# Patient Record
Sex: Male | Born: 1935 | Race: White | Hispanic: No | Smoking: Current every day smoker
Health system: Southern US, Community
[De-identification: ages and names within clinical notes are randomized; demographics above are authoritative.]

## PROBLEM LIST (undated history)

## (undated) DIAGNOSIS — N184 Chronic kidney disease, stage 4 (severe): Secondary | ICD-10-CM

## (undated) DIAGNOSIS — I10 Essential (primary) hypertension: Secondary | ICD-10-CM

## (undated) DIAGNOSIS — N289 Disorder of kidney and ureter, unspecified: Secondary | ICD-10-CM

## (undated) DIAGNOSIS — J449 Chronic obstructive pulmonary disease, unspecified: Secondary | ICD-10-CM

## (undated) DIAGNOSIS — I739 Peripheral vascular disease, unspecified: Secondary | ICD-10-CM

## (undated) HISTORY — DX: Peripheral vascular disease, unspecified: I73.9

## (undated) HISTORY — DX: Chronic obstructive pulmonary disease, unspecified: J44.9

## (undated) HISTORY — DX: Essential (primary) hypertension: I10

---

## 2008-10-10 ENCOUNTER — Ambulatory Visit: Payer: Self-pay | Admitting: Surgery

## 2009-02-19 ENCOUNTER — Emergency Department: Payer: Self-pay | Admitting: Emergency Medicine

## 2009-02-26 ENCOUNTER — Ambulatory Visit: Payer: Self-pay | Admitting: Surgery

## 2009-03-05 ENCOUNTER — Ambulatory Visit: Payer: Self-pay | Admitting: Surgery

## 2009-12-08 ENCOUNTER — Ambulatory Visit: Payer: Self-pay | Admitting: Unknown Physician Specialty

## 2010-11-27 ENCOUNTER — Ambulatory Visit: Payer: Self-pay | Admitting: Ophthalmology

## 2010-12-10 ENCOUNTER — Ambulatory Visit: Payer: Self-pay | Admitting: Ophthalmology

## 2012-11-15 ENCOUNTER — Ambulatory Visit: Payer: Self-pay | Admitting: Family Medicine

## 2013-01-19 ENCOUNTER — Observation Stay: Payer: Self-pay | Admitting: Family Medicine

## 2013-01-19 LAB — CBC
HCT: 31.6 % — ABNORMAL LOW (ref 40.0–52.0)
HGB: 11 g/dL — ABNORMAL LOW (ref 13.0–18.0)
MCHC: 34.7 g/dL (ref 32.0–36.0)
MCV: 91 fL (ref 80–100)
RDW: 12.6 % (ref 11.5–14.5)

## 2013-01-19 LAB — TROPONIN I: Troponin-I: <0.02 ng/mL

## 2013-01-19 LAB — BASIC METABOLIC PANEL
Anion Gap: 8 (ref 7–16)
Calcium, Total: 8.9 mg/dL (ref 8.5–10.1)
Co2: 25 mmol/L (ref 21–32)
Creatinine: 2.32 mg/dL — ABNORMAL HIGH (ref 0.60–1.30)
EGFR (African American): 30 — ABNORMAL LOW
Osmolality: 267 (ref 275–301)
Potassium: 4.6 mmol/L (ref 3.5–5.1)
Sodium: 130 mmol/L — ABNORMAL LOW (ref 136–145)

## 2013-01-19 LAB — PRO B NATRIURETIC PEPTIDE: B-Type Natriuretic Peptide: 299 pg/mL (ref 0–450)

## 2013-01-20 LAB — BASIC METABOLIC PANEL
BUN: 35 mg/dL — ABNORMAL HIGH (ref 7–18)
Calcium, Total: 8.8 mg/dL (ref 8.5–10.1)
Chloride: 99 mmol/L (ref 98–107)
Creatinine: 2.38 mg/dL — ABNORMAL HIGH (ref 0.60–1.30)
EGFR (African American): 29 — ABNORMAL LOW
EGFR (Non-African Amer.): 25 — ABNORMAL LOW
Potassium: 4.8 mmol/L (ref 3.5–5.1)
Sodium: 131 mmol/L — ABNORMAL LOW (ref 136–145)

## 2013-01-20 LAB — CBC WITH DIFFERENTIAL/PLATELET
Basophil %: 0.6 %
HGB: 10.8 g/dL — ABNORMAL LOW (ref 13.0–18.0)
MCHC: 33.8 g/dL (ref 32.0–36.0)
Monocyte %: 0.8 %
Neutrophil #: 6.6 10*3/uL — ABNORMAL HIGH (ref 1.4–6.5)
Platelet: 180 10*3/uL (ref 150–440)
RBC: 3.51 10*6/uL — ABNORMAL LOW (ref 4.40–5.90)
WBC: 7.2 10*3/uL (ref 3.8–10.6)

## 2013-01-20 LAB — TROPONIN I
Troponin-I: <0.02 ng/mL
Troponin-I: <0.02 ng/mL

## 2013-01-20 LAB — LIPID PANEL
HDL Cholesterol: 46 mg/dL (ref 40–60)
Ldl Cholesterol, Calc: 75 mg/dL (ref 0–100)
Triglycerides: 62 mg/dL (ref 0–200)
VLDL Cholesterol, Calc: 12 mg/dL (ref 5–40)

## 2013-01-20 LAB — SODIUM, URINE, RANDOM: Sodium, Urine Random: 99 mmol/L (ref 20–110)

## 2014-11-23 NOTE — H&P (Signed)
PATIENT NAME:  Max Ellis, Max Ellis MR#:  [id number] DATE OF BIRTH:  January 30, 1934  DATE OF ADMISSION:  01/19/2013  PRIMARY CARE PHYSICIAN:  Irven Easterly. Kary Kos, MD  CHIEF COMPLAINT:  Chest pain.   HISTORY OF PRESENT ILLNESS:  This is a 81 year old man who presents to the ER with chest pain going on every night around 4:00 p.m. as a trembling-type feeling in his chest. Last night, it was very severe in nature, 10 out of 10 in intensity. He has been trying to take some Maalox that did not help. He has not been eating much. His legs are more swollen. He has been sweating. In the ER, he had a troponin that was negative. Hospitalist services were contacted for further evaluation.   PAST MEDICAL HISTORY:  Chronic kidney disease, stage III, edema, hypertension, glaucoma, chronic back pain.   PAST SURGICAL HISTORY:  Two hernia repairs.   ALLERGIES:  No known drug allergies.   MEDICATIONS:  Include Xalatan 1 drop each eye at bedtime, Artificial Tears p.r.n., atenolol 25 mg at bedtime, Lasix 20 mg daily, Flomax 0.4 mg daily, Tylenol extra strength p.r.n., vitamin D2 50,000 units 1 capsule every month.   SOCIAL HISTORY:  Smokes 1 pack per day since age 42. No alcohol. No drug use. Retired.   FAMILY HISTORY:  Father died of a CVA and COPD. Mother died of congestive heart failure.   REVIEW OF SYSTEMS:  CONSTITUTIONAL: Positive for sweating. Positive for a few pound weight gain. No fever or chills. Positive for fatigue.  EYES: He does wear glasses and has glaucoma.  EARS, NOSE, MOUTH AND THROAT: Positive for runny nose. No sore throat. No difficulty swallowing.  CARDIOVASCULAR: Positive for chest pain.  RESPIRATORY: Positive for shortness of breath, coughing up yellowish phlegm.  GASTROINTESTINAL: Positive for constipation. No nausea. No vomiting. No abdominal pain.  GENITOURINARY: Positive for a urine infection a few weeks ago.  MUSCULOSKELETAL: No joint pain or muscle pain.  INTEGUMENT: No rashes or eruptions.   NEUROLOGIC: No fainting or blackouts.  PSYCHIATRIC: No anxiety or depression.  ENDOCRINE: No thyroid problems.  HEMATOLOGIC/LYMPHATIC: No anemia.   PHYSICAL EXAMINATION: VITAL SIGNS: Temperature 98.3, pulse 56, respirations 18, blood pressure 155/60, pulse oximetry 98% on room air.  GENERAL: No respiratory distress.  EYES: Conjunctivae and lids normal. Pupils equal, round, and reactive to light. Extraocular muscles intact. No nystagmus.  EARS, NOSE, MOUTH AND THROAT: Tympanic membranes: No erythema. Nasal mucosa: No erythema. Throat: No erythema. No exudate seen. Lips and gums: No lesions.  NECK: No JVD. No bruits. No lymphadenopathy. No thyromegaly. No thyroid nodules palpated.  RESPIRATORY: Decreased breath sounds bilaterally. Positive wheeze throughout entire lung field. No use of accessory muscles to breathe.  CARDIOVASCULAR: S1, S2 soft. No gallops, rubs or murmurs heard. Carotid upstroke 2+ bilaterally. No bruits.  EXTREMITIES: Dorsalis pedis pulses 1+ bilaterally, 3+ edema bilateral lower extremities.  ABDOMEN: Soft, nontender. No organosplenomegaly. Normoactive bowel sounds. No masses felt.  CHEST WALL: Positive pain to palpation over the xiphoid process.  LYMPHATIC: No lymph nodes in the neck.  MUSCULOSKELETAL: With 3+ edema. No clubbing. No cyanosis.  SKIN: Chronic lower extremity discoloration of bilateral lower extremities.  NEUROLOGICAL: Cranial nerves II through XII grossly intact. Deep tendon reflexes 2+ bilateral lower extremities.  PSYCHIATRIC: The patient is oriented to person, place and time.   LABORATORY AND RADIOLOGICAL DATA:  White blood cell count 9.7, H and H 11.0 and 31.6, platelet count 188. Glucose is 86, BUN 31, creatinine 2.32, sodium  130, potassium 4.6, chloride 97, CO2 is 25, calcium 8.9. Liver function tests are not done. GFR is 26. Troponin is negative. BNP is 299. EKG: Normal sinus rhythm, 78 beats per minute.   ASSESSMENT AND PLAN:   1.  Chest pain. We  will admit as an observation, get serial cardiac enzymes to rule out myocardial infarction and will get an echocardiogram. I will hold off on a stress test at this point. I think the patient's chest pain is secondary to his xiphoid process. We will give a trial of Solu-Medrol and reevaluate tomorrow.  2.  Chronic obstructive pulmonary disease exacerbation and tobacco abuse. Smoking cessation counseling done 3 minutes by me. Nicotine patch applied. We will give a dose of Solu-Medrol 125 mg intravenously STAT and 40 mg intravenously q. 8 hours, start DuoNeb nebulizer solution and Advair and reevaluate things tomorrow.  3.  Edema of the lower extremity. I do not believe that this is heart failure, but I will obtain an echocardiogram. His Lasix dose is too small for his creatinine. We will give a dose of intravenous Lasix now and increase his dose to 40 mg daily.  4.  Insomnia. We will give a trial of Remeron.  5.  Chronic kidney disease, stage IV. Continue to monitor as outpatient.  6.  Hyponatremia. We will recheck again tomorrow a.m. and send off a urine random sodium.   TIME SPENT ON ADMISSION:  55 minutes.    ____________________________ Tana Conch. Leslye Peer, MD rjw:si D: 01/19/2013 21:13:00 ET T: 01/19/2013 21:34:00 ET JOB#: [id number]  cc: Tana Conch. Leslye Peer, MD, <Dictator> Marisue Brooklyn MD ELECTRONICALLY SIGNED 01/30/2013 15:31

## 2014-11-23 NOTE — Discharge Summary (Signed)
PATIENT NAME:  Max Ellis, Max Ellis MR#:  [id number] DATE OF BIRTH:  12/27/1933  DATE OF ADMISSION:  01/19/2013 DATE OF DISCHARGE:  01/20/2013  ADMISSION DIAGNOSIS: Chest pain, mostly related to xiphoid process tenderness.   DISCHARGE  DIAGNOSES.  1.  Atypical chest pain.  2.  History of chronic obstructive pulmonary disease.  3.  Current smoker.  4.  Chronic kidney disease.  5.  Glaucoma.  6.  Blind in the left eye.  7.  Hypertension.  8.  History of headaches.  9.  Benign prostatic hypertrophy.  10.  Edema.   HOSPITAL COURSE: The patient is a very nice 81 year old gentleman who has a history of chronic kidney disease, stage III, chronic edema due to chronic kidney disease, hypertension, glaucoma, chronic back pain. He came on 01/19/2013 with a chief complaint of going every night around 4 p.m. with significant chest pain with a trembling feeling over chest, and his pain has been okay, tolerable up until the day of admission. The patient had severe 10 out of 10 pain. He was trying to take some Maalox and it did not help. He was not eating much. His legs were having significant edema because he was not taking his medications right. He had an evaluation showing negative troponin.   His pain was completely reproducible with mobilization of the xiphoid process. The patient was admitted for observation. Cardiac enzymes were done to rule out a myocardial infarction. They were negative.   Overall, results showed a glucose of 140, a creatinine of 2.38, which is baseline for him. Sodium of 131, which is due to his chronic kidney disease. Troponins were negative x 3. Hemoglobin was 11. White count was 9.7.   EKG: Normal sinus rhythm with no significant ST elevation or depression other than mostly depolarization changes in comparison with previous EKGs.   The patient was given steroids and his pain relieved completely. Apparently he has not been sleeping very well due to cough and several other issues, for  which the patient was given Remeron the day of admission. Remeron did not help very much, for which I recommended starting with Benadryl at home.   He has significant cramps of his lower extremities. His potassium has been on the side of 4.5, 4.8. He has been told by Dr. Juleen China to have a low-potassium diet. I told him that if he is going to be taking Lasix, and with increase of the dose he might have mustard or a banana occasionally if he is having severe cramps.   I asked him also to follow-up with Dr. Juleen China for that. His Lasix was increased due to his severe edema to 40 mg once a day instead of 20.  He has also been given a prescription for Advair as he does not take any inhalers for COPD. He is a smoker, and smoking cessation counseling was given to him for over 5 minutes.   The patient was discharged in good condition. Steroids did help, for which we have him a prescription for 3 days of prednisone. His chest pain was overall musculoskeletal.   The patient is to follow up with Dr. Kary Kos and Dr. Juleen China.   I spent about 45 minutes with this patient on discharge   ____________________________ Easton Sink, MD rsg:dm D: 01/22/2013 07:07:16 ET T: 01/22/2013 11:12:42 ET JOB#: [id number]  cc:  Sink, MD, <Dictator> Irven Easterly. Kary Kos, MD Mamie Levers, MD [place] MD ELECTRONICALLY SIGNED 01/30/2013 6:42

## 2016-06-09 ENCOUNTER — Other Ambulatory Visit: Payer: Self-pay | Admitting: Family Medicine

## 2016-06-09 DIAGNOSIS — R1031 Right lower quadrant pain: Secondary | ICD-10-CM

## 2016-06-22 ENCOUNTER — Ambulatory Visit
Admission: RE | Admit: 2016-06-22 | Discharge: 2016-06-22 | Disposition: A | Discharge status: Home / Self Care | Payer: Medicare Other | Source: Ambulatory Visit | Attending: Family Medicine | Admitting: Family Medicine

## 2016-06-22 DIAGNOSIS — N400 Enlarged prostate without lower urinary tract symptoms: Secondary | ICD-10-CM | POA: Diagnosis not present

## 2016-06-22 DIAGNOSIS — R1031 Right lower quadrant pain: Secondary | ICD-10-CM | POA: Insufficient documentation

## 2016-06-22 DIAGNOSIS — N329 Bladder disorder, unspecified: Secondary | ICD-10-CM | POA: Diagnosis not present

## 2016-10-21 ENCOUNTER — Ambulatory Visit: Payer: Self-pay

## 2017-01-26 ENCOUNTER — Ambulatory Visit (INDEPENDENT_AMBULATORY_CARE_PROVIDER_SITE_OTHER): Payer: Medicare Other | Admitting: Vascular Surgery

## 2017-01-26 ENCOUNTER — Encounter (INDEPENDENT_AMBULATORY_CARE_PROVIDER_SITE_OTHER): Payer: Self-pay | Admitting: Vascular Surgery

## 2017-01-26 VITALS — BP 140/72 | HR 63 | Resp 15 | Ht 74.0 in | Wt 159.0 lb

## 2017-01-26 DIAGNOSIS — F17200 Nicotine dependence, unspecified, uncomplicated: Secondary | ICD-10-CM | POA: Insufficient documentation

## 2017-01-26 DIAGNOSIS — F17210 Nicotine dependence, cigarettes, uncomplicated: Secondary | ICD-10-CM | POA: Diagnosis not present

## 2017-01-26 DIAGNOSIS — M79604 Pain in right leg: Secondary | ICD-10-CM | POA: Diagnosis not present

## 2017-01-26 DIAGNOSIS — M79605 Pain in left leg: Secondary | ICD-10-CM

## 2017-01-26 DIAGNOSIS — I10 Essential (primary) hypertension: Secondary | ICD-10-CM | POA: Insufficient documentation

## 2017-01-26 DIAGNOSIS — M79609 Pain in unspecified limb: Secondary | ICD-10-CM | POA: Insufficient documentation

## 2017-01-26 NOTE — Patient Instructions (Signed)
Peripheral Vascular Disease Peripheral vascular disease (PVD) is a disease of the blood vessels that are not part of your heart and brain. A simple term for PVD is poor circulation. In most cases, PVD narrows the blood vessels that carry blood from your heart to the rest of your body. This can result in a decreased supply of blood to your arms, legs, and internal organs, like your stomach or kidneys. However, it most often affects a person's lower legs and feet. There are two types of PVD.  Organic PVD. This is the more common type. It is caused by damage to the structure of blood vessels.  Functional PVD. This is caused by conditions that make blood vessels contract and tighten (spasm).  Without treatment, PVD tends to get worse over time. PVD can also lead to acute ischemic limb. This is when an arm or limb suddenly has trouble getting enough blood. This is a medical emergency. What are the causes? Each type of PVD has many different causes. The most common cause of PVD is buildup of a fatty material (plaque) inside of your arteries (atherosclerosis). Small amounts of plaque can break off from the walls of the blood vessels and become lodged in a smaller artery. This blocks blood flow and can cause acute ischemic limb. Other common causes of PVD include:  Blood clots that form inside of blood vessels.  Injuries to blood vessels.  Diseases that cause inflammation of blood vessels or cause blood vessel spasms.  Health behaviors and health history that increase your risk of developing PVD.  What increases the risk? You may have a greater risk of PVD if you:  Have a family history of PVD.  Have certain medical conditions, including: ? High cholesterol. ? Diabetes. ? High blood pressure (hypertension). ? Coronary heart disease. ? Past problems with blood clots. ? Past injury, such as burns or a broken bone. These may have damaged blood vessels in your limbs. ? Buerger disease. This is  caused by inflamed blood vessels in your hands and feet. ? Some forms of arthritis. ? Rare birth defects that affect the arteries in your legs.  Use tobacco.  Do not get enough exercise.  Are obese.  Are age 50 or older.  What are the signs or symptoms? PVD may cause many different symptoms. Your symptoms depend on what part of your body is not getting enough blood. Some common signs and symptoms include:  Cramps in your lower legs. This may be a symptom of poor leg circulation (claudication).  Pain and weakness in your legs while you are physically active that goes away when you rest (intermittent claudication).  Leg pain when at rest.  Leg numbness, tingling, or weakness.  Coldness in a leg or foot, especially when compared with the other leg.  Skin or hair changes. These can include: ? Hair loss. ? Shiny skin. ? Pale or bluish skin. ? Thick toenails.  Inability to get or maintain an erection (erectile dysfunction).  People with PVD are more prone to developing ulcers and sores on their toes, feet, or legs. These may take longer than normal to heal. How is this diagnosed? Your health care provider may diagnose PVD from your signs and symptoms. The health care provider will also do a physical exam. You may have tests to find out what is causing your PVD and determine its severity. Tests may include:  Blood pressure recordings from your arms and legs and measurements of the strength of your pulses (  pulse volume recordings).  Imaging studies using sound waves to take pictures of the blood flow through your blood vessels (Doppler ultrasound).  Injecting a dye into your blood vessels before having imaging studies using: ? X-rays (angiogram or arteriogram). ? Computer-generated X-rays (CT angiogram). ? A powerful electromagnetic field and a computer (magnetic resonance angiogram or MRA).  How is this treated? Treatment for PVD depends on the cause of your condition and the  severity of your symptoms. It also depends on your age. Underlying causes need to be treated and controlled. These include long-lasting (chronic) conditions, such as diabetes, high cholesterol, and high blood pressure. You may need to first try making lifestyle changes and taking medicines. Surgery may be needed if these do not work. Lifestyle changes may include:  Quitting smoking.  Exercising regularly.  Following a low-fat, low-cholesterol diet.  Medicines may include:  Blood thinners to prevent blood clots.  Medicines to improve blood flow.  Medicines to improve your blood cholesterol levels.  Surgical procedures may include:  A procedure that uses an inflated balloon to open a blocked artery and improve blood flow (angioplasty).  A procedure to put in a tube (stent) to keep a blocked artery open (stent implant).  Surgery to reroute blood flow around a blocked artery (peripheral bypass surgery).  Surgery to remove dead tissue from an infected wound on the affected limb.  Amputation. This is surgical removal of the affected limb. This may be necessary in cases of acute ischemic limb that are not improved through medical or surgical treatments.  Follow these instructions at home:  Take medicines only as directed by your health care provider.  Do not use any tobacco products, including cigarettes, chewing tobacco, or electronic cigarettes. If you need help quitting, ask your health care provider.  Lose weight if you are overweight, and maintain a healthy weight as directed by your health care provider.  Eat a diet that is low in fat and cholesterol. If you need help, ask your health care provider.  Exercise regularly. Ask your health care provider to suggest some good activities for you.  Use compression stockings or other mechanical devices as directed by your health care provider.  Take good care of your feet. ? Wear comfortable shoes that fit well. ? Check your feet  often for any cuts or sores. Contact a health care provider if:  You have cramps in your legs while walking.  You have leg pain when you are at rest.  You have coldness in a leg or foot.  Your skin changes.  You have erectile dysfunction.  You have cuts or sores on your feet that are not healing. Get help right away if:  Your arm or leg turns cold and blue.  Your arms or legs become red, warm, swollen, painful, or numb.  You have chest pain or trouble breathing.  You suddenly have weakness in your face, arm, or leg.  You become very confused or lose the ability to speak.  You suddenly have a very bad headache or lose your vision. This information is not intended to replace advice given to you by your health care provider. Make sure you discuss any questions you have with your health care provider. Document Released: 08/27/2004 Document Revised: 12/26/2015 Document Reviewed: 12/28/2013 Elsevier Interactive Patient Education  2017 [employer]  

## 2017-01-26 NOTE — Progress Notes (Signed)
Patient ID: Max Ellis, male   DOB: 05-06-1934, 83 y.o.   MRN: [id number]  Chief Complaint  Patient presents with  . New Evaluation    Bilateral leg Ellis    HPI Max Ellis is a 83 y.o. male.  I am asked to see the patient by Dr. Kary Kos for evaluation of leg Ellis.  The patient reports Ellis in his legs that makes him unable to walk more than about 50-75 feet. Both lower extremities are affected. He says that at times they ache and burn and only dangling them off of the bed or chair will help. He says he has to sit down and rest after only short distances of walking. This has been gradually worsening over months without a clear inciting event or causative factor. He has previously been checked but it has been about 40 years ago or so as best he can tell. He has multiple atherosclerotic risk factors including ongoing tobacco use. He says the legs are affected about the same in terms of the left and the right. The Ellis is predominantly in the calf and lower leg area.   Past Medical History:  Diagnosis Date  . COPD (chronic obstructive pulmonary disease) ([place])   . Hypertension   . Peripheral vascular disease ([place])     No past surgical history on file.  Family History No bleeding disorders, clotting disorders, autoimmune diseases, or aneurysms  Social History Social History  Substance Use Topics  . Smoking status: Current Every Day Smoker  . Smokeless tobacco: Never Used  . Alcohol use No  No IVDU  No Known Allergies  Current Outpatient Prescriptions  Medication Sig Dispense Refill  . amLODipine (NORVASC) 2.5 MG tablet Take by mouth.    Marland Kitchen aspirin EC 81 MG tablet Take by mouth.    . calcitRIOL (ROCALTROL) 0.25 MCG capsule TAKE 1 CAPSULE BY MOUTH THREE TIMES A WEEK TAKE MONDAY, WEDNESDAY AND FRIDAY  6  . doxycycline (VIBRAMYCIN) 100 MG capsule Take by mouth.    . finasteride (PROSCAR) 5 MG tablet Take 5 mg by mouth daily.  11  . Fluticasone-Salmeterol (ADVAIR DISKUS) 250-50  MCG/DOSE AEPB Inhale into the lungs.    . furosemide (LASIX) 40 MG tablet Take 40 mg by mouth 2 (two) times daily.  6  . tamsulosin (FLOMAX) 0.4 MG CAPS capsule TAKE 1 CAPSULE BY MOUTH ONCE DAILY. TAKE 30 MINUTES AFTER SAME MEAL EACH DAY.    . traZODone (DESYREL) 50 MG tablet Take by mouth.    . Vitamin D, Ergocalciferol, (DRISDOL) 50000 units CAPS capsule Take by mouth.     No current facility-administered medications for this visit.       REVIEW OF SYSTEMS (Negative unless checked)  Constitutional: [] Weight loss  [] Fever  [] Chills Cardiac: [] Chest Ellis   [] Chest pressure   [] Palpitations   [] Shortness of breath when laying flat   [] Shortness of breath at rest   [] Shortness of breath with exertion. Vascular:  [x] Ellis in legs with walking   [] Ellis in legs at rest   [] Ellis in legs when laying flat   [x] Claudication   [] Ellis in feet when walking  [] Ellis in feet at rest  [] Ellis in feet when laying flat   [] History of DVT   [] Phlebitis   [] Swelling in legs   [] Varicose veins   [] Non-healing ulcers Pulmonary:   [] Uses home oxygen   [] Productive cough   [] Hemoptysis   [] Wheeze  [x] COPD   [] Asthma Neurologic:  [] Dizziness  []   Blackouts   [] Seizures   [] History of stroke   [] History of TIA  [] Aphasia   [] Temporary blindness   [] Dysphagia   [] Weakness or numbness in arms   [] Weakness or numbness in legs Musculoskeletal:  [x] Arthritis   [] Joint swelling   [] Joint Ellis   [x] Low back Ellis Hematologic:  [] Easy bruising  [] Easy bleeding   [] Hypercoagulable state   [] Anemic  [] Hepatitis Gastrointestinal:  [] Blood in stool   [] Vomiting blood  [] Gastroesophageal reflux/heartburn   [] Abdominal Ellis Genitourinary:  [x] Chronic kidney disease   [] Difficult urination  [] Frequent urination  [] Burning with urination   [] Hematuria Skin:  [] Rashes   [] Ulcers   [] Wounds Psychological:  [] History of anxiety   []  History of major depression.    Physical Exam BP 140/72 (BP Location: Right Arm)   Pulse 63   Resp 15    Ht 6\' 2"  (1.88 m)   Wt 159 lb (72.1 kg)   BMI 20.41 kg/m  Gen:  WD/WN, NAD Head: [place]/AT, No temporalis wasting. Prominent temp pulse not noted. Ear/Nose/Throat: Hearing grossly intact, nares w/o erythema or drainage, oropharynx w/o Erythema/Exudate Eyes: Conjunctiva clear, sclera non-icteric  Neck: trachea midline.  No JVD.  Pulmonary:  Good air movement, clear to auscultation bilaterally.  Cardiac: RRR, normal S1, S2, no Murmurs, rubs or gallops. Vascular:  Vessel Right Left  Radial Palpable Palpable                      Popliteal Not Palpable Not Palpable  PT Trace Palpable 1+ Palpable  DP Trace Palpable Not Palpable   Gastrointestinal: soft, non-tender/non-distended.  Musculoskeletal: M/S 5/5 throughout.   No deformity or atrophy. Feet are warm. Trace lower extremity edema. Neurologic: Sensation grossly intact in extremities.  Symmetrical.  Speech is fluent. Motor exam as listed above. Psychiatric: Judgment intact, Mood & affect appropriate for pt's clinical situation. Dermatologic: No rashes or ulcers noted.  No cellulitis or open wounds.    Radiology No results found.  Labs No results found for this or any previous visit (from the past 2160 hour(s)).  Assessment/Plan:  Essential hypertension blood pressure control important in reducing the progression of atherosclerotic disease. On appropriate oral medications.   Tobacco use disorder We had a discussion for approximately 3-4 minutes regarding the absolute need for smoking cessation due to the deleterious nature of tobacco on the vascular system. We discussed the tobacco use would diminish patency of any intervention, and likely significantly worsen progressio of disease. We discussed multiple agents for quitting including replacement therapy or medications to reduce cravings such as Chantix. The patient voices their understanding of the importance of smoking cessation.   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  Recommend:  Patient should undergo arterial studies of the lower extremity ASAP because there has been a significant deterioration in the patient's lower extremity symptoms.  The patient states they are having increased Ellis and a marked decrease in the distance that they can walk.  The risks and benefits as well as the alternatives were discussed in detail with the patient.  All questions were answered.  Patient agrees to proceed and understands this could be a prelude to angiography and intervention.  The patient will follow up with me in the office to review the studies.       Max Ellis 01/26/2017, 4:31  PM   This note was created with Dragon medical transcription system.  Any errors from dictation are unintentional.

## 2017-01-26 NOTE — Assessment & Plan Note (Signed)
The patient has lower extremity symptoms consistent with short distance claudication although he does have a history of back disease and neurogenic claudication certainly has to be a possibility as well.  Recommend:  Patient should undergo arterial studies of the lower extremity ASAP because there has been a significant deterioration in the patient's lower extremity symptoms.  The patient states they are having increased pain and a marked decrease in the distance that they can walk.  The risks and benefits as well as the alternatives were discussed in detail with the patient.  All questions were answered.  Patient agrees to proceed and understands this could be a prelude to angiography and intervention.  The patient will follow up with me in the office to review the studies.

## 2017-01-26 NOTE — Assessment & Plan Note (Signed)
blood pressure control important in reducing the progression of atherosclerotic disease. On appropriate oral medications.  

## 2017-01-26 NOTE — Assessment & Plan Note (Signed)
We had a discussion for approximately 3-4 minutes regarding the absolute need for smoking cessation due to the deleterious nature of tobacco on the vascular system. We discussed the tobacco use would diminish patency of any intervention, and likely significantly worsen progressio of disease. We discussed multiple agents for quitting including replacement therapy or medications to reduce cravings such as Chantix. The patient voices their understanding of the importance of smoking cessation.  

## 2017-02-09 ENCOUNTER — Ambulatory Visit (INDEPENDENT_AMBULATORY_CARE_PROVIDER_SITE_OTHER): Payer: Medicare Other | Admitting: Vascular Surgery

## 2017-02-09 ENCOUNTER — Encounter (INDEPENDENT_AMBULATORY_CARE_PROVIDER_SITE_OTHER): Payer: Medicare Other

## 2017-03-03 ENCOUNTER — Ambulatory Visit (INDEPENDENT_AMBULATORY_CARE_PROVIDER_SITE_OTHER): Payer: Medicare Other

## 2017-03-03 DIAGNOSIS — M79605 Pain in left leg: Secondary | ICD-10-CM

## 2017-03-03 DIAGNOSIS — M79604 Pain in right leg: Secondary | ICD-10-CM | POA: Diagnosis not present

## 2017-03-04 ENCOUNTER — Encounter (INDEPENDENT_AMBULATORY_CARE_PROVIDER_SITE_OTHER): Payer: Self-pay | Admitting: Vascular Surgery

## 2017-06-09 ENCOUNTER — Encounter: Payer: Self-pay | Admitting: *Deleted

## 2017-06-09 ENCOUNTER — Emergency Department: Payer: Medicare Other

## 2017-06-09 ENCOUNTER — Emergency Department
Admission: EM | Admit: 2017-06-09 | Discharge: 2017-06-09 | Disposition: A | Discharge status: Home / Self Care | Payer: Medicare Other | Attending: Emergency Medicine | Admitting: Emergency Medicine

## 2017-06-09 DIAGNOSIS — K5900 Constipation, unspecified: Secondary | ICD-10-CM | POA: Diagnosis not present

## 2017-06-09 DIAGNOSIS — J449 Chronic obstructive pulmonary disease, unspecified: Secondary | ICD-10-CM | POA: Diagnosis not present

## 2017-06-09 DIAGNOSIS — I10 Essential (primary) hypertension: Secondary | ICD-10-CM | POA: Insufficient documentation

## 2017-06-09 DIAGNOSIS — R059 Cough, unspecified: Secondary | ICD-10-CM

## 2017-06-09 DIAGNOSIS — F17200 Nicotine dependence, unspecified, uncomplicated: Secondary | ICD-10-CM | POA: Insufficient documentation

## 2017-06-09 DIAGNOSIS — J189 Pneumonia, unspecified organism: Secondary | ICD-10-CM | POA: Insufficient documentation

## 2017-06-09 DIAGNOSIS — J181 Lobar pneumonia, unspecified organism: Secondary | ICD-10-CM

## 2017-06-09 DIAGNOSIS — R339 Retention of urine, unspecified: Secondary | ICD-10-CM | POA: Diagnosis not present

## 2017-06-09 DIAGNOSIS — R05 Cough: Secondary | ICD-10-CM

## 2017-06-09 LAB — URINALYSIS, COMPLETE (UACMP) WITH MICROSCOPIC
BACTERIA UA: NONE SEEN
Bilirubin Urine: NEGATIVE
GLUCOSE, UA: NEGATIVE mg/dL
HGB URINE DIPSTICK: NEGATIVE
Ketones, ur: NEGATIVE mg/dL
Leukocytes, UA: NEGATIVE
NITRITE: NEGATIVE
Protein, ur: NEGATIVE mg/dL
SPECIFIC GRAVITY, URINE: 1.006 (ref 1.005–1.030)
Squamous Epithelial / LPF: NONE SEEN
WBC, UA: NONE SEEN WBC/hpf (ref 0–5)
pH: 5 (ref 5.0–8.0)

## 2017-06-09 LAB — CBC
HCT: 34.2 % — ABNORMAL LOW (ref 40.0–52.0)
Hemoglobin: 11.6 g/dL — ABNORMAL LOW (ref 13.0–18.0)
MCH: 30.8 pg (ref 26.0–34.0)
MCHC: 33.8 g/dL (ref 32.0–36.0)
MCV: 91.2 fL (ref 80.0–100.0)
Platelets: 150 10*3/uL (ref 150–440)
RBC: 3.75 MIL/uL — ABNORMAL LOW (ref 4.40–5.90)
RDW: 13.3 % (ref 11.5–14.5)
WBC: 8.1 10*3/uL (ref 3.8–10.6)

## 2017-06-09 LAB — BASIC METABOLIC PANEL
Anion gap: 10 (ref 5–15)
BUN: 35 mg/dL — AB (ref 6–20)
CALCIUM: 8.6 mg/dL — AB (ref 8.9–10.3)
CO2: 21 mmol/L — ABNORMAL LOW (ref 22–32)
Chloride: 99 mmol/L — ABNORMAL LOW (ref 101–111)
Creatinine, Ser: 2.48 mg/dL — ABNORMAL HIGH (ref 0.61–1.24)
GFR calc Af Amer: 26 mL/min — ABNORMAL LOW (ref >60)
GFR, EST NON AFRICAN AMERICAN: 23 mL/min — AB (ref >60)
GLUCOSE: 99 mg/dL (ref 65–99)
Potassium: 4.2 mmol/L (ref 3.5–5.1)
Sodium: 130 mmol/L — ABNORMAL LOW (ref 135–145)

## 2017-06-09 MED ORDER — AZITHROMYCIN 250 MG PO TABS: ORAL_TABLET | ORAL | 0 refills | Status: AC

## 2017-06-09 NOTE — ED Triage Notes (Addendum)
States feeling constipated, last BM Friday, took laxative last night with no relief, family also states they feel he may be dehydrated, pt has productive cough with clear sputum, saw PCP on Monday and was given mucinex, pt awake and alert in no acute distress

## 2017-06-09 NOTE — ED Notes (Signed)
NAD noted at time of D/C. Pt denies questions or concerns. Pt ambulatory to the lobby at this time.  

## 2017-06-09 NOTE — ED Notes (Signed)
[facility], EDT performed bladder scan showing 848ml of fluid in bladder

## 2017-06-09 NOTE — ED Provider Notes (Signed)
[facility] Emergency Department Provider Note  Time seen: 2:10 PM  I have reviewed the triage vital signs and the nursing notes.   HISTORY  Chief Complaint Constipation    HPI Max Ellis is a 83 y.o. male with a past medical history of COPD, hypertension presents to the emergency department with the inability to urinate.  According to the patient for the past 1 week or so he has been experiencing a cough, saw his primary care doctor several days ago and was prescribed Mucinex as well as Therapist, sports.  He states since yesterday he has not been able to urinate.  He also states for the past 5 days or so he has been constipated.  Patient's main complaint and coming to the emergency department however is the inability to urinate with moderate to significant lower abdominal pain.  Patient denies any dysuria but states it hurts in his lower abdomen when he attempts to urinate.  Denies hematuria.  Denies fever.  Past Medical History:  Diagnosis Date  . COPD (chronic obstructive pulmonary disease) ([place])   . Hypertension   . Peripheral vascular disease [facility])     Patient Active Problem List   Diagnosis Date Noted  . Essential hypertension 01/26/2017  . Tobacco use disorder 01/26/2017  . Pain in limb 01/26/2017    History reviewed. No pertinent surgical history.  Prior to Admission medications   Medication Sig Start Date End Date Taking? Authorizing Provider  amLODipine (NORVASC) 2.5 MG tablet Take by mouth.    [provider]  aspirin EC 81 MG tablet Take by mouth.    [provider]  calcitRIOL (ROCALTROL) 0.25 MCG capsule TAKE 1 CAPSULE BY MOUTH THREE TIMES A WEEK TAKE MONDAY, [facility] AND FRIDAY 12/30/16   [provider]  doxycycline (VIBRAMYCIN) 100 MG capsule Take by mouth. 11/18/16   [provider]  finasteride (PROSCAR) 5 MG tablet Take 5 mg by mouth daily. 12/30/16   [provider]  Fluticasone-Salmeterol (ADVAIR  DISKUS) 250-50 MCG/DOSE AEPB Inhale into the lungs.    [provider]  furosemide (LASIX) 40 MG tablet Take 40 mg by mouth 2 (two) times daily. 12/30/16   [provider]  tamsulosin (FLOMAX) 0.4 MG CAPS capsule TAKE 1 CAPSULE BY MOUTH ONCE DAILY. TAKE 30 MINUTES AFTER SAME MEAL EACH DAY. 05/25/16   [provider]  traZODone (DESYREL) 50 MG tablet Take by mouth. 01/06/17 01/06/18  [provider]  Vitamin D, Ergocalciferol, (DRISDOL) 50000 units CAPS capsule Take by mouth.    [provider]    No Known Allergies  History reviewed. No pertinent family history.  Social History Social History   Tobacco Use  . Smoking status: Current Every Day Smoker  . Smokeless tobacco: Never Used  Substance Use Topics  . Alcohol use: No  . Drug use: No    Review of Systems Constitutional: Negative for fever Cardiovascular: Negative for chest pain. Respiratory: Negative for shortness of breath. Gastrointestinal: Positive for lower abdominal pain.  Negative for nausea vomiting or diarrhea.  Positive for 5 days of constipation. Genitourinary: Positive for urinary retention/inability to urinate All other ROS negative  ____________________________________________   PHYSICAL EXAM:  VITAL SIGNS: ED Triage Vitals  Enc Vitals Group     BP 06/09/17 1315 (!) 151/71     Pulse Rate 06/09/17 1315 80     Resp 06/09/17 1315 18     Temp 06/09/17 1315 97.9 F (36.6 C)  Temp Source 06/09/17 1315 Oral     SpO2 06/09/17 1315 99 %     Weight 06/09/17 1315 163 lb (73.9 kg)     Height 06/09/17 1315 6\' 2"  (1.88 m)     Head Circumference --      Peak Flow --      Pain Score 06/09/17 1314 5     Pain Loc --      Pain Edu? --      Excl. in [place]? --    Constitutional: Alert and oriented. Well appearing and in no distress. Eyes: Normal exam ENT   Head: Normocephalic and atraumatic.   Mouth/Throat: Mucous membranes are moist. Cardiovascular: Normal rate,  regular rhythm. Respiratory: Normal respiratory effort without tachypnea nor retractions. Breath sounds are clear  Gastrointestinal: Soft, nontender, no distention.  Exam is status post Foley catheter insertion. Musculoskeletal: Nontender with normal range of motion in all extremities. Neurologic:  Normal speech and language. No gross focal neurologic deficits  Skin:  Skin is warm, dry and intact.  Psychiatric: Mood and affect are normal.   ____________________________________________    RADIOLOGY  X-ray shows right upper lobe pneumonia versus mass.  ____________________________________________   INITIAL IMPRESSION / ASSESSMENT AND PLAN / ED COURSE  Pertinent labs & imaging results that were available during my care of the patient were reviewed by me and considered in my medical decision making (see chart for details).  The patient presents to the emergency department for lower abdominal discomfort and inability to urinate since yesterday.  Differential includes acute urinary retention, BPH, urinary tract infection, renal failure/insufficiency.  We will obtain an x-ray of the abdomen, place a Foley catheter and check labs.  Foley catheter had greater than 600 cc output.  Patient's labs show renal insufficiency however when compared to the patient's old records this is largely unchanged.  Urinalysis appears normal.  Patient states complete resolution of discomfort after Foley catheter insertion.  Patient's x-ray shows possible right upper lobe pneumonia versus mass.  We will treat with antibiotics and have the patient follow-up with his doctor in 3 weeks for repeat x-ray imaging.  Patient is agreeable to this plan.  I discussed the plan of care with family and patient they are agreeable.  We will place the patient on a Foley catheter leg bag have him follow-up with urology in 7-10 days for recheck and possible removal.  Patient agreeable to this plan of  care.   ____________________________________________   FINAL CLINICAL IMPRESSION(S) / ED DIAGNOSES  Acute urinary retention Pneumonia   Harvest Dark, MD 06/09/17 1519

## 2017-06-09 NOTE — Discharge Instructions (Signed)
Please take your antibiotics as prescribed.  Please follow-up with your doctor for a repeat chest x-ray in 3 weeks to ensure that the pneumonia has resolved and there is no underlying mass/cancer.  Please follow-up with urology in the next 7-10 days for recheck/reevaluation and Foley catheter removal.

## 2017-06-14 ENCOUNTER — Emergency Department
Admission: EM | Admit: 2017-06-14 | Discharge: 2017-06-14 | Disposition: A | Discharge status: Home / Self Care | Payer: Medicare Other | Attending: Emergency Medicine | Admitting: Emergency Medicine

## 2017-06-14 ENCOUNTER — Encounter: Payer: Self-pay | Admitting: Intensive Care

## 2017-06-14 DIAGNOSIS — J449 Chronic obstructive pulmonary disease, unspecified: Secondary | ICD-10-CM | POA: Diagnosis not present

## 2017-06-14 DIAGNOSIS — F17200 Nicotine dependence, unspecified, uncomplicated: Secondary | ICD-10-CM | POA: Diagnosis not present

## 2017-06-14 DIAGNOSIS — I10 Essential (primary) hypertension: Secondary | ICD-10-CM | POA: Diagnosis not present

## 2017-06-14 DIAGNOSIS — Z7982 Long term (current) use of aspirin: Secondary | ICD-10-CM | POA: Insufficient documentation

## 2017-06-14 DIAGNOSIS — Z79899 Other long term (current) drug therapy: Secondary | ICD-10-CM | POA: Insufficient documentation

## 2017-06-14 DIAGNOSIS — Z466 Encounter for fitting and adjustment of urinary device: Secondary | ICD-10-CM | POA: Diagnosis present

## 2017-06-14 NOTE — ED Notes (Signed)
FIRST NURSE NOTE: catheter leaking. Pt arrives via POV, ambulates with ease. Offered wheelchair, denies.

## 2017-06-14 NOTE — ED Notes (Signed)
Daughter signed for d/c per pt request.

## 2017-06-14 NOTE — ED Notes (Signed)
Pt presenting with dry pants and undergarments, states last changed underwear Friday, denies changing or drying today when asked. Catheter appears intact without signs of trauma or damage. Minimal moisture present to foreskin. Urine clear and yellow. Pt c/o no pain.

## 2017-06-14 NOTE — ED Notes (Signed)
Pt. Verbalizes understanding of d/c instructions and follow-up. Pt. In NAD at time of d/c and denies further concerns regarding this visit. Pt. Stable at the time of departure from the unit, departing unit by the safest and most appropriate manner per that pt condition and limitations with all belongings accounted for. Pt advised to return to the ED at any time for emergent concerns, or for new/worsening symptoms.

## 2017-06-14 NOTE — ED Provider Notes (Signed)
[facility] Emergency Department Provider Note   ____________________________________________   I have reviewed the triage vital signs and the nursing notes.   HISTORY  Chief Complaint No chief complaint on file.    HPI Max Ellis is a 83 y.o. male presents emergency department with urinary catheter.  Patient had a catheter placed on 06/09/2017.  Patient was informed to have the catheter removed in 7 days however, the only appointment available was on 11/19.  Patient's daughters elected to bring him to the emergency department today to have the catheter removed.  Patient and family denied any complications with catheter, denied any leakage and did not noted any blood significant pain or discomfort during the duration of urinary catheter placement. Patient denies fever, chills, headache, vision changes, chest pain, chest tightness, shortness of breath, abdominal pain, nausea and vomiting.  Past Medical History:  Diagnosis Date  . COPD (chronic obstructive pulmonary disease) ([place])   . Hypertension   . Peripheral vascular disease [facility])     Patient Active Problem List   Diagnosis Date Noted  . Essential hypertension 01/26/2017  . Tobacco use disorder 01/26/2017  . Pain in limb 01/26/2017    History reviewed. No pertinent surgical history.  Prior to Admission medications   Medication Sig Start Date End Date Taking? Authorizing Provider  amLODipine (NORVASC) 2.5 MG tablet Take by mouth.    [provider]  aspirin EC 81 MG tablet Take by mouth.    [provider]  azithromycin (ZITHROMAX Z-PAK) 250 MG tablet Take 2 tablets (500 mg) on  Day 1,  followed by 1 tablet (250 mg) once daily on Days 2 through 5. 06/09/17 06/14/17  Harvest Dark, MD  calcitRIOL (ROCALTROL) 0.25 MCG capsule TAKE 1 CAPSULE BY MOUTH THREE TIMES A WEEK TAKE MONDAY, [facility] AND FRIDAY 12/30/16   [provider]  doxycycline (VIBRAMYCIN) 100 MG capsule  Take by mouth. 11/18/16   [provider]  finasteride (PROSCAR) 5 MG tablet Take 5 mg by mouth daily. 12/30/16   [provider]  Fluticasone-Salmeterol (ADVAIR DISKUS) 250-50 MCG/DOSE AEPB Inhale into the lungs.    [provider]  furosemide (LASIX) 40 MG tablet Take 40 mg by mouth 2 (two) times daily. 12/30/16   [provider]  tamsulosin (FLOMAX) 0.4 MG CAPS capsule TAKE 1 CAPSULE BY MOUTH ONCE DAILY. TAKE 30 MINUTES AFTER SAME MEAL EACH DAY. 05/25/16   [provider]  traZODone (DESYREL) 50 MG tablet Take by mouth. 01/06/17 01/06/18  [provider]  Vitamin D, Ergocalciferol, (DRISDOL) 50000 units CAPS capsule Take by mouth.    [provider]    Allergies Patient has no known allergies.  History reviewed. No pertinent family history.  Social History Social History   Tobacco Use  . Smoking status: Current Every Day Smoker  . Smokeless tobacco: Never Used  Substance Use Topics  . Alcohol use: No  . Drug use: No    Review of Systems Constitutional: Negative for fever/chills Cardiovascular: Denies chest pain. Respiratory: Denies shortness of breath. Gastrointestinal: No abdominal pain.  No nausea, vomiting, diarrhea. Genitourinary: Negative for blood, drainage or pain.  Musculoskeletal: Negative for back pain. Skin: Negative for rash. Neurological: Negative for headaches.   ____________________________________________   PHYSICAL EXAM:  VITAL SIGNS: ED Triage Vitals [06/14/17 1241]  Enc Vitals Group     BP 114/62     Pulse Rate 83     Resp 16     Temp 98.1 F (  36.7 C)     Temp Source Oral     SpO2 99 %     Weight 163 lb (73.9 kg)     Height 6\' 2"  (1.88 m)     Head Circumference      Peak Flow      Pain Score      Pain Loc      Pain Edu?      Excl. in [place]?     Constitutional: Alert and oriented. Well appearing and in no acute distress.  Eyes: Conjunctivae are normal. PERRL. Head: Normocephalic and  atraumatic. Cardiovascular: Normal rate, regular rhythm.  Good peripheral circulation. Respiratory: Normal respiratory effort without tachypnea or retractions. Gastrointestinal: Bowel sounds 4 quadrants. Soft and nontender to palpation. No guarding or rigidity. No palpable masses. No distention. Genitourinary: Negative for blood, drainage or pain. No irritation around the urethra opening noten Neurologic: Normal speech and language. Skin:  Skin is warm, dry and intact. No rash noted. Psychiatric: Mood and affect are normal. Speech and behavior are normal. Patient exhibits appropriate insight and judgement.  ____________________________________________   LABS (all labs ordered are listed, but only abnormal results are displayed)  Labs Reviewed - No data to display ____________________________________________  EKG none ____________________________________________  RADIOLOGY none ____________________________________________   PROCEDURES  Procedure(s) performed:  Urinary catheter removed. No complication noted.    Critical Care performed: no ____________________________________________   INITIAL IMPRESSION / ASSESSMENT AND PLAN / ED COURSE  Pertinent labs & imaging results that were available during my care of the patient were reviewed by me and considered in my medical decision making (see chart for details).   Patient presents to emergency department for urinary catheter removal. History, physical exam findings noted no new findings and catheter removed without complication. Patient and family advised to return if patient fails to urinate in 4-6 hours, begins to experience pain or notes drainage or blood from the penis. Patient advised to follow up with PCP as needed or return to the emergency department if symptoms return or worsen. Patient informed of clinical course, understand medical decision-making process, and agree with  plan.  ____________________________________________   FINAL CLINICAL IMPRESSION(S) / ED DIAGNOSES  Final diagnoses:  Urinary catheter insertion/adjustment/removal       NEW MEDICATIONS STARTED DURING THIS VISIT:  This SmartLink is deprecated. Use AVSMEDLIST instead to display the medication list for a patient.   Note:  This document was prepared using Dragon voice recognition software and may include unintentional dictation errors.    Jerolyn Shin, PA-C 06/14/17 1823    Nance Pear, MD 06/15/17 [phone]

## 2017-06-14 NOTE — ED Triage Notes (Addendum)
Patient had catheter put in last Wednesday. Patients grand-daughter reports he was suppose to get catheter taken out in 7 days but was unable to get appointment until 19th and was told to bring pt back to ER to have catheter removed. Denies pain at this time.

## 2017-06-14 NOTE — Discharge Instructions (Signed)
If you do not begin to pass urine over the next 4-6 hours or you notice passing of significant blood do not hesitate to return to the emergency department.

## 2017-06-25 ENCOUNTER — Emergency Department
Admission: EM | Admit: 2017-06-25 | Discharge: 2017-06-25 | Disposition: A | Discharge status: Home / Self Care | Payer: Medicare Other | Attending: Emergency Medicine | Admitting: Emergency Medicine

## 2017-06-25 ENCOUNTER — Other Ambulatory Visit: Payer: Self-pay

## 2017-06-25 ENCOUNTER — Encounter: Payer: Self-pay | Admitting: Emergency Medicine

## 2017-06-25 DIAGNOSIS — I10 Essential (primary) hypertension: Secondary | ICD-10-CM | POA: Diagnosis not present

## 2017-06-25 DIAGNOSIS — R339 Retention of urine, unspecified: Secondary | ICD-10-CM | POA: Diagnosis present

## 2017-06-25 DIAGNOSIS — F17200 Nicotine dependence, unspecified, uncomplicated: Secondary | ICD-10-CM | POA: Diagnosis not present

## 2017-06-25 DIAGNOSIS — R338 Other retention of urine: Secondary | ICD-10-CM | POA: Diagnosis not present

## 2017-06-25 DIAGNOSIS — J449 Chronic obstructive pulmonary disease, unspecified: Secondary | ICD-10-CM | POA: Diagnosis not present

## 2017-06-25 NOTE — ED Provider Notes (Signed)
[facility] Emergency Department Provider Note       Time seen: ----------------------------------------- 11:54 AM on 06/25/2017 -----------------------------------------   I have reviewed the triage vital signs and the nursing notes.  HISTORY   Chief Complaint Urinary Retention    HPI Max Ellis is a 83 y.o. male with a history of COPD and hypertension who presents to the ED for urinary retention.  Patient has not been able to pass urine since 1 AM this morning.  He reports a feeling of fullness in his bladder.  Patient reports a history of same several weeks ago that did require Foley catheter.  He does take Flomax for BPH.  He recently has been treated with antibiotics for pneumonia as well as steroids.  Past Medical History:  Diagnosis Date  . COPD (chronic obstructive pulmonary disease) ([place])   . Hypertension   . Peripheral vascular disease [facility])     Patient Active Problem List   Diagnosis Date Noted  . Essential hypertension 01/26/2017  . Tobacco use disorder 01/26/2017  . Pain in limb 01/26/2017    No past surgical history on file.  Allergies Patient has no known allergies.  Social History Social History   Tobacco Use  . Smoking status: Current Every Day Smoker  . Smokeless tobacco: Never Used  Substance Use Topics  . Alcohol use: No  . Drug use: No    Review of Systems Constitutional: Negative for fever. Cardiovascular: Negative for chest pain. Respiratory: Negative for shortness of breath.  Positive for cough Gastrointestinal: Negative for abdominal pain, vomiting and diarrhea. Genitourinary: N positive for urinary retention Musculoskeletal: Negative for back pain. Skin: Negative for rash. Neurological: Negative for headaches, focal weakness or numbness.  All systems negative/normal/unremarkable except as stated in the HPI  ____________________________________________   PHYSICAL EXAM:  VITAL SIGNS: ED Triage  Vitals  Enc Vitals Group     BP 06/25/17 1045 134/62     Pulse Rate 06/25/17 1045 84     Resp 06/25/17 1045 18     Temp 06/25/17 1045 (!) 97.5 F (36.4 C)     Temp Source 06/25/17 1045 Oral     SpO2 06/25/17 1045 99 %     Weight 06/25/17 1046 163 lb (73.9 kg)     Height 06/25/17 1046 6\' 2"  (1.88 m)     Head Circumference --      Peak Flow --      Pain Score 06/25/17 1045 5     Pain Loc --      Pain Edu? --      Excl. in [place]? --     Constitutional: Alert and oriented. Well appearing and in no distress. Eyes: Conjunctivae are normal. Normal extraocular movements. Cardiovascular: Normal rate, regular rhythm. No murmurs, rubs, or gallops. Respiratory: Normal respiratory effort without tachypnea nor retractions. Breath sounds are clear and equal bilaterally. No wheezes/rales/rhonchi. Gastrointestinal: Soft and nontender. Normal bowel sounds Musculoskeletal: Nontender with normal range of motion in extremities. No lower extremity tenderness nor edema. Neurologic:  Normal speech and language. No gross focal neurologic deficits are appreciated.  Skin:  Skin is warm, dry and intact. No rash noted. Psychiatric: Mood and affect are normal. Speech and behavior are normal.  ____________________________________________  ED COURSE:  Pertinent labs & imaging results that were available during my care of the patient were reviewed by me and considered in my medical decision making (see chart for details). Patient presents for urinary retention and had a Foley catheter placed  with around a liter of urine output prior to my seeing him.   Procedures ____________________________________________  DIFFERENTIAL DIAGNOSIS   Urinary retention, medication side effect, BPH, renal failure  FINAL ASSESSMENT AND PLAN  Acute urinary retention   Plan: Patient had presented for acute urinary retention.  Foley catheter was placed without any complication.  He will be referred for urology outpatient  follow-up.  Earleen Newport, MD   Note: This note was generated in part or whole with voice recognition software. Voice recognition is usually quite accurate but there are transcription errors that can and very often do occur. I apologize for any typographical errors that were not detected and corrected.     Earleen Newport, MD 06/25/17 [phone]

## 2017-06-25 NOTE — ED Triage Notes (Signed)
Pt reports has not been able to pass urine since 0100 today. Pt reports a feeling of fullness in his bladder. Pt reports history of the same a few weeks ago and required a catheter. Pt reports has recently been treated for pneumonia.

## 2017-06-25 NOTE — ED Notes (Signed)
Patient sent home with leg bag in place.

## 2017-07-08 ENCOUNTER — Ambulatory Visit (INDEPENDENT_AMBULATORY_CARE_PROVIDER_SITE_OTHER): Payer: Medicare Other | Admitting: Urology

## 2017-07-08 VITALS — BP 93/54 | HR 82 | Ht 74.0 in | Wt 151.7 lb

## 2017-07-08 DIAGNOSIS — N401 Enlarged prostate with lower urinary tract symptoms: Secondary | ICD-10-CM | POA: Diagnosis not present

## 2017-07-08 LAB — BLADDER SCAN AMB NON-IMAGING: Scan Result: 300

## 2017-07-08 MED ORDER — TAMSULOSIN HCL 0.4 MG PO CAPS: 0.8000 mg | ORAL_CAPSULE | Freq: Every day | ORAL | 11 refills | Status: DC

## 2017-07-08 NOTE — Progress Notes (Signed)
07/08/2017 8:30 AM   Max Ellis September 03, 1933 [id number]  Referring provider: Maryland Pink, MD [address] [facility] [place], [name] [address]  Chief Complaint  Patient presents with  . Urinary Retention    HPI: The patient is an 83 year old gentleman with a past medical history of BPH on Flomax and finasteride who presents the office today after being diagnosed with a urinary retention in the emergency department.  A Foley catheter was placed.  This is the second time that this has occurred recently.  Both occurrences came while he is being treated for pneumonia.  He is unclear if the medications he taken just prior to this, his symptoms were well on Flomax and finasteride.  He had a good stream.  He felt he emptied his bladder.  He denied hesitancy.  He denied any urinary complaints prior to this recent episode.  When he was diagnosed with attention, he was strong urge to urinate but was unable to.   PMH: Past Medical History:  Diagnosis Date  . COPD (chronic obstructive pulmonary disease) ([place])   . Hypertension   . Peripheral vascular disease [name])     Surgical History: History reviewed. No pertinent surgical history.  Home Medications:  Allergies as of 07/08/2017   No Known Allergies     Medication List        Accurate as of 07/08/17  8:30 AM. Always use your most recent med list.          ADVAIR DISKUS 250-50 MCG/DOSE Aepb Generic drug:  Fluticasone-Salmeterol Inhale into the lungs.   amLODipine 2.5 MG tablet Commonly known as:  NORVASC Take by mouth.   aspirin EC 81 MG tablet Take by mouth.   calcitRIOL 0.25 MCG capsule Commonly known as:  ROCALTROL TAKE 1 CAPSULE BY MOUTH THREE TIMES A WEEK TAKE MONDAY, WEDNESDAY AND FRIDAY   doxycycline 100 MG capsule Commonly known as:  VIBRAMYCIN Take by mouth.   finasteride 5 MG tablet Commonly known as:  PROSCAR Take 5 mg by mouth daily.   furosemide 40 MG tablet Commonly known as:   LASIX Take 40 mg by mouth 2 (two) times daily.   tamsulosin 0.4 MG Caps capsule Commonly known as:  FLOMAX TAKE 1 CAPSULE BY MOUTH ONCE DAILY. TAKE 30 MINUTES AFTER SAME MEAL EACH DAY.   traZODone 50 MG tablet Commonly known as:  DESYREL Take by mouth.   Vitamin D (Ergocalciferol) 50000 units Caps capsule Commonly known as:  DRISDOL Take by mouth.       Allergies: No Known Allergies  Family History: History reviewed. No pertinent family history.  Social History:  reports that he has been smoking.  he has never used smokeless tobacco. He reports that he does not drink alcohol or use drugs.  ROS: UROLOGY Frequent Urination?: No Hard to postpone urination?: No Burning/pain with urination?: No Get up at night to urinate?: No Leakage of urine?: No Urine stream starts and stops?: No Trouble starting stream?: No Do you have to strain to urinate?: Yes Blood in urine?: No Urinary tract infection?: No Sexually transmitted disease?: No Injury to kidneys or bladder?: No Painful intercourse?: No Weak stream?: Yes Erection problems?: No Penile pain?: No  Gastrointestinal Nausea?: No Vomiting?: No Indigestion/heartburn?: No Diarrhea?: No Constipation?: No  Constitutional Fever: No Night sweats?: No Weight loss?: No Fatigue?: No  Skin Skin rash/lesions?: No Itching?: No  Eyes Blurred vision?: No Double vision?: No  Ears/Nose/Throat Sore throat?: No Sinus problems?: No  Hematologic/Lymphatic  Swollen glands?: No Easy bruising?: No  Cardiovascular Leg swelling?: Yes Chest pain?: No  Respiratory Cough?: No Shortness of breath?: No  Endocrine Excessive thirst?: No  Musculoskeletal Back pain?: No Joint pain?: No  Neurological Headaches?: No Dizziness?: No  Psychologic Depression?: No Anxiety?: No  Physical Exam: BP (!) 93/54 (BP Location: Right Arm, Patient Position: Sitting, Cuff Size: Normal)   Pulse 82   Ht 6\' 2"  (1.88 m)   Wt 151 lb  11.2 oz (68.8 kg)   BMI 19.48 kg/m   Constitutional:  Alert and oriented, No acute distress. HEENT: [place] AT, moist mucus membranes.  Trachea midline, no masses. Cardiovascular: No clubbing, cyanosis, or edema. Respiratory: Normal respiratory effort, no increased work of breathing. GI: Abdomen is soft, nontender, nondistended, no abdominal masses GU: No CVA tenderness.  Skin: No rashes, bruises or suspicious lesions. Lymph: No cervical or inguinal adenopathy. Neurologic: Grossly intact, no focal deficits, moving all 4 extremities. Psychiatric: Normal mood and affect.  Laboratory Data: Lab Results  Component Value Date   WBC 8.1 06/09/2017   HGB 11.6 (L) 06/09/2017   HCT 34.2 (L) 06/09/2017   MCV 91.2 06/09/2017   PLT 150 06/09/2017    Lab Results  Component Value Date   CREATININE 2.48 (H) 06/09/2017    No results found for: PSA  No results found for: TESTOSTERONE  No results found for: HGBA1C  Urinalysis    Component Value Date/Time   COLORURINE YELLOW (A) 06/09/2017 1351   APPEARANCEUR CLEAR (A) 06/09/2017 1351   LABSPEC 1.006 06/09/2017 1351   PHURINE 5.0 06/09/2017 1351   GLUCOSEU NEGATIVE 06/09/2017 1351   HGBUR NEGATIVE 06/09/2017 1351   BILIRUBINUR NEGATIVE 06/09/2017 1351   KETONESUR NEGATIVE 06/09/2017 1351   PROTEINUR NEGATIVE 06/09/2017 1351   NITRITE NEGATIVE 06/09/2017 1351   LEUKOCYTESUR NEGATIVE 06/09/2017 1351    Assessment & Plan:    1. BPH with urinary retention The patient will continue his Flomax and finasteride for now.  He will undergo a fill and pull in her office today.  If he passes this, he will follow-up in 1 month for a PVR.  If he fails we will bring him back this afternoon for PVR at that time.  If he still requires a catheter, we will increase his Flomax to twice a day as well.  Return in about 4 weeks (around 08/05/2017) for PVR.  [name], MD  [facility] [address] [place], [place] [address] [phone]

## 2017-07-08 NOTE — Progress Notes (Signed)
Fill and Pull Catheter Removal  Patient is present today for a catheter removal.  Patient was cleaned and prepped in a sterile fashion 256ml of sterile water/ saline was instilled into the bladder when the patient felt the urge to urinate. 27ml of water was then drained from the balloon.  A 16FR foley cath was removed from the bladder no complications were noted .  Patient as then given some time to void on their own.  Patient can void  183ml on their own after some time.  Patient tolerated well.  Preformed by: Elberta Leatherwood, CMA  Follow up/ Additional notes: Return by 3pm for PVR

## 2017-07-08 NOTE — Progress Notes (Signed)
Simple Catheter Placement  Due to urinary retention patient is present today for a foley cath placement.  Patient was cleaned and prepped in a sterile fashion with betadine and lidocaine jelly 2% was instilled into the urethra.  A 16 FR foley catheter was inserted, urine return was noted  352ml, urine was yellow in color.  The balloon was filled with 10cc of sterile water.  A leg bag was attached for drainage. Patient was also given a night bag to take home and was given instruction on how to change from one bag to another.  Patient was given instruction on proper catheter care.  Patient tolerated well, no complications were noted   Preformed by: Toniann Fail, LPN   Additional notes/ Follow up: 1 week for nurse visit V&T also double up on flomax per Dr. Pilar Jarvis.

## 2017-07-15 ENCOUNTER — Ambulatory Visit (INDEPENDENT_AMBULATORY_CARE_PROVIDER_SITE_OTHER): Payer: Medicare Other

## 2017-07-15 VITALS — BP 114/56 | HR 78 | Ht 74.0 in | Wt 155.0 lb

## 2017-07-15 DIAGNOSIS — N401 Enlarged prostate with lower urinary tract symptoms: Secondary | ICD-10-CM | POA: Diagnosis not present

## 2017-07-15 LAB — BLADDER SCAN AMB NON-IMAGING: SCAN RESULT: 270

## 2017-07-15 NOTE — Addendum Note (Signed)
Addended by: Toniann Fail C on: 07/15/2017 04:40 PM   Modules accepted: Orders

## 2017-07-15 NOTE — Progress Notes (Addendum)
Fill and Pull Catheter Removal  Patient is present today for a catheter removal.  Patient was cleaned and prepped in a sterile fashion 150ml of sterile water/ saline was instilled into the bladder when the patient felt the urge to urinate. 22ml of water was then drained from the balloon.  A 16FR foley cath was removed from the bladder no complications were noted .  Patient as then given some time to void on their own.  Patient can void  168ml on their own after some time.  Patient tolerated well.  Preformed by: Toniann Fail, LPN   Follow up/ Additional notes: Reinforced with pt if not able to urinate once he gets home to RTC. Pt and grandson voiced understanding. Pt made a 75mo f/u with Dr. Pilar Jarvis at check out.   Blood pressure (!) 114/56, pulse 78, height 6\' 2"  (1.88 m), weight 155 lb (70.3 kg).  PVR: 270 Per Dr. Pilar Jarvis pt can either have catheter replaced or RTC in the morning for a bladder scan.

## 2017-07-16 ENCOUNTER — Ambulatory Visit: Payer: Self-pay

## 2017-07-16 ENCOUNTER — Emergency Department
Admission: EM | Admit: 2017-07-16 | Discharge: 2017-07-16 | Disposition: A | Discharge status: Home / Self Care | Payer: Medicare Other | Attending: Emergency Medicine | Admitting: Emergency Medicine

## 2017-07-16 DIAGNOSIS — Z79899 Other long term (current) drug therapy: Secondary | ICD-10-CM | POA: Diagnosis not present

## 2017-07-16 DIAGNOSIS — J449 Chronic obstructive pulmonary disease, unspecified: Secondary | ICD-10-CM | POA: Insufficient documentation

## 2017-07-16 DIAGNOSIS — I10 Essential (primary) hypertension: Secondary | ICD-10-CM | POA: Insufficient documentation

## 2017-07-16 DIAGNOSIS — R339 Retention of urine, unspecified: Secondary | ICD-10-CM | POA: Diagnosis present

## 2017-07-16 DIAGNOSIS — F17200 Nicotine dependence, unspecified, uncomplicated: Secondary | ICD-10-CM | POA: Insufficient documentation

## 2017-07-16 LAB — URINALYSIS, COMPLETE (UACMP) WITH MICROSCOPIC
Bacteria, UA: NONE SEEN
Bilirubin Urine: NEGATIVE
Glucose, UA: NEGATIVE mg/dL
KETONES UR: NEGATIVE mg/dL
Nitrite: NEGATIVE
PROTEIN: NEGATIVE mg/dL
SQUAMOUS EPITHELIAL / LPF: NONE SEEN
Specific Gravity, Urine: 1.003 — ABNORMAL LOW (ref 1.005–1.030)
pH: 6 (ref 5.0–8.0)

## 2017-07-16 NOTE — ED Provider Notes (Signed)
[facility] Emergency Department Provider Note  ____________________________________________   First MD Initiated Contact with Patient 07/16/17 [phone]     (approximate)  I have reviewed the triage vital signs and the nursing notes.   HISTORY  Chief Complaint Urinary Retention   HPI Max Ellis is a 83 y.o. male himself presents the emergency department with several days of acute urinary retention.  He has had multiple episodes recently and is currently taking finasteride as well as tamsulosin.  His most recently had a Foley catheter removed by Dr. Pilar Jarvis yesterday.  His symptoms began suddenly and have been slowly progressive.  He has currently moderate to severe throbbing aching discomfort in his lower abdomen.  Nothing seems to make it better or worse.  He denies back pain.  He denies fevers or chills.  Past Medical History:  Diagnosis Date  . COPD (chronic obstructive pulmonary disease) ([place])   . Hypertension   . Peripheral vascular disease [facility])     Patient Active Problem List   Diagnosis Date Noted  . Essential hypertension 01/26/2017  . Tobacco use disorder 01/26/2017  . Pain in limb 01/26/2017    History reviewed. No pertinent surgical history.  Prior to Admission medications   Medication Sig Start Date End Date Taking? Authorizing Provider  amLODipine (NORVASC) 2.5 MG tablet Take by mouth.    [provider]  aspirin EC 81 MG tablet Take by mouth.    [provider]  calcitRIOL (ROCALTROL) 0.25 MCG capsule TAKE 1 CAPSULE BY MOUTH THREE TIMES A WEEK TAKE MONDAY, [facility] AND FRIDAY 12/30/16   [provider]  doxycycline (VIBRAMYCIN) 100 MG capsule Take by mouth. 11/18/16   [provider]  finasteride (PROSCAR) 5 MG tablet Take 5 mg by mouth daily. 12/30/16   [provider]  Fluticasone-Salmeterol (ADVAIR DISKUS) 250-50 MCG/DOSE AEPB Inhale into the lungs.    [provider]  furosemide  (LASIX) 40 MG tablet Take 40 mg by mouth 2 (two) times daily. 12/30/16   [provider]  tamsulosin (FLOMAX) 0.4 MG CAPS capsule TAKE 1 CAPSULE BY MOUTH ONCE DAILY. TAKE 30 MINUTES AFTER SAME MEAL EACH DAY. 05/25/16   [provider]  tamsulosin (FLOMAX) 0.4 MG CAPS capsule Take 2 capsules (0.8 mg total) by mouth daily. 07/08/17   [name], MD  traZODone (DESYREL) 50 MG tablet Take by mouth. 01/06/17 01/06/18  [provider]  Vitamin D, Ergocalciferol, (DRISDOL) 50000 units CAPS capsule Take by mouth.    [provider]    Allergies Patient has no known allergies.  No family history on file.  Social History Social History   Tobacco Use  . Smoking status: Current Every Day Smoker  . Smokeless tobacco: Never Used  Substance Use Topics  . Alcohol use: No  . Drug use: No    Review of Systems Constitutional: No fever/chills ENT: No sore throat. Cardiovascular: Denies chest pain. Respiratory: Denies shortness of breath. Gastrointestinal: Positive for abdominal pain.  No nausea, no vomiting.  No diarrhea.  No constipation. Musculoskeletal: Negative for back pain. Neurological: Negative for headaches   ____________________________________________   PHYSICAL EXAM:  VITAL SIGNS: ED Triage Vitals  Enc Vitals Group     BP 07/16/17 0254 124/73     Pulse Rate 07/16/17 0254 76     Resp 07/16/17 0254 17     Temp 07/16/17 0254 97.8 F (36.6 C)     Temp Source 07/16/17 0254 Oral  SpO2 07/16/17 0254 98 %     Weight 07/16/17 0250 155 lb (70.3 kg)     Height --      Head Circumference --      Peak Flow --      Pain Score 07/16/17 0250 8     Pain Loc --      Pain Edu? --      Excl. in [place]? --     Constitutional: Alert and oriented x4 appears somewhat uncomfortable nontoxic no diaphoresis speaks full clear sentences Head: Atraumatic. Nose: No congestion/rhinnorhea. Mouth/Throat: No trismus Neck: No stridor.   Cardiovascular: Regular  rate and rhythm Respiratory: Normal respiratory effort.  No retractions. Gastrointestinal: Soft mild suprapubic tenderness with no rebound or guarding no peritonitis no costovertebral tenderness Neurologic:  Normal speech and language. No gross focal neurologic deficits are appreciated.  Skin:  Skin is warm, dry and intact. No rash noted.    ____________________________________________  LABS (all labs ordered are listed, but only abnormal results are displayed)  Labs Reviewed  URINALYSIS, COMPLETE (UACMP) WITH MICROSCOPIC - Abnormal; Notable for the following components:      Result Value   Color, Urine STRAW (*)    APPearance CLEAR (*)    Specific Gravity, Urine 1.003 (*)    Hgb urine dipstick SMALL (*)    Leukocytes, UA TRACE (*)    All other components within normal limits    Urinalysis reviewed by me with no acute disease __________________________________________  EKG   ____________________________________________  RADIOLOGY   ____________________________________________   DIFFERENTIAL includes but not limited to  Acute urinary retention, prostatitis, urinary tract infection, pyelonephritis   PROCEDURES  Procedure(s) performed: no  Procedures  Critical Care performed: no  Observation: no ____________________________________________   INITIAL IMPRESSION / ASSESSMENT AND PLAN / ED COURSE  Pertinent labs & imaging results that were available during my care of the patient were reviewed by me and considered in my medical decision making (see chart for details).  On arrival the patient has acute urinary retention is uncomfortable appearing.  Bedside bladder scan shows nearly 1 L of fluid.  Foley catheter placed easily with near immediate improvement in his symptoms.  Urinalysis with no evidence of infection.  I have advised the patient to continue taking his medications but to keep the Foley catheter in place until he is able to follow-up with his urologist  this coming week.  He verbalized understanding agreement the plan.  He is discharged home in improved condition.      ____________________________________________   FINAL CLINICAL IMPRESSION(S) / ED DIAGNOSES  Final diagnoses:  Urinary retention      NEW MEDICATIONS STARTED DURING THIS VISIT:  This SmartLink is deprecated. Use AVSMEDLIST instead to display the medication list for a patient.   Note:  This document was prepared using Dragon voice recognition software and may include unintentional dictation errors.      Darel Hong, MD 07/17/17 [phone]

## 2017-07-16 NOTE — ED Triage Notes (Signed)
Patient had catheter removed Thursday morning. Patient has only urinated approx 300 mL since. Patient c/o pelvic pain/pressure.

## 2017-07-16 NOTE — Discharge Instructions (Signed)
Please keep your Foley catheter in place until he can follow-up with your urologist this coming week for a recheck.  Return to the emergency department sooner for any concerns.  It was a pleasure to take care of you today, and thank you for coming to our emergency department.  If you have any questions or concerns before leaving please ask the nurse to grab me and I'm more than happy to go through your aftercare instructions again.  If you were prescribed any opioid pain medication today such as Norco, Vicodin, Percocet, morphine, hydrocodone, or oxycodone please make sure you do not drive when you are taking this medication as it can alter your ability to drive safely.  If you have any concerns once you are home that you are not improving or are in fact getting worse before you can make it to your follow-up appointment, please do not hesitate to call 911 and come back for further evaluation.  Darel Hong, MD  Results for orders placed or performed during the hospital encounter of 07/16/17  Urinalysis, Complete w Microscopic  Result Value Ref Range   Color, Urine STRAW (A) YELLOW   APPearance CLEAR (A) CLEAR   Specific Gravity, Urine 1.003 (L) 1.005 - 1.030   pH 6.0 5.0 - 8.0   Glucose, UA NEGATIVE NEGATIVE mg/dL   Hgb urine dipstick SMALL (A) NEGATIVE   Bilirubin Urine NEGATIVE NEGATIVE   Ketones, ur NEGATIVE NEGATIVE mg/dL   Protein, ur NEGATIVE NEGATIVE mg/dL   Nitrite NEGATIVE NEGATIVE   Leukocytes, UA TRACE (A) NEGATIVE   RBC / HPF 0-5 0 - 5 RBC/hpf   WBC, UA 0-5 0 - 5 WBC/hpf   Bacteria, UA NONE SEEN NONE SEEN   Squamous Epithelial / LPF NONE SEEN NONE SEEN

## 2017-08-13 ENCOUNTER — Ambulatory Visit: Payer: Medicare Other | Admitting: Urology

## 2017-08-13 ENCOUNTER — Encounter: Payer: Self-pay | Admitting: Urology

## 2017-08-13 VITALS — BP 148/62 | HR 87 | Ht 74.0 in | Wt 154.1 lb

## 2017-08-13 DIAGNOSIS — N401 Enlarged prostate with lower urinary tract symptoms: Secondary | ICD-10-CM

## 2017-08-13 DIAGNOSIS — R338 Other retention of urine: Secondary | ICD-10-CM

## 2017-08-13 NOTE — Progress Notes (Signed)
08/13/2017 4:04 PM   Max Ellis 03/31/1934 [id number]  Referring provider: Maryland Pink, MD [address] [facility] [place], [place] [address]  Chief Complaint  Patient presents with  . Follow-up    HPI: The patient is an 84 year old gentleman with a past medical history of BPH on Flomax and finasteride who presents the office today after being diagnosed with a urinary retention in the emergency department.  A Foley catheter was placed.  This is the second time that this has occurred recently.  Both occurrences came while he is being treated for pneumonia.  He is unclear if the medications he taken just prior to this, his symptoms were well on Flomax and finasteride.  He had a good stream.  He felt he emptied his bladder.  He denied hesitancy.  He denied any urinary complaints prior to this recent episode.  When he was diagnosed with attention, he was strong urge to urinate but was unable to.  He then passed a trial of void however and up in the ER the next day where the catheter was replaced.  This approximately 1 month ago.  The Foley remains in place at this time.         PMH: Past Medical History:  Diagnosis Date  . COPD (chronic obstructive pulmonary disease) ([place])   . Hypertension   . Peripheral vascular disease [facility])     Surgical History: History reviewed. No pertinent surgical history.  Home Medications:  Allergies as of 08/13/2017   No Known Allergies     Medication List        Accurate as of 08/13/17  4:04 PM. Always use your most recent med list.          ADVAIR DISKUS 250-50 MCG/DOSE Aepb Generic drug:  Fluticasone-Salmeterol Inhale into the lungs.   amLODipine 2.5 MG tablet Commonly known as:  NORVASC Take by mouth.   aspirin EC 81 MG tablet Take by mouth.   calcitRIOL 0.25 MCG capsule Commonly known as:  ROCALTROL TAKE 1 CAPSULE BY MOUTH THREE TIMES A WEEK TAKE MONDAY, WEDNESDAY AND FRIDAY   doxycycline 100 MG  capsule Commonly known as:  VIBRAMYCIN Take by mouth.   finasteride 5 MG tablet Commonly known as:  PROSCAR Take 5 mg by mouth daily.   furosemide 40 MG tablet Commonly known as:  LASIX Take 40 mg by mouth 2 (two) times daily.   tamsulosin 0.4 MG Caps capsule Commonly known as:  FLOMAX TAKE 1 CAPSULE BY MOUTH ONCE DAILY. TAKE 30 MINUTES AFTER SAME MEAL EACH DAY.   traZODone 50 MG tablet Commonly known as:  DESYREL Take by mouth.   Vitamin D (Ergocalciferol) 50000 units Caps capsule Commonly known as:  DRISDOL Take by mouth.       Allergies: No Known Allergies  Family History: History reviewed. No pertinent family history.  Social History:  reports that he has been smoking.  he has never used smokeless tobacco. He reports that he does not drink alcohol or use drugs.  ROS: UROLOGY Frequent Urination?: No Hard to postpone urination?: No Burning/pain with urination?: No Get up at night to urinate?: No Leakage of urine?: No Urine stream starts and stops?: No Trouble starting stream?: No Do you have to strain to urinate?: No Blood in urine?: No Urinary tract infection?: No Sexually transmitted disease?: No Injury to kidneys or bladder?: No Painful intercourse?: No Weak stream?: No Erection problems?: No Penile pain?: No  Gastrointestinal Nausea?: No Vomiting?: No Indigestion/heartburn?:  No Diarrhea?: Yes Constipation?: No  Constitutional Fever: No Night sweats?: No Weight loss?: Yes Fatigue?: No  Skin Skin rash/lesions?: No Itching?: No  Eyes Blurred vision?: No Double vision?: No  Ears/Nose/Throat Sore throat?: No Sinus problems?: No  Hematologic/Lymphatic Swollen glands?: No Easy bruising?: No  Cardiovascular Leg swelling?: Yes Chest pain?: No  Respiratory Cough?: No Shortness of breath?: No  Endocrine Excessive thirst?: No  Musculoskeletal Back pain?: No Joint pain?: No  Neurological Headaches?: No Dizziness?:  No  Psychologic Depression?: No Anxiety?: No  Physical Exam: BP (!) 148/62 (BP Location: Right Arm, Patient Position: Sitting, Cuff Size: Normal)   Pulse 87   Ht 6\' 2"  (1.88 m)   Wt 154 lb 1.6 oz (69.9 kg)   BMI 19.79 kg/m   Constitutional:  Alert and oriented, No acute distress. HEENT: [place] AT, moist mucus membranes.  Trachea midline, no masses. Cardiovascular: No clubbing, cyanosis, or edema. Respiratory: Normal respiratory effort, no increased work of breathing. GI: Abdomen is soft, nontender, nondistended, no abdominal masses GU: No CVA tenderness.  Skin: No rashes, bruises or suspicious lesions. Lymph: No cervical or inguinal adenopathy. Neurologic: Grossly intact, no focal deficits, moving all 4 extremities. Psychiatric: Normal mood and affect.  Laboratory Data: Lab Results  Component Value Date   WBC 8.1 06/09/2017   HGB 11.6 (L) 06/09/2017   HCT 34.2 (L) 06/09/2017   MCV 91.2 06/09/2017   PLT 150 06/09/2017    Lab Results  Component Value Date   CREATININE 2.48 (H) 06/09/2017    No results found for: PSA  No results found for: TESTOSTERONE  No results found for: HGBA1C  Urinalysis    Component Value Date/Time   COLORURINE STRAW (A) 07/16/2017 0307   APPEARANCEUR CLEAR (A) 07/16/2017 0307   LABSPEC 1.003 (L) 07/16/2017 0307   PHURINE 6.0 07/16/2017 0307   GLUCOSEU NEGATIVE 07/16/2017 0307   HGBUR SMALL (A) 07/16/2017 0307   BILIRUBINUR NEGATIVE 07/16/2017 0307   KETONESUR NEGATIVE 07/16/2017 0307   PROTEINUR NEGATIVE 07/16/2017 0307   NITRITE NEGATIVE 07/16/2017 0307   LEUKOCYTESUR TRACE (A) 07/16/2017 0307    Assessment & Plan:    1.  BPH with retention We will increase the patient's Flomax to 2 pills daily.  He will continue his finasteride.  We will arrange for him to undergo a trial of void next week in the morning with the nurses.  If he fails this, he will need cystoscopy and possibly urodynamic studies to prepare for potential surgical  management.  Return for AM nurse visti for TOV early next week.  [name], MD  [facility] [address], [place], [place] [address] [phone]

## 2017-08-14 ENCOUNTER — Encounter: Payer: Self-pay | Admitting: Emergency Medicine

## 2017-08-14 ENCOUNTER — Emergency Department
Admission: EM | Admit: 2017-08-14 | Discharge: 2017-08-14 | Disposition: A | Discharge status: Home / Self Care | Payer: Medicare Other | Attending: Emergency Medicine | Admitting: Emergency Medicine

## 2017-08-14 ENCOUNTER — Other Ambulatory Visit: Payer: Self-pay

## 2017-08-14 DIAGNOSIS — J449 Chronic obstructive pulmonary disease, unspecified: Secondary | ICD-10-CM | POA: Diagnosis not present

## 2017-08-14 DIAGNOSIS — I10 Essential (primary) hypertension: Secondary | ICD-10-CM | POA: Diagnosis not present

## 2017-08-14 DIAGNOSIS — F17200 Nicotine dependence, unspecified, uncomplicated: Secondary | ICD-10-CM | POA: Insufficient documentation

## 2017-08-14 DIAGNOSIS — T83091A Other mechanical complication of indwelling urethral catheter, initial encounter: Secondary | ICD-10-CM

## 2017-08-14 DIAGNOSIS — Y829 Unspecified medical devices associated with adverse incidents: Secondary | ICD-10-CM | POA: Insufficient documentation

## 2017-08-14 DIAGNOSIS — Z79899 Other long term (current) drug therapy: Secondary | ICD-10-CM | POA: Diagnosis not present

## 2017-08-14 NOTE — ED Notes (Signed)
When RN assess leg bag bottom and top strap almost touching so close and bag appears kinked. Pulled bottom strap down and started draining immediately.  By time RN returned with bladder scan machine bag almost full of urine. Explained to patient need to have bottom strap lower on leg so that bag does not kink and will drain. Verbalized understanding.

## 2017-08-14 NOTE — ED Notes (Signed)
Foley switched back to drainage bag from leg bag per request from family. Ok per dr Reita Cliche. Instructed patient on how to keep bag at home so that it remains patent

## 2017-08-14 NOTE — ED Triage Notes (Signed)
Patient to ER for no urine draining in foley catheter bag. Patient c/o pain to pelvic area around bladder. Patient states he has not had any urine since yesterday. Denies any known clots prior to retention, but has had to have several foley's placed in past.

## 2017-08-14 NOTE — ED Provider Notes (Signed)
[facility] Emergency Department Provider Note ____________________________________________   I have reviewed the triage vital signs and the triage nursing note.  HISTORY  Chief Complaint Urinary Retention   Historian Patient, and grandson  HPI Max Ellis is a 84 y.o. male with an indwelling Foley catheter for about a month now, followed by urology, presenting due to feeling of fullness like he cannot urinate in the bag does not have much urine in it this morning.  Symptoms developed overnight and worse this morning.  Patient had a leg bag on that the elastic was kinking the leg bag and nurse was able to release the elastic obstruction and immediately over 400 cc drained into the bag.  Patient had relief of symptoms.  Discomfort was moderate, down to none after nursing intervention.   Past Medical History:  Diagnosis Date  . COPD (chronic obstructive pulmonary disease) ([place])   . Hypertension   . Peripheral vascular disease [facility])     Patient Active Problem List   Diagnosis Date Noted  . Essential hypertension 01/26/2017  . Tobacco use disorder 01/26/2017  . Pain in limb 01/26/2017    History reviewed. No pertinent surgical history.  Prior to Admission medications   Medication Sig Start Date End Date Taking? Authorizing Provider  amLODipine (NORVASC) 2.5 MG tablet Take by mouth.    [provider]  aspirin EC 81 MG tablet Take by mouth.    [provider]  calcitRIOL (ROCALTROL) 0.25 MCG capsule TAKE 1 CAPSULE BY MOUTH THREE TIMES A WEEK TAKE MONDAY, [facility] AND FRIDAY 12/30/16   [provider]  doxycycline (VIBRAMYCIN) 100 MG capsule Take by mouth. 11/18/16   [provider]  finasteride (PROSCAR) 5 MG tablet Take 5 mg by mouth daily. 12/30/16   [provider]  Fluticasone-Salmeterol (ADVAIR DISKUS) 250-50 MCG/DOSE AEPB Inhale into the lungs.    [provider]  furosemide (LASIX) 40 MG  tablet Take 40 mg by mouth 2 (two) times daily. 12/30/16   [provider]  tamsulosin (FLOMAX) 0.4 MG CAPS capsule TAKE 1 CAPSULE BY MOUTH ONCE DAILY. TAKE 30 MINUTES AFTER SAME MEAL EACH DAY. 05/25/16   [provider]  traZODone (DESYREL) 50 MG tablet Take by mouth. 01/06/17 01/06/18  [provider]  Vitamin D, Ergocalciferol, (DRISDOL) 50000 units CAPS capsule Take by mouth.    [provider]    No Known Allergies  No family history on file.  Social History Social History   Tobacco Use  . Smoking status: Current Every Day Smoker  . Smokeless tobacco: Never Used  Substance Use Topics  . Alcohol use: No  . Drug use: No    Review of Systems  Constitutional: Negative for fever. Eyes: Negative for visual changes. ENT: Negative for sore throat. Cardiovascular: Negative for chest pain. Respiratory: Negative for shortness of breath. Gastrointestinal: Negative for vomiting and diarrhea. Genitourinary: Negative for hematuria. Musculoskeletal: Negative for back pain. Skin: Negative for rash. Neurological: Negative for headache.  ____________________________________________   PHYSICAL EXAM:  VITAL SIGNS: ED Triage Vitals [08/14/17 0804]  Enc Vitals Group     BP (!) 144/67     Pulse Rate 85     Resp 20     Temp 97.7 F (36.5 C)     Temp Source Oral     SpO2 99 %     Weight 154 lb (69.9 kg)     Height 6\' 2"  (1.88 m)     Head Circumference  Peak Flow      Pain Score      Pain Loc      Pain Edu?      Excl. in [place]?      Constitutional: Alert and oriented. Well appearing and in no distress. HEENT   Head: Normocephalic and atraumatic.      Eyes: Conjunctivae are normal. Pupils equal and round.       Ears:         Nose: No congestion/rhinnorhea.   Mouth/Throat: Mucous membranes are moist.   Neck: No stridor. Cardiovascular/Chest: Strong peripheral radial pulse, regular. Respiratory: Normal respiratory effort without  tachypnea nor retractions. Gastrointestinal: Soft. No distention, no guarding, no rebound. Nontender.  Genitourinary/rectal:Deferred Musculoskeletal: Nontender with normal range of motion in all extremities. . Neurologic:  Normal speech and language. No gross or focal neurologic deficits are appreciated. Skin:  Skin is warm, dry and intact. No rash noted. Psychiatric: Mood and affect are normal. Speech and behavior are normal. Patient exhibits appropriate insight and judgment.   ____________________________________________  LABS (pertinent positives/negatives) I, Lisa Roca, MD the attending physician have reviewed the labs noted below.  Labs Reviewed - No data to display  ____________________________________________    EKG I, Lisa Roca, MD, the attending physician have personally viewed and interpreted all ECGs.  None ____________________________________________  RADIOLOGY All Xrays were viewed by me.  Imaging interpreted by Radiologist, and I, Lisa Roca, MD the attending physician have reviewed the radiologist interpretation noted below.  None __________________________________________  PROCEDURES  Procedure(s) performed: None  Critical Care performed: None   ____________________________________________  ED COURSE / ASSESSMENT AND PLAN  Pertinent labs & imaging results that were available during my care of the patient were reviewed by me and considered in my medical decision making (see chart for details).    Patient had urinary obstruction due to external bag being clamped by elastic.  Nurse was able to alleviate this patient had drainage of his bladder.  Immediately after 400 cc fluid, 200 cc approximately in the bladder.  When I saw the patient there was approximately 600 cc in the bag and patient was asymptomatic.  We discussed watching for external obstruction with the elastic in the leg bag.  Patient and grandson actually asked to have the other  type of bedside drainage because he does not really need the portable leg bad so much as he needs the to gravity regular Foley catheter and this was changed.  DIFFERENTIAL DIAGNOSIS: Including but not limited to urinary tract infection, blood clot or full obstruction, external Foley obstruction.  CONSULTATIONS:   None   Patient / Family / Caregiver informed of clinical course, medical decision-making process, and agree with plan.   I discussed return precautions, follow-up instructions, and discharge instructions with patient and/or family.    ___________________________________________   FINAL CLINICAL IMPRESSION(S) / ED DIAGNOSES   Final diagnoses:  Obstruction of Foley catheter, initial encounter ([place])      ___________________________________________        Note: This dictation was prepared with Dragon dictation. Any transcriptional errors that result from this process are unintentional    Lisa Roca, MD 08/14/17 [phone]

## 2017-08-17 ENCOUNTER — Ambulatory Visit (INDEPENDENT_AMBULATORY_CARE_PROVIDER_SITE_OTHER): Payer: Medicare Other

## 2017-08-17 VITALS — BP 131/56 | HR 85 | Ht 74.0 in | Wt 157.0 lb

## 2017-08-17 DIAGNOSIS — R338 Other retention of urine: Secondary | ICD-10-CM

## 2017-08-17 DIAGNOSIS — N401 Enlarged prostate with lower urinary tract symptoms: Secondary | ICD-10-CM | POA: Diagnosis not present

## 2017-08-17 NOTE — Progress Notes (Signed)
Fill and Pull Catheter Removal  Patient is present today for a catheter removal.  Patient was cleaned and prepped in a sterile fashion 163ml of sterile water/ saline was instilled into the bladder when the patient felt the urge to urinate. 37ml of water was then drained from the balloon.  A 16FR foley cath was removed from the bladder no complications were noted .  Patient as then given some time to void on their own.  Patient can void  19ml on their own after some time.  Patient tolerated well.  Preformed by: Toniann Fail, LPN   Follow up/ Additional notes: Reinforced with pt if not able to urinate to RTC by 3pm. Pt voiced understanding.  Blood pressure (!) 131/56, pulse 85, height 6\' 2"  (1.88 m), weight 157 lb (71.2 kg).

## 2017-08-30 ENCOUNTER — Encounter: Payer: Self-pay | Admitting: Intensive Care

## 2017-08-30 ENCOUNTER — Emergency Department
Admission: EM | Admit: 2017-08-30 | Discharge: 2017-08-30 | Disposition: A | Discharge status: Home / Self Care | Payer: Medicare Other | Attending: Emergency Medicine | Admitting: Emergency Medicine

## 2017-08-30 ENCOUNTER — Ambulatory Visit (INDEPENDENT_AMBULATORY_CARE_PROVIDER_SITE_OTHER): Payer: Medicare Other | Admitting: Family Medicine

## 2017-08-30 DIAGNOSIS — N390 Urinary tract infection, site not specified: Secondary | ICD-10-CM | POA: Diagnosis not present

## 2017-08-30 DIAGNOSIS — R350 Frequency of micturition: Secondary | ICD-10-CM | POA: Diagnosis present

## 2017-08-30 DIAGNOSIS — J449 Chronic obstructive pulmonary disease, unspecified: Secondary | ICD-10-CM | POA: Diagnosis not present

## 2017-08-30 DIAGNOSIS — I129 Hypertensive chronic kidney disease with stage 1 through stage 4 chronic kidney disease, or unspecified chronic kidney disease: Secondary | ICD-10-CM | POA: Diagnosis not present

## 2017-08-30 DIAGNOSIS — N184 Chronic kidney disease, stage 4 (severe): Secondary | ICD-10-CM | POA: Diagnosis not present

## 2017-08-30 DIAGNOSIS — E860 Dehydration: Secondary | ICD-10-CM | POA: Diagnosis not present

## 2017-08-30 DIAGNOSIS — F17210 Nicotine dependence, cigarettes, uncomplicated: Secondary | ICD-10-CM | POA: Diagnosis not present

## 2017-08-30 DIAGNOSIS — R339 Retention of urine, unspecified: Secondary | ICD-10-CM | POA: Diagnosis not present

## 2017-08-30 HISTORY — DX: Chronic kidney disease, stage 4 (severe): N18.4

## 2017-08-30 HISTORY — DX: Disorder of kidney and ureter, unspecified: N28.9

## 2017-08-30 LAB — URINALYSIS, COMPLETE (UACMP) WITH MICROSCOPIC
BILIRUBIN URINE: NEGATIVE
GLUCOSE, UA: NEGATIVE mg/dL
KETONES UR: NEGATIVE mg/dL
Nitrite: POSITIVE — AB
PROTEIN: NEGATIVE mg/dL
SQUAMOUS EPITHELIAL / LPF: NONE SEEN
Specific Gravity, Urine: 1.005 (ref 1.005–1.030)
pH: 5 (ref 5.0–8.0)

## 2017-08-30 LAB — BASIC METABOLIC PANEL
Anion gap: 8 (ref 5–15)
Anion gap: 9 (ref 5–15)
BUN: 22 mg/dL — ABNORMAL HIGH (ref 6–20)
BUN: 21 mg/dL — AB (ref 6–20)
CALCIUM: 9 mg/dL (ref 8.9–10.3)
CHLORIDE: 101 mmol/L (ref 101–111)
CHLORIDE: 100 mmol/L — AB (ref 101–111)
CO2: 23 mmol/L (ref 22–32)
CO2: 23 mmol/L (ref 22–32)
CREATININE: 2.11 mg/dL — AB (ref 0.61–1.24)
CREATININE: 2.13 mg/dL — AB (ref 0.61–1.24)
Calcium: 9.1 mg/dL (ref 8.9–10.3)
GFR, EST AFRICAN AMERICAN: 32 mL/min — AB (ref >60)
GFR, EST AFRICAN AMERICAN: 32 mL/min — AB (ref >60)
GFR, EST NON AFRICAN AMERICAN: 28 mL/min — AB (ref >60)
GFR, EST NON AFRICAN AMERICAN: 27 mL/min — AB (ref >60)
Glucose, Bld: 106 mg/dL — ABNORMAL HIGH (ref 65–99)
Glucose, Bld: 101 mg/dL — ABNORMAL HIGH (ref 65–99)
Potassium: 5.5 mmol/L — ABNORMAL HIGH (ref 3.5–5.1)
Potassium: 5.2 mmol/L — ABNORMAL HIGH (ref 3.5–5.1)
SODIUM: 132 mmol/L — AB (ref 135–145)
SODIUM: 132 mmol/L — AB (ref 135–145)

## 2017-08-30 LAB — CBC
HCT: 34.3 % — ABNORMAL LOW (ref 40.0–52.0)
HEMOGLOBIN: 11.2 g/dL — AB (ref 13.0–18.0)
MCH: 29.9 pg (ref 26.0–34.0)
MCHC: 32.8 g/dL (ref 32.0–36.0)
MCV: 91.4 fL (ref 80.0–100.0)
PLATELETS: 173 10*3/uL (ref 150–440)
RBC: 3.75 MIL/uL — AB (ref 4.40–5.90)
RDW: 14.9 % — ABNORMAL HIGH (ref 11.5–14.5)
WBC: 6.6 10*3/uL (ref 3.8–10.6)

## 2017-08-30 LAB — BLADDER SCAN AMB NON-IMAGING: Scan Result: 71

## 2017-08-30 MED ORDER — CIPROFLOXACIN HCL 250 MG PO TABS: 250.0000 mg | ORAL_TABLET | Freq: Two times a day (BID) | ORAL | 0 refills | Status: DC

## 2017-08-30 MED ORDER — SODIUM CHLORIDE 0.9 % IV SOLN
Freq: Once | INTRAVENOUS | Status: AC
  Administered 2017-08-30: 14:00:00 via INTRAVENOUS

## 2017-08-30 MED ORDER — CEFTRIAXONE SODIUM IN DEXTROSE 20 MG/ML IV SOLN
1.0000 g | Freq: Once | INTRAVENOUS | Status: AC
  Administered 2017-08-30: 1 g via INTRAVENOUS
  Filled 2017-08-30: qty 50

## 2017-08-30 NOTE — ED Triage Notes (Addendum)
FIRST NURSE NOTE- urinary retention since finished abx one month ago. Has been cathiterized at urology but foley was removed.  Does have CKD. Unlabored. ambulatory

## 2017-08-30 NOTE — Progress Notes (Signed)
Patient presents today with abdominal discomfort. He stated he has been on ABX for Pneumonia and he feels he is not getting enough urine out. He states he dos not have dysuria and has not seen any blood. I performed a PVR, the result was 71Ml. I informed him that he does not have enough in the bladder to urinate. I suggested he drink more fluids today and see if he is able to urinate after. I told him to call us if he needs [place]. His daughter stated she would call his kiney doctor and see if they want to see him. I told her that would be fine. I can only speak for the bladder not the Kidney.   PVR 67ml

## 2017-08-30 NOTE — ED Provider Notes (Signed)
[facility] Emergency Department Provider Note       Time seen: ----------------------------------------- 1:21 PM on 08/30/2017 -----------------------------------------   I have reviewed the triage vital signs and the nursing notes.  HISTORY   Chief Complaint Urinary Retention    HPI Max Ellis is a 84 y.o. male with a history of COPD, hypertension, peripheral vascular disease, stage IV chronic kidney disease who presents to the ED for urinary retention and dehydration.  Patient has saw the urologist this morning encouraged to come to the ER for evaluation.  He reports frequent urination in small amounts.  Family reports he is taking adequate intake in.  He is not on dialysis, there was concerns because he is taking adequate intake but not having good output.  He has had recent catheterizations for same.  Past Medical History:  Diagnosis Date  . COPD (chronic obstructive pulmonary disease) ([place])   . Hypertension   . Peripheral vascular disease ([place])   . Renal disorder   . Stage 4 chronic kidney disease [facility])     Patient Active Problem List   Diagnosis Date Noted  . Essential hypertension 01/26/2017  . Tobacco use disorder 01/26/2017  . Pain in limb 01/26/2017    History reviewed. No pertinent surgical history.  Allergies Patient has no known allergies.  Social History Social History   Tobacco Use  . Smoking status: Current Every Day Smoker    Types: Cigarettes  . Smokeless tobacco: Former Systems developer    Types: Chew  Substance Use Topics  . Alcohol use: No  . Drug use: No    Review of Systems Constitutional: Negative for fever. Cardiovascular: Negative for chest pain. Respiratory: Negative for shortness of breath. Gastrointestinal: Negative for abdominal pain, vomiting and diarrhea. Genitourinary: Positive for polyuria, but oliguria Musculoskeletal: Negative for back pain. Skin: Negative for rash. Neurological: Negative for headaches,  focal weakness or numbness.  All systems negative/normal/unremarkable except as stated in the HPI  ____________________________________________   PHYSICAL EXAM:  VITAL SIGNS: ED Triage Vitals  Enc Vitals Group     BP 08/30/17 1233 137/70     Pulse Rate 08/30/17 1233 73     Resp 08/30/17 1233 18     Temp 08/30/17 1233 (!) 97.5 F (36.4 C)     Temp Source 08/30/17 1233 Oral     SpO2 08/30/17 1233 100 %     Weight 08/30/17 1234 158 lb (71.7 kg)     Height 08/30/17 1234 6\' 2"  (1.88 m)     Head Circumference --      Peak Flow --      Pain Score 08/30/17 1250 2     Pain Loc --      Pain Edu? --      Excl. in [place]? --     Constitutional: Alert and oriented. Well appearing and in no distress. Eyes: Conjunctivae are normal. Normal extraocular movements. Cardiovascular: Normal rate, regular rhythm. No murmurs, rubs, or gallops. Respiratory: Normal respiratory effort without tachypnea nor retractions. Breath sounds are clear and equal bilaterally. No wheezes/rales/rhonchi. Gastrointestinal: Soft and nontender. Normal bowel sounds Musculoskeletal: Nontender with normal range of motion in extremities. No lower extremity tenderness nor edema. Neurologic:  Normal speech and language. No gross focal neurologic deficits are appreciated.  Skin:  Skin is warm, dry and intact. No rash noted. Psychiatric: Mood and affect are normal. Speech and behavior are normal.  ____________________________________________  ED COURSE:  As part of my medical decision making, I reviewed the  following data within the [place] History obtained from family if available, nursing notes, old chart and ekg, as well as notes from prior ED visits. Patient presented for urinary retention and possibly dehydration, we will assess with labs and imaging as indicated at this time.   Procedures ____________________________________________   LABS (pertinent positives/negatives)  Labs Reviewed   URINALYSIS, COMPLETE (UACMP) WITH MICROSCOPIC - Abnormal; Notable for the following components:      Result Value   Color, Urine YELLOW (*)    APPearance HAZY (*)    Hgb urine dipstick SMALL (*)    Nitrite POSITIVE (*)    Leukocytes, UA LARGE (*)    Bacteria, UA FEW (*)    All other components within normal limits  CBC - Abnormal; Notable for the following components:   RBC 3.75 (*)    Hemoglobin 11.2 (*)    HCT 34.3 (*)    RDW 14.9 (*)    All other components within normal limits  BASIC METABOLIC PANEL - Abnormal; Notable for the following components:   Sodium 132 (*)    Potassium 5.5 (*)    Glucose, Bld 106 (*)    BUN 22 (*)    Creatinine, Ser 2.11 (*)    GFR calc non Af Amer 28 (*)    GFR calc Af Amer 32 (*)    All other components within normal limits  BASIC METABOLIC PANEL - Abnormal; Notable for the following components:   Sodium 132 (*)    Potassium 5.2 (*)    Chloride 100 (*)    Glucose, Bld 101 (*)    BUN 21 (*)    Creatinine, Ser 2.13 (*)    GFR calc non Af Amer 27 (*)    GFR calc Af Amer 32 (*)    All other components within normal limits  URINE CULTURE  ____________________________________________  DIFFERENTIAL DIAGNOSIS   Dehydration, BPH, acute urinary retention, UTI, electrolyte abnormality  FINAL ASSESSMENT AND PLAN  Urinary tract infection   Plan: Patient had presented for possible urinary retention. Patient's labs revealed likely dehydration and mild hyperkalemia.  This has improved after saline.  We gave him a dose of IV Rocephin and have sent a urine culture.  I discussed with urology and they agree he does not need urine catheterization.  He will be discharged with antibiotics and encouraged to increase oral intake.  He is stable for outpatient follow-up.  Urology states that they will follow-up with him as an outpatient   Earleen Newport, MD   Note: This note was generated in part or whole with voice recognition software. Voice  recognition is usually quite accurate but there are transcription errors that can and very often do occur. I apologize for any typographical errors that were not detected and corrected.     Earleen Newport, MD 08/30/17 [phone]

## 2017-08-30 NOTE — ED Triage Notes (Signed)
Family reports patient is here for urinary retention and dehydration. Patient saw urologist this morning and they did not catherterize him due to about 67mL in his bladder and told him to come to ER. HX stage 4 kidney disease. Patient reports he last urinated this morning very little. Denies burning during urination this AM. Patient A&O x4 in triage. Family reports patient is not on dialysis. Family is concerned because patient is drinking adequate fluids daily but not urinating

## 2017-09-02 LAB — URINE CULTURE

## 2018-03-04 ENCOUNTER — Encounter (INDEPENDENT_AMBULATORY_CARE_PROVIDER_SITE_OTHER): Payer: Medicare Other

## 2018-03-04 ENCOUNTER — Ambulatory Visit (INDEPENDENT_AMBULATORY_CARE_PROVIDER_SITE_OTHER): Payer: Medicare Other | Admitting: Vascular Surgery

## 2018-04-19 ENCOUNTER — Encounter (INDEPENDENT_AMBULATORY_CARE_PROVIDER_SITE_OTHER): Payer: Medicare Other

## 2018-04-19 ENCOUNTER — Ambulatory Visit (INDEPENDENT_AMBULATORY_CARE_PROVIDER_SITE_OTHER): Payer: Medicare Other | Admitting: Vascular Surgery

## 2018-04-19 ENCOUNTER — Encounter (INDEPENDENT_AMBULATORY_CARE_PROVIDER_SITE_OTHER): Payer: Self-pay

## 2019-01-11 ENCOUNTER — Emergency Department: Payer: Medicare Other

## 2019-01-11 ENCOUNTER — Other Ambulatory Visit: Payer: Self-pay

## 2019-01-11 ENCOUNTER — Emergency Department
Admission: EM | Admit: 2019-01-11 | Discharge: 2019-01-11 | Disposition: A | Discharge status: Home / Self Care | Payer: Medicare Other | Attending: Emergency Medicine | Admitting: Emergency Medicine

## 2019-01-11 ENCOUNTER — Encounter: Payer: Self-pay | Admitting: Emergency Medicine

## 2019-01-11 DIAGNOSIS — F17210 Nicotine dependence, cigarettes, uncomplicated: Secondary | ICD-10-CM | POA: Insufficient documentation

## 2019-01-11 DIAGNOSIS — R079 Chest pain, unspecified: Secondary | ICD-10-CM | POA: Diagnosis present

## 2019-01-11 DIAGNOSIS — I129 Hypertensive chronic kidney disease with stage 1 through stage 4 chronic kidney disease, or unspecified chronic kidney disease: Secondary | ICD-10-CM | POA: Insufficient documentation

## 2019-01-11 DIAGNOSIS — N184 Chronic kidney disease, stage 4 (severe): Secondary | ICD-10-CM | POA: Diagnosis not present

## 2019-01-11 DIAGNOSIS — J441 Chronic obstructive pulmonary disease with (acute) exacerbation: Secondary | ICD-10-CM | POA: Diagnosis not present

## 2019-01-11 LAB — BASIC METABOLIC PANEL
Anion gap: 9 (ref 5–15)
BUN: 26 mg/dL — ABNORMAL HIGH (ref 8–23)
CO2: 25 mmol/L (ref 22–32)
Calcium: 8.7 mg/dL — ABNORMAL LOW (ref 8.9–10.3)
Chloride: 98 mmol/L (ref 98–111)
Creatinine, Ser: 2.78 mg/dL — ABNORMAL HIGH (ref 0.61–1.24)
GFR calc Af Amer: 23 mL/min — ABNORMAL LOW (ref >60)
GFR calc non Af Amer: 20 mL/min — ABNORMAL LOW (ref >60)
Glucose, Bld: 104 mg/dL — ABNORMAL HIGH (ref 70–99)
Potassium: 4.8 mmol/L (ref 3.5–5.1)
Sodium: 132 mmol/L — ABNORMAL LOW (ref 135–145)

## 2019-01-11 LAB — TROPONIN I: Troponin I: <0.03 ng/mL (ref <0.03)

## 2019-01-11 LAB — CBC
HCT: 32.6 % — ABNORMAL LOW (ref 39.0–52.0)
Hemoglobin: 10.9 g/dL — ABNORMAL LOW (ref 13.0–17.0)
MCH: 31.2 pg (ref 26.0–34.0)
MCHC: 33.4 g/dL (ref 30.0–36.0)
MCV: 93.4 fL (ref 80.0–100.0)
Platelets: 167 10*3/uL (ref 150–400)
RBC: 3.49 MIL/uL — ABNORMAL LOW (ref 4.22–5.81)
RDW: 12.9 % (ref 11.5–15.5)
WBC: 6.3 10*3/uL (ref 4.0–10.5)
nRBC: 0 % (ref 0.0–0.2)

## 2019-01-11 MED ORDER — LEVOFLOXACIN 750 MG PO TABS: 750.0000 mg | ORAL_TABLET | Freq: Every day | ORAL | 0 refills | Status: DC

## 2019-01-11 MED ORDER — LEVOFLOXACIN 750 MG PO TABS
750.0000 mg | ORAL_TABLET | Freq: Once | ORAL | Status: AC
  Administered 2019-01-11: 750 mg via ORAL
  Filled 2019-01-11: qty 1

## 2019-01-11 MED ORDER — IPRATROPIUM-ALBUTEROL 0.5-2.5 (3) MG/3ML IN SOLN
3.0000 mL | Freq: Once | RESPIRATORY_TRACT | Status: AC
  Administered 2019-01-11: 3 mL via RESPIRATORY_TRACT
  Filled 2019-01-11: qty 3

## 2019-01-11 MED ORDER — PREDNISONE 10 MG (21) PO TBPK: ORAL_TABLET | ORAL | 0 refills | Status: DC

## 2019-01-11 MED ORDER — SODIUM CHLORIDE 0.9% FLUSH: 3.0000 mL | Freq: Once | INTRAVENOUS | Status: DC

## 2019-01-11 MED ORDER — PREDNISONE 20 MG PO TABS
60.0000 mg | ORAL_TABLET | Freq: Once | ORAL | Status: AC
  Administered 2019-01-11: 60 mg via ORAL
  Filled 2019-01-11: qty 3

## 2019-01-11 NOTE — ED Provider Notes (Signed)
[facility] Emergency Department Provider Note       Time seen: ----------------------------------------- 7:51 PM on 01/11/2019 -----------------------------------------   I have reviewed the triage vital signs and the nursing notes.  HISTORY   Chief Complaint No chief complaint on file.   HPI Max Ellis is a 85 y.o. male with a history of COPD, hypertension, peripheral vascular disease, stage IV chronic kidney disease who presents to the ED for chest pain that is worse with deep breathing and cough.  Pain is 6 out of 10 in the anterior chest and described as soreness.  Patient still smokes.  Past Medical History:  Diagnosis Date  . COPD (chronic obstructive pulmonary disease) ([place])   . Hypertension   . Peripheral vascular disease ([place])   . Renal disorder   . Stage 4 chronic kidney disease [facility])     Patient Active Problem List   Diagnosis Date Noted  . Essential hypertension 01/26/2017  . Tobacco use disorder 01/26/2017  . Pain in limb 01/26/2017    History reviewed. No pertinent surgical history.  Allergies Patient has no known allergies.  Social History Social History   Tobacco Use  . Smoking status: Current Every Day Smoker    Types: Cigarettes  . Smokeless tobacco: Former Systems developer    Types: Chew  Substance Use Topics  . Alcohol use: No  . Drug use: No   Review of Systems Constitutional: Negative for fever. Cardiovascular: Negative for chest pain. Respiratory: Positive for shortness of breath and cough Gastrointestinal: Negative for abdominal pain, vomiting and diarrhea. Genitourinary: Negative for dysuria. Musculoskeletal: Negative for back pain. Skin: Negative for rash. Neurological: Negative for headaches, focal weakness or numbness.  All systems negative/normal/unremarkable except as stated in the HPI  ____________________________________________   PHYSICAL EXAM:  VITAL SIGNS: ED Triage Vitals  Enc Vitals Group   BP 01/11/19 1727 131/71     Pulse Rate 01/11/19 1727 80     Resp 01/11/19 1727 16     Temp 01/11/19 1727 97.7 F (36.5 C)     Temp Source 01/11/19 1727 Oral     SpO2 01/11/19 1727 99 %     Weight 01/11/19 1727 155 lb (70.3 kg)     Height 01/11/19 1727 6\' 2"  (1.88 m)     Head Circumference --      Peak Flow --      Pain Score 01/11/19 1726 6     Pain Loc --      Pain Edu? --      Excl. in [place]? --    Constitutional: Alert and oriented. Well appearing and in no distress. Eyes: Conjunctivae are normal. Normal extraocular movements. Cardiovascular: Normal rate, regular rhythm. No murmurs, rubs, or gallops. Respiratory: Scattered rales and rhonchi Gastrointestinal: Soft and nontender. Normal bowel sounds Musculoskeletal: Nontender with normal range of motion in extremities. No lower extremity tenderness nor edema. Neurologic:  Normal speech and language. No gross focal neurologic deficits are appreciated.  Skin:  Skin is warm, dry and intact. No rash noted. Psychiatric: Mood and affect are normal. Speech and behavior are normal.  ____________________________________________  EKG: Interpreted by me.  Sinus rhythm with rate of 72 bpm, normal PR interval, normal QRS, normal QT  ____________________________________________  ED COURSE:  As part of my medical decision making, I reviewed the following data within the [place] History obtained from family if available, nursing notes, old chart and ekg, as well as notes from prior ED visits. Patient presented  for shortness of breath, we will assess with labs and imaging as indicated at this time.   Procedures  Max Ellis was evaluated in Emergency Department on 01/11/2019 for the symptoms described in the history of present illness. He was evaluated in the context of the global COVID-19 pandemic, which necessitated consideration that the patient might be at risk for infection with the SARS-CoV-2 virus that causes COVID-19.  Institutional protocols and algorithms that pertain to the evaluation of patients at risk for COVID-19 are in a state of rapid change based on information released by regulatory bodies including the CDC and federal and state organizations. These policies and algorithms were followed during the patient's care in the ED.  ____________________________________________   LABS (pertinent positives/negatives)  Labs Reviewed  BASIC METABOLIC PANEL - Abnormal; Notable for the following components:      Result Value   Sodium 132 (*)    Glucose, Bld 104 (*)    BUN 26 (*)    Creatinine, Ser 2.78 (*)    Calcium 8.7 (*)    GFR calc non Af Amer 20 (*)    GFR calc Af Amer 23 (*)    All other components within normal limits  CBC - Abnormal; Notable for the following components:   RBC 3.49 (*)    Hemoglobin 10.9 (*)    HCT 32.6 (*)    All other components within normal limits  TROPONIN I    RADIOLOGY Images were viewed by me  Chest x-ray/CT chest without Reveals right lung apex bronchiectasis with likely scarring and fibrosis, mild emphysema, borderline lymph nodes  ____________________________________________   DIFFERENTIAL DIAGNOSIS   CHF, COPD, pneumonia, coronavirus  FINAL ASSESSMENT AND PLAN  Dyspnea, COPD exacerbation   Plan: The patient had presented for shortness of breath. Patient's labs revealed a mild increase in creatinine compared to prior. Patient's imaging was mostly reassuring although he did have some progression of his right apex scarring.  He will be discharged with antibiotics and steroids and encouraged to have close outpatient follow-up with his doctor.   Laurence Aly, MD    Note: This note was generated in part or whole with voice recognition software. Voice recognition is usually quite accurate but there are transcription errors that can and very often do occur. I apologize for any typographical errors that were not detected and corrected.     Earleen Newport, MD 01/11/19 2226

## 2019-01-11 NOTE — ED Triage Notes (Signed)
C/O chest pain and  Prostate gland trouble x 3-4 days.  Chest pain worse with deep breathing and cough.

## 2019-01-11 NOTE — ED Notes (Signed)
Granddaughter picked up patient and given an update as requested by patient

## 2019-01-11 NOTE — ED Notes (Signed)
Assisted pt to the bathroom, pt voided.

## 2019-01-11 NOTE — ED Notes (Signed)
Max Ellis, Granddaughter.  [phone]

## 2019-01-18 ENCOUNTER — Other Ambulatory Visit: Payer: Self-pay | Admitting: Family Medicine

## 2019-01-18 DIAGNOSIS — R911 Solitary pulmonary nodule: Secondary | ICD-10-CM

## 2019-01-18 DIAGNOSIS — Z09 Encounter for follow-up examination after completed treatment for conditions other than malignant neoplasm: Secondary | ICD-10-CM

## 2019-04-13 ENCOUNTER — Ambulatory Visit
Admission: RE | Admit: 2019-04-13 | Discharge: 2019-04-13 | Disposition: A | Discharge status: Home / Self Care | Payer: Medicare Other | Source: Ambulatory Visit | Attending: Family Medicine | Admitting: Family Medicine

## 2019-04-13 ENCOUNTER — Other Ambulatory Visit: Payer: Self-pay

## 2019-04-13 DIAGNOSIS — R911 Solitary pulmonary nodule: Secondary | ICD-10-CM

## 2019-04-13 DIAGNOSIS — Z09 Encounter for follow-up examination after completed treatment for conditions other than malignant neoplasm: Secondary | ICD-10-CM | POA: Diagnosis present

## 2019-06-09 ENCOUNTER — Other Ambulatory Visit: Payer: Self-pay | Admitting: Oncology

## 2019-06-09 DIAGNOSIS — R911 Solitary pulmonary nodule: Secondary | ICD-10-CM

## 2019-06-09 NOTE — Progress Notes (Signed)
  Pulmonary Nodule Clinic Telephone Note  Received referral from Dr. Chrystine Oiler.   Patient had follow-up CT chest without contrast  for a lung nodule that was identified back in June 2020.  CT scan from 04/13/2019 revealed a stable asymmetric irregular density in right lung apex most consistent with scarring but continued follow-up is recommended to rule out neoplasm.  Stable 6 mm nodule is noted in right upper lobe as well.  Follow-up noncontrasted chest CT in 12 months is recommended to ensure stability.  He was evaluated in the emergency room on 01/11/2019 for chest pain and shortness of breath.  Subsequent imaging revealed mild architectural distortion in the right lung apex with associated mild bronchiectasis with small central cavitation.  Suspect that this may represent pulmonary scarring and fibrosis, however appears progressed.  Scattered pulmonary nodules including a 6 mm slightly spiculated nodule in the right upper lobe.  Attempt was made to retrieve comparison images from 2001 but was unsuccessful.  Short-term follow-up 3 to 6 months with a noncontrasted CT was recommended.  I personally reviewed all patient's previous imaging including most recent from 04/13/2019.  I recommend follow-up with noncontrasted CT scan of the chest in approximately 12 months from previous.  Patient is a current everyday smoker.   High risk factors include: History of heavy smoking, exposure to asbestos, radium or uranium, personal family history of lung cancer, older age, sex (females greater than males), race (black and native [place] greater than weight), marginal speculation, upper lobe location, multiplicity (less than 5 nodules increases risk for malignancy) and emphysema and/or pulmonary fibrosis.   This recommendation follows the consensus statement: Guidelines for Management of Incidental Pulmonary Nodules Detected on CT Images: From the Fleischner Society 2017; Radiology 2017; 284:228-243.    I have  placed order for CT scan without contrast to be completed approximately 12  from previous CT scan.    I will touch base with patient and schedule him/her virtually for results of the CT scan and recommendations per Fleischner's guidelines and our pulmonary nodule clinic.  Faythe Casa, NP 06/09/2019 1:30 PM

## 2019-06-23 ENCOUNTER — Telehealth: Payer: Self-pay | Admitting: *Deleted

## 2019-06-23 NOTE — Telephone Encounter (Signed)
Referral received for pt to be seen in Lung Nodule Clinic for further workup of incidental lung nodule with follow up CT scan. Left message with patient to call back to discuss clinic and review recommendations and upcoming appts including follow up CT scan and visit with Jenny Burns, NP to discuss results. Awaiting call back.  

## 2020-01-10 ENCOUNTER — Telehealth: Payer: Self-pay | Admitting: *Deleted

## 2020-01-10 NOTE — Telephone Encounter (Signed)
Second attempt to contact pt to review referral to the lung nodule clinic as well as upcoming appts. Left message instructing pt to call back to further discuss upcoming appts.

## 2020-02-29 ENCOUNTER — Other Ambulatory Visit: Payer: Self-pay

## 2020-02-29 ENCOUNTER — Ambulatory Visit (INDEPENDENT_AMBULATORY_CARE_PROVIDER_SITE_OTHER): Payer: Medicare Other | Admitting: Urology

## 2020-02-29 ENCOUNTER — Encounter: Payer: Self-pay | Admitting: Urology

## 2020-02-29 VITALS — BP 114/65 | HR 67 | Ht 74.0 in | Wt 144.0 lb

## 2020-02-29 DIAGNOSIS — R300 Dysuria: Secondary | ICD-10-CM

## 2020-02-29 DIAGNOSIS — R102 Pelvic and perineal pain: Secondary | ICD-10-CM | POA: Diagnosis not present

## 2020-02-29 DIAGNOSIS — R3914 Feeling of incomplete bladder emptying: Secondary | ICD-10-CM

## 2020-02-29 DIAGNOSIS — N401 Enlarged prostate with lower urinary tract symptoms: Secondary | ICD-10-CM

## 2020-02-29 LAB — BLADDER SCAN AMB NON-IMAGING: Scan Result: 121

## 2020-03-01 LAB — URINALYSIS, COMPLETE
Bilirubin, UA: NEGATIVE
Glucose, UA: NEGATIVE
Ketones, UA: NEGATIVE
Leukocytes,UA: NEGATIVE
Nitrite, UA: NEGATIVE
Protein,UA: NEGATIVE
RBC, UA: NEGATIVE
Specific Gravity, UA: 1.01 (ref 1.005–1.030)
Urobilinogen, Ur: 0.2 mg/dL (ref 0.2–1.0)
pH, UA: 5.5 (ref 5.0–7.5)

## 2020-03-01 LAB — MICROSCOPIC EXAMINATION
Bacteria, UA: NONE SEEN
RBC: NONE SEEN /hpf (ref 0–2)

## 2020-03-03 ENCOUNTER — Encounter: Payer: Self-pay | Admitting: Urology

## 2020-03-03 DIAGNOSIS — N401 Enlarged prostate with lower urinary tract symptoms: Secondary | ICD-10-CM | POA: Insufficient documentation

## 2020-03-03 NOTE — Progress Notes (Signed)
02/29/2020 12:00 PM   Max Ellis June 27, 1934 [id number]  Referring provider: [place] Pink, MD [address] [facility] [place],  [place] [address]  Chief Complaint  Patient presents with  . Benign Prostatic Hypertrophy    HPI: 86 y.o. male presents for evaluation of pelvic pain and dysuria.   Last visit 08/2017 by Dr. Pilar Jarvis for BPH and episode urinary retention  Was on tamsulosin/finasteride and tamsulosin increased 0.8 mg  Initial voiding trial unsuccessful and catheter replaced, subsequent voiding trial successful with PVR 71 mL   Present today with a 1-2-week history of dysuria and suprapubic pain  No flank or abdominal pain  Denies gross hematuria  Remains on tamsulosin and finasteride   PMH: Past Medical History:  Diagnosis Date  . COPD (chronic obstructive pulmonary disease) ([place])   . Hypertension   . Peripheral vascular disease ([place])   . Renal disorder   . Stage 4 chronic kidney disease [facility])     Surgical History: History reviewed. No pertinent surgical history.  Home Medications:  Allergies as of 02/29/2020   No Known Allergies     Medication List       Accurate as of February 29, 2020 11:59 PM. If you have any questions, ask your nurse or doctor.        aspirin EC 81 MG tablet Take 81 mg by mouth daily.   calcitRIOL 0.25 MCG capsule Commonly known as: ROCALTROL Take by mouth.   cholecalciferol 25 MCG (1000 UNIT) tablet Commonly known as: VITAMIN D3 Take 1,000 Units by mouth daily.   citalopram 10 MG tablet Commonly known as: CELEXA Take 10 mg by mouth daily.   famotidine 20 MG tablet Commonly known as: PEPCID Take by mouth.   finasteride 5 MG tablet Commonly known as: PROSCAR Take 5 mg by mouth daily.   furosemide 20 MG tablet Commonly known as: LASIX Take 20-40 mg by mouth daily.   latanoprost 0.005 % ophthalmic solution Commonly known as: XALATAN 1 drop at bedtime.   levofloxacin 750 MG tablet Commonly  known as: Levaquin Take 1 tablet (750 mg total) by mouth daily.   predniSONE 10 MG (21) Tbpk tablet Commonly known as: STERAPRED UNI-PAK 21 TAB Dispense steroid taper pack as directed   tamsulosin 0.4 MG Caps capsule Commonly known as: FLOMAX Take 0.8 mg by mouth daily.   traZODone 50 MG tablet Commonly known as: DESYREL Take 50 mg by mouth at bedtime.       Allergies: No Known Allergies  Family History: History reviewed. No pertinent family history.  Social History:  reports that he has been smoking cigarettes. He has quit using smokeless tobacco.  His smokeless tobacco use included chew. He reports that he does not drink alcohol and does not use drugs.   Physical Exam: BP 114/65   Pulse 67   Ht 6\' 2"  (1.88 m)   Wt 144 lb (65.3 kg)   BMI 18.49 kg/m   Constitutional:  Alert, No acute distress. HEENT: [place] AT, moist mucus membranes.  Trachea midline, no masses. Cardiovascular: No clubbing, cyanosis, or edema. Respiratory: Normal respiratory effort, no increased work of breathing. GI: Abdomen is soft, nontender, nondistended, no abdominal masses GU: Prostate 40 g, smooth without nodules Skin: No rashes, bruises or suspicious lesions. Neurologic: Grossly intact, no focal deficits, moving all 4 extremities. Psychiatric: Normal mood and affect.  Laboratory Data:  Urinalysis Dipstick/microscopy negative   Assessment & Plan:    1.  BPH with incomplete bladder emptying  PVR by bladder scan 121 mL  Continue tamsulosin/finasteride  2.  Dysuria  Urinalysis today clear  3.  Pelvic pain  Schedule renal ultrasound and cystoscopy   Abbie Sons, MD  [place] [address] [place], [place] [address] [phone]

## 2020-03-04 ENCOUNTER — Telehealth: Payer: Self-pay | Admitting: *Deleted

## 2020-03-04 NOTE — Telephone Encounter (Signed)
Unable to contact pt by phone. Appts mailed for the lung nodule follow up. Contact info included for pt to call back to reschedule if needed.

## 2020-03-22 ENCOUNTER — Ambulatory Visit
Admission: RE | Admit: 2020-03-22 | Discharge: 2020-03-22 | Disposition: A | Discharge status: Home / Self Care | Payer: Medicare Other | Source: Ambulatory Visit | Attending: Urology | Admitting: Urology

## 2020-03-22 ENCOUNTER — Other Ambulatory Visit: Payer: Self-pay

## 2020-03-22 DIAGNOSIS — R102 Pelvic and perineal pain: Secondary | ICD-10-CM | POA: Diagnosis present

## 2020-04-03 ENCOUNTER — Other Ambulatory Visit: Payer: Self-pay

## 2020-04-03 ENCOUNTER — Encounter: Payer: Self-pay | Admitting: Urology

## 2020-04-03 ENCOUNTER — Ambulatory Visit: Payer: Medicare Other | Admitting: Urology

## 2020-04-03 VITALS — BP 142/60 | HR 66 | Ht 74.0 in | Wt 144.0 lb

## 2020-04-03 DIAGNOSIS — R300 Dysuria: Secondary | ICD-10-CM | POA: Diagnosis not present

## 2020-04-03 DIAGNOSIS — R3914 Feeling of incomplete bladder emptying: Secondary | ICD-10-CM

## 2020-04-03 MED ORDER — DOXYCYCLINE HYCLATE 100 MG PO CAPS: 100.0000 mg | ORAL_CAPSULE | Freq: Two times a day (BID) | ORAL | 0 refills | Status: DC

## 2020-04-03 NOTE — Progress Notes (Signed)
   04/03/20  CC:  Chief Complaint  Patient presents with  . Cysto    HPI: Refer to my prior office note 02/29/2020.  No change in symptoms and complains of burning with urination and at the end of urination  Renal ultrasound showed bilateral cortical thinning and diffuse bladder wall thickening  Blood pressure (!) 142/60, pulse 66, height 6\' 2"  (1.88 m), weight 144 lb (65.3 kg). NED. A&Ox3.   No respiratory distress   Abd soft, NT, ND Normal phallus with bilateral descended testicles  Cystoscopy Procedure Note  Patient identification was confirmed, informed consent was obtained, and patient was prepped using Betadine solution.  Lidocaine jelly was administered per urethral meatus.     Pre-Procedure: - Inspection reveals a normal caliber urethral meatus.  Procedure: The flexible cystoscope was introduced without difficulty - No urethral strictures/lesions are present. - Moderate lateral lobe enlargement prostate  - Normal bladder neck - Bilateral ureteral orifices identified - Bladder mucosa  reveals scattered areas cystitis cystica trigone - No bladder stones -Trabeculated bladder with cellule formation  Retroflexion shows mild and vesical median lobe and cystitis cystica as described   Post-Procedure: - Patient tolerated the procedure well  Assessment/ Plan:  Scattered cystitis cystica trigone  UA unremarkable  Trial empiric doxycycline  Call for persistent symptoms after completion of antibiotics   Abbie Sons, MD

## 2020-04-04 LAB — MICROSCOPIC EXAMINATION
Bacteria, UA: NONE SEEN
WBC, UA: NONE SEEN /hpf (ref 0–5)

## 2020-04-04 LAB — URINALYSIS, COMPLETE
Bilirubin, UA: NEGATIVE
Glucose, UA: NEGATIVE
Ketones, UA: NEGATIVE
Leukocytes,UA: NEGATIVE
Nitrite, UA: NEGATIVE
Protein,UA: NEGATIVE
RBC, UA: NEGATIVE
Specific Gravity, UA: 1.02 (ref 1.005–1.030)
Urobilinogen, Ur: 0.2 mg/dL (ref 0.2–1.0)
pH, UA: 5.5 (ref 5.0–7.5)

## 2020-04-15 ENCOUNTER — Other Ambulatory Visit: Payer: Self-pay

## 2020-04-15 ENCOUNTER — Ambulatory Visit
Admission: RE | Admit: 2020-04-15 | Discharge: 2020-04-15 | Disposition: A | Discharge status: Home / Self Care | Payer: Medicare Other | Source: Ambulatory Visit | Attending: Oncology | Admitting: Oncology

## 2020-04-15 DIAGNOSIS — R911 Solitary pulmonary nodule: Secondary | ICD-10-CM | POA: Diagnosis present

## 2020-04-16 ENCOUNTER — Encounter: Payer: Self-pay | Admitting: Physician Assistant

## 2020-04-16 ENCOUNTER — Ambulatory Visit (INDEPENDENT_AMBULATORY_CARE_PROVIDER_SITE_OTHER): Payer: Medicare Other | Admitting: Physician Assistant

## 2020-04-16 ENCOUNTER — Inpatient Hospital Stay: Payer: Medicare Other | Attending: Oncology | Admitting: Oncology

## 2020-04-16 VITALS — BP 110/58 | HR 66 | Ht 74.0 in | Wt 143.0 lb

## 2020-04-16 DIAGNOSIS — R300 Dysuria: Secondary | ICD-10-CM | POA: Diagnosis not present

## 2020-04-16 DIAGNOSIS — J449 Chronic obstructive pulmonary disease, unspecified: Secondary | ICD-10-CM | POA: Diagnosis not present

## 2020-04-16 DIAGNOSIS — R911 Solitary pulmonary nodule: Secondary | ICD-10-CM | POA: Diagnosis present

## 2020-04-16 DIAGNOSIS — Z7982 Long term (current) use of aspirin: Secondary | ICD-10-CM | POA: Diagnosis not present

## 2020-04-16 DIAGNOSIS — F17210 Nicotine dependence, cigarettes, uncomplicated: Secondary | ICD-10-CM | POA: Insufficient documentation

## 2020-04-16 DIAGNOSIS — N184 Chronic kidney disease, stage 4 (severe): Secondary | ICD-10-CM | POA: Insufficient documentation

## 2020-04-16 DIAGNOSIS — Z79899 Other long term (current) drug therapy: Secondary | ICD-10-CM | POA: Insufficient documentation

## 2020-04-16 DIAGNOSIS — I739 Peripheral vascular disease, unspecified: Secondary | ICD-10-CM | POA: Insufficient documentation

## 2020-04-16 DIAGNOSIS — I129 Hypertensive chronic kidney disease with stage 1 through stage 4 chronic kidney disease, or unspecified chronic kidney disease: Secondary | ICD-10-CM | POA: Insufficient documentation

## 2020-04-16 LAB — BLADDER SCAN AMB NON-IMAGING

## 2020-04-16 LAB — URINALYSIS, COMPLETE
Bilirubin, UA: NEGATIVE
Glucose, UA: NEGATIVE
Ketones, UA: NEGATIVE
Leukocytes,UA: NEGATIVE
Nitrite, UA: NEGATIVE
Protein,UA: NEGATIVE
Specific Gravity, UA: 1.02 (ref 1.005–1.030)
Urobilinogen, Ur: 0.2 mg/dL (ref 0.2–1.0)
pH, UA: 5 (ref 5.0–7.5)

## 2020-04-16 LAB — MICROSCOPIC EXAMINATION
Bacteria, UA: NONE SEEN
Epithelial Cells (non renal): NONE SEEN /hpf (ref 0–10)

## 2020-04-16 NOTE — Progress Notes (Signed)
04/16/2020 3:21 PM   Max Ellis 28-Dec-1933 [id number]  CC: Chief Complaint  Patient presents with  . Dysuria    HPI: Max Ellis is a 86 y.o. male with PMH CKD4, BPH with a recent history of urinary retention on Flomax and finasteride, and pelvic pain and dysuria who underwent cystoscopy with Dr. Bernardo Heater 13 days ago with findings of moderate prostatic lateral lobe enlargement and cystitis cystica who presents today for evaluation of possible UTI.  UA at the time was benign and he was prescribed empiric doxycycline 100mg  BID x7 days for symptom management, however today he states doxycycline caused nausea and vomiting and he discontinued it after 5 days. He is accompanied today by his daughter, who contributes to HPI.  Today he reports worsened dysuria, urgency, and difficulty urinating since cystoscopy. He states his bladder burns at baseline, worse with urination.  He has taken Tylenol with some symptomatic improvement.  He is already taking Flomax 0.8 mg daily.  In-office catheterized UA today positive for 2+ blood; urine microscopy with 3-10 RBCs/HPF. PVR 36mL.  PMH: Past Medical History:  Diagnosis Date  . COPD (chronic obstructive pulmonary disease) ([place])   . Hypertension   . Peripheral vascular disease ([place])   . Renal disorder   . Stage 4 chronic kidney disease [facility])     Surgical History: No past surgical history on file.  Home Medications:  Allergies as of 04/16/2020   No Known Allergies     Medication List       Accurate as of April 16, 2020  3:21 PM. If you have any questions, ask your nurse or doctor.        aspirin EC 81 MG tablet Take 81 mg by mouth daily.   ASPIRIN 81 PO Take by mouth.   calcitRIOL 0.25 MCG capsule Commonly known as: ROCALTROL Take by mouth.   cholecalciferol 25 MCG (1000 UNIT) tablet Commonly known as: VITAMIN D3 Take 1,000 Units by mouth daily.   citalopram 10 MG tablet Commonly known as: CELEXA Take 10 mg by mouth  daily.   doxycycline 100 MG capsule Commonly known as: VIBRAMYCIN Take 1 capsule (100 mg total) by mouth every 12 (twelve) hours.   famotidine 20 MG tablet Commonly known as: PEPCID Take by mouth.   finasteride 5 MG tablet Commonly known as: PROSCAR Take 5 mg by mouth daily.   furosemide 20 MG tablet Commonly known as: LASIX Take 20-40 mg by mouth daily.   latanoprost 0.005 % ophthalmic solution Commonly known as: XALATAN 1 drop at bedtime.   predniSONE 10 MG (21) Tbpk tablet Commonly known as: STERAPRED UNI-PAK 21 TAB Dispense steroid taper pack as directed   tamsulosin 0.4 MG Caps capsule Commonly known as: FLOMAX Take 0.8 mg by mouth daily.   traZODone 50 MG tablet Commonly known as: DESYREL Take 50 mg by mouth at bedtime.       Allergies:  No Known Allergies  Family History: No family history on file.  Social History:   reports that he has been smoking cigarettes. He has quit using smokeless tobacco.  His smokeless tobacco use included chew. He reports that he does not drink alcohol and does not use drugs.  Physical Exam: BP (!) 110/58 (BP Location: Left Arm, Patient Position: Sitting, Cuff Size: Normal)   Pulse 66   Ht 6\' 2"  (1.88 m)   Wt 143 lb (64.9 kg)   BMI 18.36 kg/m   Constitutional:  Alert and oriented, no acute distress,  nontoxic appearing HEENT: [place], AT Cardiovascular: No clubbing, cyanosis, or edema Respiratory: Normal respiratory effort, no increased work of breathing Skin: No rashes, bruises or suspicious lesions Neurologic: Grossly intact, no focal deficits, moving all 4 extremities Psychiatric: Normal mood and affect  Laboratory Data: Results for orders placed or performed in visit on 04/16/20  Microscopic Examination   Urine  Result Value Ref Range   WBC, UA 0-5 0 - 5 /hpf   RBC 3-10 (A) 0 - 2 /hpf   Epithelial Cells (non renal) None seen 0 - 10 /hpf   Bacteria, UA None seen None seen/Few  Urinalysis, Complete  Result Value Ref  Range   Specific Gravity, UA 1.020 1.005 - 1.030   pH, UA 5.0 5.0 - 7.5   Color, UA Yellow Yellow   Appearance Ur Hazy (A) Clear   Leukocytes,UA Negative Negative   Protein,UA Negative Negative/Trace   Glucose, UA Negative Negative   Ketones, UA Negative Negative   RBC, UA 2+ (A) Negative   Bilirubin, UA Negative Negative   Urobilinogen, Ur 0.2 0.2 - 1.0 mg/dL   Nitrite, UA Negative Negative   Microscopic Examination See below:   Bladder Scan (Post Void Residual) in office  Result Value Ref Range   Scan Result 27mL    Assessment & Plan:   1. Dysuria Worse since recent cystoscopy, however UA today is benign.  Suspect inflammatory etiology rather than infectious. PVR WNL.  He is already on Flomax 0.8 mg daily and finasteride.  Treatment options are extremely limited given his history of recent urinary retention, CKD stage IV, and citalopram and trazodone use.  Anticholinergics and beta 3 agonists are contraindicated with his recent history of urinary retention, NSAIDs and Pyridium are contraindicated with his renal disease, and Uribel is contraindicated due to his increased risk for serotonin syndrome.  Will send his urine for culture and treat as indicated.  I'm deferring further empiric antibiotics given poor tolerance of doxycycline and concern for his CKD.  At this point, I believe the safest treatment option is bladder rescue solutions.  Patient and his daughter are in agreement with this plan. - Urinalysis, Complete - Bladder Scan (Post Void Residual) in office - CULTURE, URINE COMPREHENSIVE   Return for Bladder rescue installations 2-3x/week x3 weeks.  Debroah Loop, PA-C  [facility] [address] [place], [place] [address] [phone]

## 2020-04-16 NOTE — Progress Notes (Signed)
In and Out Catheterization  Patient is present today for a I & O catheterization due to dysuria. Patient was cleaned and prepped in a sterile fashion with betadine. A 14FR coude cath was inserted no complications were noted, 13ml of urine return was noted, urine was yellow in color. A clean urine sample was collected for UA and culture. Bladder was drained  And catheter was removed without difficulty.    Performed by: Debroah Loop, PA-C

## 2020-04-16 NOTE — Progress Notes (Signed)
Pulmonary Nodule Clinic Consult note [facility]  [phone] [phone]  Patient Care Team: Maryland Pink, MD as PCP - General (Family Medicine)   Name of the patient: Max Ellis  [id number]  [birth year]   Date of visit: 04/16/2020   Diagnosis- Lung Nodule  Chief complaint/ Reason for visit- Pulmonary Nodule Clinic Initial Visit  Past Medical History:  Received referral from Dr. Chrystine Oiler.   Patient had follow-up CT chest without contrast  for a lung nodule that was identified back in June 2020.  CT scan from 04/13/2019 revealed a stable asymmetric irregular density in right lung apex most consistent with scarring but continued follow-up is recommended to rule out neoplasm.  Stable 6 mm nodule is noted in right upper lobe as well.  Follow-up noncontrasted chest CT in 12 months is recommended to ensure stability.  He was evaluated in the emergency room on 01/11/2019 for chest pain and shortness of breath.  Subsequent imaging revealed mild architectural distortion in the right lung apex with associated mild bronchiectasis with small central cavitation.  Suspect that this may represent pulmonary scarring and fibrosis, however appears progressed.  Scattered pulmonary nodules including a 6 mm slightly spiculated nodule in the right upper lobe.  Attempt was made to retrieve comparison images from 2001 but was unsuccessful.  Short-term follow-up 3 to 6 months with a noncontrasted CT was recommended.  I personally reviewed all patient's previous imaging including most recent from 04/13/2019.  I recommend follow-up with noncontrasted CT scan of the chest in approximately 12 months from previous.  Interval history-Max Ellis presents today with his daughter to review most recent CT chest.  Patient is a current smoker.  Placement since the age of 86 years old.  He has smoked 1 to 2 packs of cigarettes daily for 86 years.    Tobacco Use: High Risk  .  Smoking Tobacco Use: Current Every Day Smoker  . Smokeless Tobacco Use: Former Systems developer   Mr. Pankow has been disabled for about 25 years.  He has fallen on several occasions and collects disability.  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  Did not wear protective face guards.   He lives at home with his wife.  His daughter helps out.  He is currently doing well.  He is blind.  Has no personal history of cancer.  Has family history of pancreatic cancer.  Both his mom and daughter has passed away from pancreatic cancer.  He is limited to activities at home due to eyesight.   ECOG FS:2 - Symptomatic, <50% confined to bed  Review of systems- Review of Systems  Constitutional: Positive for malaise/fatigue. Negative for chills, fever and weight loss.  HENT: Negative for congestion, ear pain and tinnitus.   Eyes: Negative.  Negative for blurred vision and double vision.       Blind  Respiratory: Negative.  Negative for cough, sputum production and shortness of breath.   Cardiovascular: Negative.  Negative for chest pain, palpitations and leg swelling.  Gastrointestinal: Negative.  Negative for abdominal pain, constipation, diarrhea, nausea and vomiting.  Genitourinary: Negative for dysuria, frequency and urgency.  Musculoskeletal: Negative for back pain and falls.  Skin: Negative.  Negative for rash.  Neurological: Positive for weakness. Negative for headaches.  Endo/Heme/Allergies: Negative.  Does not bruise/bleed easily.  Psychiatric/Behavioral: Negative.  Negative for depression. The patient is not nervous/anxious and does not have insomnia.      No Known Allergies  Past Medical History:  Diagnosis Date  . COPD (chronic obstructive pulmonary disease) ([place])   . Hypertension   . Peripheral vascular disease ([place])   . Renal disorder   . Stage 4 chronic kidney disease ([facility])      No past surgical history on file.  Social History   Socioeconomic History    . Marital status: Married    Spouse name: Not on file  . Number of children: Not on file  . Years of education: Not on file  . Highest education level: Not on file  Occupational History  . Not on file  Tobacco Use  . Smoking status: Current Every Day Smoker    Types: Cigarettes  . Smokeless tobacco: Former Systems developer    Types: Chew  Substance and Sexual Activity  . Alcohol use: No  . Drug use: No  . Sexual activity: Not on file  Other Topics Concern  . Not on file  Social History Narrative  . Not on file   Social Determinants of Health   Financial Resource Strain:   . Difficulty of Paying Living Expenses: Not on file  Food Insecurity:   . Worried About Charity fundraiser in the Last Year: Not on file  . Ran Out of Food in the Last Year: Not on file  Transportation Needs:   . Lack of Transportation (Medical): Not on file  . Lack of Transportation (Non-Medical): Not on file  Physical Activity:   . Days of Exercise per Week: Not on file  . Minutes of Exercise per Session: Not on file  Stress:   . Feeling of Stress : Not on file  Social Connections:   . Frequency of Communication with Friends and Family: Not on file  . Frequency of Social Gatherings with Friends and Family: Not on file  . Attends Religious Services: Not on file  . Active Member of Clubs or Organizations: Not on file  . Attends Archivist Meetings: Not on file  . Marital Status: Not on file  Intimate Partner Violence:   . Fear of Current or Ex-Partner: Not on file  . Emotionally Abused: Not on file  . Physically Abused: Not on file  . Sexually Abused: Not on file    No family history on file.   Current Outpatient Medications:  .  ASPIRIN 81 PO, Take by mouth., Disp: , Rfl:  .  aspirin EC 81 MG tablet, Take 81 mg by mouth daily. , Disp: , Rfl:  .  calcitRIOL (ROCALTROL) 0.25 MCG capsule, Take by mouth., Disp: , Rfl:  .  cholecalciferol (VITAMIN D3) 25 MCG (1000 UT) tablet, Take 1,000 Units by  mouth daily., Disp: , Rfl:  .  citalopram (CELEXA) 10 MG tablet, Take 10 mg by mouth daily., Disp: , Rfl:  .  famotidine (PEPCID) 20 MG tablet, Take by mouth., Disp: , Rfl:  .  finasteride (PROSCAR) 5 MG tablet, Take 5 mg by mouth daily., Disp: , Rfl: 11 .  furosemide (LASIX) 20 MG tablet, Take 20-40 mg by mouth daily., Disp: , Rfl:  .  latanoprost (XALATAN) 0.005 % ophthalmic solution, 1 drop at bedtime., Disp: , Rfl:  .  predniSONE (STERAPRED UNI-PAK 21 TAB) 10 MG (21) TBPK tablet, Dispense steroid taper pack as directed (Patient not taking: Reported on 04/16/2020), Disp: 21 tablet, Rfl: 0 .  tamsulosin (FLOMAX) 0.4 MG CAPS capsule, Take 0.8 mg by mouth daily., Disp: , Rfl:  .  traZODone (DESYREL) 50 MG tablet, Take 50  mg by mouth at bedtime., Disp: , Rfl:   Physical exam: There were no vitals filed for this visit. Physical Exam Constitutional:      Appearance: Normal appearance.  HENT:     Head: Normocephalic and atraumatic.  Eyes:     Pupils: Pupils are equal, round, and reactive to light.  Cardiovascular:     Rate and Rhythm: Normal rate and regular rhythm.     Heart sounds: Normal heart sounds. No murmur heard.   Pulmonary:     Effort: Pulmonary effort is normal.     Breath sounds: Normal breath sounds. No wheezing.  Abdominal:     General: Bowel sounds are normal. There is no distension.     Palpations: Abdomen is soft.     Tenderness: There is no abdominal tenderness.  Musculoskeletal:        General: Normal range of motion.     Cervical back: Normal range of motion.  Skin:    General: Skin is warm and dry.     Findings: No rash.  Neurological:     Mental Status: He is alert and oriented to person, place, and time.  Psychiatric:        Judgment: Judgment normal.      CMP Latest Ref Rng & Units 01/11/2019  Glucose 70 - 99 mg/dL 104(H)  BUN 8 - 23 mg/dL 26(H)  Creatinine 0.61 - 1.24 mg/dL 2.78(H)  Sodium 135 - 145 mmol/L 132(L)  Potassium 3.5 - 5.1 mmol/L 4.8    Chloride 98 - 111 mmol/L 98  CO2 22 - 32 mmol/L 25  Calcium 8.9 - 10.3 mg/dL 8.7(L)   CBC Latest Ref Rng & Units 01/11/2019  WBC 4.0 - 10.5 K/uL 6.3  Hemoglobin 13.0 - 17.0 g/dL 10.9(L)  Hematocrit 39 - 52 % 32.6(L)  Platelets 150 - 400 K/uL 167    No images are attached to the encounter.  CT Chest Wo Contrast  Result Date: 04/15/2020 CLINICAL DATA:  85 year old male with history of intermittent lower chest pain for the past 2-3 weeks. Current smoker. EXAM: CT CHEST WITHOUT CONTRAST TECHNIQUE: Multidetector CT imaging of the chest was performed following the standard protocol without IV contrast. COMPARISON:  Chest CT 04/13/2019. FINDINGS: Cardiovascular: Heart size is normal. There is no significant pericardial fluid, thickening or pericardial calcification. There is aortic atherosclerosis, as well as atherosclerosis of the great vessels of the mediastinum and the coronary arteries, including calcified atherosclerotic plaque in the left main, left anterior descending, left circumflex and right coronary arteries. Mediastinum/Nodes: No pathologically enlarged mediastinal or hilar lymph nodes. Please note that accurate exclusion of hilar adenopathy is limited on noncontrast CT scans. Esophagus is unremarkable in appearance. No axillary lymphadenopathy. Lungs/Pleura: Multiple tiny pulmonary nodules are again noted throughout the lungs bilaterally, stable in size and number to the prior examination, largest of which is in the inferior aspect of the right upper lobe abutting the minor fissure (axial image 89 of series 3) measuring 5 mm. In addition, there continues to be a nodular area of architectural distortion in the right upper lobe near the apex (axial image 28 of series 3) measuring 3.1 x 3.0 cm on today's examination (2.9 x 2.8 cm on the prior study when measured in a similar fashion). There are associated areas of cylindrical bronchiectasis extending into and around this region. No acute  consolidative airspace disease. No pleural effusions. Upper Abdomen: Aortic atherosclerosis. Subcentimeter low-attenuation lesion in segment 4A of the liver, incompletely characterized on today's non-contrast CT  examination, but similar to prior studies and statistically likely to represent a tiny cyst. Musculoskeletal: There are no aggressive appearing lytic or blastic lesions noted in the visualized portions of the skeleton. IMPRESSION: 1. Large nodular area of architectural distortion in the right upper lobe, similar to the prior examination, with an appearance that is strongly favors an area of chronic post infectious or inflammatory scarring. Repeat noncontrast chest CT is recommended in 12 months to ensure continued stability. 2. Smaller pulmonary nodules scattered throughout the lungs bilaterally measuring 5 mm or less in size are stable compared to the prior study, considered benign. 3. Aortic atherosclerosis, in addition to left main and 3 vessel coronary artery disease. Aortic Atherosclerosis (ICD10-I70.0). Electronically Signed   By: Vinnie Langton M.D.   On: 04/15/2020 10:20   Ultrasound renal complete  Result Date: 03/22/2020 CLINICAL DATA:  Dysuria, suprapubic pain EXAM: RENAL / URINARY TRACT ULTRASOUND COMPLETE COMPARISON:  CT abdomen and pelvis 06/22/2016 FINDINGS: Right Kidney: Renal measurements: 7.7 x 3.5 x 4.1 cm = volume: 57 mL. Marked cortical thinning. Increased cortical echogenicity. No mass, hydronephrosis, or shadowing calcification. Left Kidney: Renal measurements: 8.7 x 4.6 x 4.6 cm = volume: 96 mL. Inadequately visualized due to body habitus and bowel. Cortical thinning. Increased cortical echogenicity. No definite mass or hydronephrosis on limited assessment. Bladder: Diffuse bladder wall thickening.  No focal mass. Other: N/A IMPRESSION: BILATERAL renal cortical atrophy and medical renal disease changes. No evidence of renal mass or hydronephrosis. Diffuse bladder wall  thickening, nonspecific, but can be seen with chronic bladder outlet obstruction or cystitis, tumor considered less likely due to diffuse nature though not entirely excluded; correlation with urinalysis recommended. Electronically Signed   By: Lavonia Dana M.D.   On: 03/22/2020 18:30     Assessment and plan- Patient is a 86 y.o. male who presents to pulmonary nodule clinic for follow-up of incidental lung nodules.    CT chest without contrast   from 04/16/2019 shows large nodular area of architectural distortion in the right upper lobe similar to prior exam with an appearance that strongly favors an area of chronic postinfectious or inflammatory scarring.  Repeat noncontrast chest CT is recommended in 12 months to ensure continued stability.  Smaller pulmonary nodules scattered throughout the lungs bilaterally measuring 5 mm or less in size are stable compared to prior.  Calculating malignancy probability of a pulmonary nodule: Risk factors include: 1.  Age. 2.  Cancer history. 3.  Diameter of pulmonary nodule and mm 4.  Location 5.  Smoking history 6.  Spiculation present   Based on risk factors, this patient is HIGH risk for the development of lung cancer.  I would recommend a 33-month follow-up with imaging to ensure stability given smoking history.     During our visit, we discussed pulmonary nodules are a common incidental finding and are often how lung cancer is discovered.  Lung cancer survival is directly related to the stage at diagnosis.  We discussed that nodules can vary in presentation from solitary pulmonary nodules to masses, 2 groundglass opacities and multiple nodules.  Pulmonary nodules in the majority of cases are benign but the probability of these becoming malignant cannot be undermined.  Early identification of malignant nodules could lead to early diagnosis and increased survival.   We discussed the probability of pulmonary nodules becoming malignant increase with age, pack  years of tobacco use, size/characteristics of the nodule and location; with upper lobe involvement being most worrisome.   We discussed the  goal of our clinic is to thoroughly evaluate each nodule, developed a comprehensive, individualized plan of care utilizing the most advanced technology and significantly reduce the time from detection to treatment.  A dedicated pulmonary nodule clinic has proven to indeed expedite the detection and treatment of lung cancer.   Patient education in fact sheet provided along with most recent CT scans.  Plan- Reviewed recent CT chest. Discussed family history. Discussed occupational history. Repeat CT chest in approximately 1 year.-Orders placed  Disposition- CT chest without contrast in 1 year We will touch base with patient a few days later to discuss results.   Visit Diagnosis 1. Lung nodule     Patient expressed understanding and was in agreement with this plan. He also understands that He can call clinic at any time with any questions, concerns, or complaints.   Greater than 50% was spent in counseling and coordination of care with this patient including but not limited to discussion of the relevant topics above (See A&P) including, but not limited to diagnosis and management of acute and chronic medical conditions.   Thank you for allowing me to participate in the care of this very pleasant patient.    Jacquelin Hawking, NP [place] [facility] Cell - [phone] Pager- [phone] 04/17/2020 4:00 PM

## 2020-04-18 ENCOUNTER — Ambulatory Visit (INDEPENDENT_AMBULATORY_CARE_PROVIDER_SITE_OTHER): Payer: Medicare Other | Admitting: Physician Assistant

## 2020-04-18 ENCOUNTER — Other Ambulatory Visit: Payer: Self-pay

## 2020-04-18 DIAGNOSIS — R300 Dysuria: Secondary | ICD-10-CM | POA: Diagnosis not present

## 2020-04-18 MED ORDER — SODIUM BICARBONATE 8.4 % IV SOLN
11.0000 mL | Freq: Once | INTRAVENOUS | Status: AC
  Administered 2020-04-18: 11 mL

## 2020-04-18 NOTE — Progress Notes (Signed)
Bladder Rescue Solution Instillation  Due to dysuria patient is present today for a Rescue Solution Treatment.  Patient was cleaned and prepped in a sterile fashion with betadine and lidocaine 2% jelly was instilled into the urethra.  A 14 FR coude catheter was inserted, urine return was noted 5ml, urine was yellow in color.  Instilled a solution consisting of 67ml of Sodium Bicarb, 2 ml Lidocaine and 1 ml of Heparin. The catheter was then removed. Patient tolerated well, no complications were noted.   Performed by: Debroah Loop, PA-C   Follow up/ Additional Notes: 5 days for bladder rescue #2

## 2020-04-19 LAB — MICROSCOPIC EXAMINATION: Bacteria, UA: NONE SEEN

## 2020-04-19 LAB — URINALYSIS, COMPLETE
Bilirubin, UA: NEGATIVE
Glucose, UA: NEGATIVE
Ketones, UA: NEGATIVE
Leukocytes,UA: NEGATIVE
Nitrite, UA: NEGATIVE
Protein,UA: NEGATIVE
RBC, UA: NEGATIVE
Specific Gravity, UA: 1.015 (ref 1.005–1.030)
Urobilinogen, Ur: 0.2 mg/dL (ref 0.2–1.0)
pH, UA: 5 (ref 5.0–7.5)

## 2020-04-20 LAB — CULTURE, URINE COMPREHENSIVE

## 2020-04-23 ENCOUNTER — Other Ambulatory Visit: Payer: Self-pay

## 2020-04-23 ENCOUNTER — Ambulatory Visit (INDEPENDENT_AMBULATORY_CARE_PROVIDER_SITE_OTHER): Payer: Medicare Other | Admitting: Physician Assistant

## 2020-04-23 DIAGNOSIS — R300 Dysuria: Secondary | ICD-10-CM

## 2020-04-23 LAB — URINALYSIS, COMPLETE
Bilirubin, UA: NEGATIVE
Glucose, UA: NEGATIVE
Ketones, UA: NEGATIVE
Nitrite, UA: NEGATIVE
Protein,UA: NEGATIVE
Specific Gravity, UA: 1.015 (ref 1.005–1.030)
Urobilinogen, Ur: 0.2 mg/dL (ref 0.2–1.0)
pH, UA: 5 (ref 5.0–7.5)

## 2020-04-23 LAB — MICROSCOPIC EXAMINATION: Bacteria, UA: NONE SEEN

## 2020-04-23 MED ORDER — SODIUM BICARBONATE 8.4 % IV SOLN
11.0000 mL | Freq: Once | INTRAVENOUS | Status: AC
  Administered 2020-04-23: 11 mL

## 2020-04-23 NOTE — Progress Notes (Signed)
Simple Catheter Placement  Due to inability to provide a urine sample patient is present today for a foley cath placement.  Patient was cleaned and prepped in a sterile fashion with betadine and 2% lidocaine jelly was instilled into the urethra. A 16 FR coude foley catheter was inserted, urine return was noted  33ml, urine was yellow in color.  The balloon was filled with 10cc of sterile water.  A urine sample was obtained for UA and microscopy and the catheter was plugged. Patient tolerated well, no complications were noted   Performed by: Debroah Loop, PA-C   Bladder Rescue Solution Instillation  Due to dysuria patient is present today for a Rescue Solution Treatment.  Instilled a solution consisting of 24ml of Sodium Bicarb, 2 ml Lidocaine and 1 ml of Heparin through the 16Fr coude catheter in place. The balloon was drained of 10cc of water and the catheter was then removed. Patient tolerated well, no complications were noted.   Performed by: Debroah Loop, PA-C  Follow up/ Additional Notes: 2 days for bladder rescue #3

## 2020-04-25 ENCOUNTER — Ambulatory Visit (INDEPENDENT_AMBULATORY_CARE_PROVIDER_SITE_OTHER): Payer: Medicare Other | Admitting: Physician Assistant

## 2020-04-25 ENCOUNTER — Other Ambulatory Visit: Payer: Self-pay

## 2020-04-25 DIAGNOSIS — R300 Dysuria: Secondary | ICD-10-CM

## 2020-04-25 MED ORDER — SODIUM BICARBONATE 8.4 % IV SOLN
11.0000 mL | Freq: Once | INTRAVENOUS | Status: AC
  Administered 2020-04-25: 11 mL

## 2020-04-25 MED ORDER — SODIUM BICARBONATE 8.4 % IV SOLN: 11.0000 mL | Freq: Once | INTRAVENOUS | Status: DC

## 2020-04-25 NOTE — Progress Notes (Signed)
Bladder Rescue Solution Instillation  Due to dysuria patient is present today for a Rescue Solution Treatment.  Patient was cleaned and prepped in a sterile fashion with betadine and lidocaine 2% jelly was instilled into the urethra.  A 14 FR coude catheter was inserted, urine return was noted 3ml, urine was yellow in color.  Instilled a solution consisting of 75ml of Sodium Bicarb, 2 ml Lidocaine and 1 ml of Heparin. The catheter was then removed. Patient tolerated well, no complications were noted.   Performed by: Debroah Loop, PA-C   Follow up/ Additional Notes: 5 days for bladder rescue #4

## 2020-04-26 LAB — MICROSCOPIC EXAMINATION

## 2020-04-26 LAB — URINALYSIS, COMPLETE
Bilirubin, UA: NEGATIVE
Glucose, UA: NEGATIVE
Ketones, UA: NEGATIVE
Nitrite, UA: NEGATIVE
Protein,UA: NEGATIVE
RBC, UA: NEGATIVE
Specific Gravity, UA: 1.015 (ref 1.005–1.030)
Urobilinogen, Ur: 0.2 mg/dL (ref 0.2–1.0)
pH, UA: 5 (ref 5.0–7.5)

## 2020-04-30 ENCOUNTER — Ambulatory Visit: Payer: Self-pay | Admitting: Physician Assistant

## 2020-04-30 ENCOUNTER — Other Ambulatory Visit: Payer: Self-pay

## 2020-04-30 ENCOUNTER — Ambulatory Visit (INDEPENDENT_AMBULATORY_CARE_PROVIDER_SITE_OTHER): Payer: Medicare Other | Admitting: Physician Assistant

## 2020-04-30 DIAGNOSIS — R300 Dysuria: Secondary | ICD-10-CM

## 2020-04-30 MED ORDER — SODIUM BICARBONATE 8.4 % IV SOLN
11.0000 mL | Freq: Once | INTRAVENOUS | Status: AC
  Administered 2020-04-30: 11 mL

## 2020-04-30 NOTE — Progress Notes (Signed)
Bladder Rescue Solution Instillation  Due to dysuria patient is present today for a Rescue Solution Treatment.  Patient was cleaned and prepped in a sterile fashion with betadine and lidocaine 2% jelly was instilled into the urethra.  A 14 FR coude catheter was inserted, urine return was noted 79ml, urine was yellow in color.  Instilled a solution consisting of 81ml of Sodium Bicarb, 2 ml Lidocaine and 1 ml of Heparin. The catheter was then removed. Patient tolerated well, no complications were noted.   Performed by: Debroah Loop, PA-C and Bradly Bienenstock, CMA  Follow up/ Additional Notes: Increased pyuria today on UA, patient denies infective symptoms. Will send for culture today for further evaluation. RTC 2 days for bladder rescue #5

## 2020-05-01 LAB — URINALYSIS, COMPLETE
Bilirubin, UA: NEGATIVE
Glucose, UA: NEGATIVE
Ketones, UA: NEGATIVE
Nitrite, UA: NEGATIVE
Specific Gravity, UA: 1.02 (ref 1.005–1.030)
Urobilinogen, Ur: 0.2 mg/dL (ref 0.2–1.0)
pH, UA: 5 (ref 5.0–7.5)

## 2020-05-01 LAB — MICROSCOPIC EXAMINATION
Bacteria, UA: NONE SEEN
WBC, UA: >30 /hpf — AB (ref 0–5)

## 2020-05-02 ENCOUNTER — Other Ambulatory Visit: Payer: Self-pay

## 2020-05-02 ENCOUNTER — Ambulatory Visit (INDEPENDENT_AMBULATORY_CARE_PROVIDER_SITE_OTHER): Payer: Medicare Other | Admitting: Physician Assistant

## 2020-05-02 DIAGNOSIS — R300 Dysuria: Secondary | ICD-10-CM

## 2020-05-02 MED ORDER — SULFAMETHOXAZOLE-TRIMETHOPRIM 400-80 MG PO TABS: ORAL_TABLET | ORAL | 0 refills | Status: DC

## 2020-05-02 MED ORDER — SODIUM BICARBONATE 8.4 % IV SOLN
11.0000 mL | Freq: Once | INTRAVENOUS | Status: AC
  Administered 2020-05-02: 11 mL

## 2020-05-02 NOTE — Patient Instructions (Signed)
I want you to start antibiotics today based on your urine culture from two days ago. You will take the medication twice every day for 7 days. For your first dose, take TWO tablets. For every dose after the first dose, take ONE tablet.

## 2020-05-02 NOTE — Progress Notes (Signed)
Bladder Rescue Solution Instillation  Due to dysuria patient is present today for a Rescue Solution Treatment.  Patient was cleaned and prepped in a sterile fashion with betadine and lidocaine 2% jelly was instilled into the urethra.  A 14 FR coude catheter was inserted, urine return was noted 98ml, urine was yellow in color.  Instilled a solution consisting of 48ml of Sodium Bicarb, 2 ml Lidocaine and 1 ml of Heparin. The catheter was then removed. Patient tolerated well, no complications were noted.   Performed by: Debroah Loop, PA-C   Follow up/ Additional Notes: Urine culture preliminary with 10-25k CFUs/mL unnamed bacteria. Patient reports some increased dysuria. Will start empiric Bactrim, dose adjusted for renal insufficiency.

## 2020-05-03 LAB — MICROSCOPIC EXAMINATION

## 2020-05-03 LAB — URINALYSIS, COMPLETE
Bilirubin, UA: NEGATIVE
Glucose, UA: NEGATIVE
Ketones, UA: NEGATIVE
Nitrite, UA: NEGATIVE
Protein,UA: NEGATIVE
RBC, UA: NEGATIVE
Specific Gravity, UA: 1.015 (ref 1.005–1.030)
Urobilinogen, Ur: 0.2 mg/dL (ref 0.2–1.0)
pH, UA: 5 (ref 5.0–7.5)

## 2020-05-05 LAB — CULTURE, URINE COMPREHENSIVE

## 2020-05-07 ENCOUNTER — Ambulatory Visit (INDEPENDENT_AMBULATORY_CARE_PROVIDER_SITE_OTHER): Payer: Medicare Other | Admitting: Physician Assistant

## 2020-05-07 ENCOUNTER — Other Ambulatory Visit: Payer: Self-pay

## 2020-05-07 ENCOUNTER — Encounter: Payer: Self-pay | Admitting: Physician Assistant

## 2020-05-07 VITALS — Ht 72.0 in | Wt 143.0 lb

## 2020-05-07 DIAGNOSIS — R300 Dysuria: Secondary | ICD-10-CM | POA: Diagnosis not present

## 2020-05-07 LAB — URINALYSIS, COMPLETE
Bilirubin, UA: NEGATIVE
Glucose, UA: NEGATIVE
Ketones, UA: NEGATIVE
Nitrite, UA: NEGATIVE
Protein,UA: NEGATIVE
RBC, UA: NEGATIVE
Specific Gravity, UA: 1.015 (ref 1.005–1.030)
Urobilinogen, Ur: 0.2 mg/dL (ref 0.2–1.0)
pH, UA: 5 (ref 5.0–7.5)

## 2020-05-07 LAB — MICROSCOPIC EXAMINATION: Bacteria, UA: NONE SEEN

## 2020-05-07 MED ORDER — SODIUM BICARBONATE 8.4 % IV SOLN
11.0000 mL | Freq: Once | INTRAVENOUS | Status: AC
  Administered 2020-05-07: 11 mL

## 2020-05-07 MED ORDER — AMOXICILLIN-POT CLAVULANATE 500-125 MG PO TABS
1.0000 | ORAL_TABLET | Freq: Two times a day (BID) | ORAL | 0 refills | Status: AC
Start: 2020-05-07 — End: 2020-05-14

## 2020-05-07 NOTE — Progress Notes (Signed)
Bladder Rescue Solution Instillation  Due to dysuria patient is present today for a Rescue Solution Treatment.  Patient was cleaned and prepped in a sterile fashion with betadine.  A lubricated 14 FR coude catheter was inserted, urine return was noted 69ml, urine was yellow in color.  Instilled a solution consisting of 50ml of Sodium Bicarb, 2 ml Lidocaine and 1 ml of Heparin. The catheter was then removed. Patient tolerated well, no complications were noted.   Performed by: Debroah Loop, PA-C and Gaspar Cola, CMA  Follow up/ Additional Notes: Persistent pyuria on UA today; recent UCx resulted with E faecalis and Bactrim not tested. Will switch to Augmentin, dose adjusted for renal insufficiency.

## 2020-12-30 ENCOUNTER — Other Ambulatory Visit: Payer: Self-pay

## 2020-12-30 ENCOUNTER — Emergency Department
Admission: EM | Admit: 2020-12-30 | Discharge: 2020-12-30 | Disposition: A | Discharge status: Home / Self Care | Payer: Medicare Other | Attending: Emergency Medicine | Admitting: Emergency Medicine

## 2020-12-30 DIAGNOSIS — F17210 Nicotine dependence, cigarettes, uncomplicated: Secondary | ICD-10-CM | POA: Diagnosis not present

## 2020-12-30 DIAGNOSIS — N184 Chronic kidney disease, stage 4 (severe): Secondary | ICD-10-CM | POA: Diagnosis not present

## 2020-12-30 DIAGNOSIS — N390 Urinary tract infection, site not specified: Secondary | ICD-10-CM | POA: Insufficient documentation

## 2020-12-30 DIAGNOSIS — I129 Hypertensive chronic kidney disease with stage 1 through stage 4 chronic kidney disease, or unspecified chronic kidney disease: Secondary | ICD-10-CM | POA: Diagnosis not present

## 2020-12-30 DIAGNOSIS — J449 Chronic obstructive pulmonary disease, unspecified: Secondary | ICD-10-CM | POA: Insufficient documentation

## 2020-12-30 DIAGNOSIS — R300 Dysuria: Secondary | ICD-10-CM | POA: Diagnosis present

## 2020-12-30 DIAGNOSIS — Z7982 Long term (current) use of aspirin: Secondary | ICD-10-CM | POA: Insufficient documentation

## 2020-12-30 LAB — URINALYSIS, COMPLETE (UACMP) WITH MICROSCOPIC
Bacteria, UA: NONE SEEN
Bilirubin Urine: NEGATIVE
Glucose, UA: NEGATIVE mg/dL
Hgb urine dipstick: NEGATIVE
Ketones, ur: NEGATIVE mg/dL
Nitrite: NEGATIVE
Protein, ur: NEGATIVE mg/dL
Specific Gravity, Urine: 1.017 (ref 1.005–1.030)
pH: 5 (ref 5.0–8.0)

## 2020-12-30 NOTE — Discharge Instructions (Addendum)
Your exam and urinalysis is negative at this time for any signs of infection.  Continue to take your home medications as prescribed.  Drink plenty water cranberry juice to help stay hydrated.  Follow-up with urology for ongoing symptoms.  Return to the ED if needed.

## 2020-12-30 NOTE — ED Triage Notes (Signed)
Pt come with c/o possible UTI. Pt states he recently had bladder infection and was prescribed meds. Pt states it cleared it up.   Pt states prostate issues in [past  Pt states pain and burning with urination.

## 2020-12-30 NOTE — ED Provider Notes (Signed)
[facility] Emergency Department Provider Note  ____________________________________________   Event Date/Time   First MD Initiated Contact with Patient 12/30/20 1508     (approximate)  I have reviewed the triage vital signs and the nursing notes.   HISTORY  Chief Complaint UTI  HPI Max Ellis is a 87 y.o. male with the below medical history, presents himself to the ED for evaluation of possible UTI.  Patient recently had a bladder infection, and was prescribed antibiotics which he reportedly completed.  He did report improvement of his symptoms after completing antibiotic course.  He does give a history of BPH as well as being managed by urology in the past.  He complains of pain and burning with urination.  He denies any fever, chills, sweats, chest pain, shortness of breath.   Past Medical History:  Diagnosis Date  . COPD (chronic obstructive pulmonary disease) ([place])   . Hypertension   . Peripheral vascular disease ([place])   . Renal disorder   . Stage 4 chronic kidney disease [facility])     Patient Active Problem List   Diagnosis Date Noted  . Benign prostatic hyperplasia with incomplete bladder emptying 03/03/2020  . Essential hypertension 01/26/2017  . Tobacco use disorder 01/26/2017  . Pain in limb 01/26/2017    History reviewed. No pertinent surgical history.  Prior to Admission medications   Medication Sig Start Date End Date Taking? Authorizing Provider  ASPIRIN 81 PO Take by mouth.    [provider]  aspirin EC 81 MG tablet Take 81 mg by mouth daily.     [provider]  calcitRIOL (ROCALTROL) 0.25 MCG capsule Take by mouth. 09/18/19   [provider]  cholecalciferol (VITAMIN D3) 25 MCG (1000 UT) tablet Take 1,000 Units by mouth daily.    [provider]  citalopram (CELEXA) 10 MG tablet Take 10 mg by mouth daily.    [provider]  famotidine (PEPCID) 20 MG tablet Take by mouth. 02/26/20  02/25/21  [provider]  finasteride (PROSCAR) 5 MG tablet Take 5 mg by mouth daily. 12/30/16   [provider]  furosemide (LASIX) 20 MG tablet Take 20-40 mg by mouth daily.    [provider]  latanoprost (XALATAN) 0.005 % ophthalmic solution 1 drop at bedtime. 02/15/20   [provider]  predniSONE (STERAPRED UNI-PAK 21 TAB) 10 MG (21) TBPK tablet Dispense steroid taper pack as directed 01/11/19   Earleen Newport, MD  tamsulosin (FLOMAX) 0.4 MG CAPS capsule Take 0.8 mg by mouth daily.    [provider]  traZODone (DESYREL) 50 MG tablet Take 50 mg by mouth at bedtime.    [provider]    Allergies Patient has no known allergies.  No family history on file.  Social History Social History   Tobacco Use  . Smoking status: Current Every Day Smoker    Types: Cigarettes  . Smokeless tobacco: Former Systems developer    Types: Chew  Substance Use Topics  . Alcohol use: No  . Drug use: No    Review of Systems  Constitutional: No fever/chills Eyes: No visual changes. ENT: No sore throat. Cardiovascular: Denies chest pain. Respiratory: Denies shortness of breath. Gastrointestinal: No abdominal pain.  No nausea, no vomiting.  No diarrhea.  No constipation. Genitourinary: Positive for dysuria.  Denies urinary retention or hematuria Musculoskeletal: Negative for back pain. Skin: Negative for rash. Neurological: Negative for headaches, focal weakness or numbness. ____________________________________________   PHYSICAL EXAM:  VITAL SIGNS: ED Triage Vitals  Enc Vitals Group     BP 12/30/20 1339 (!) 152/63     Pulse Rate 12/30/20 1339 73     Resp 12/30/20 1339 18     Temp 12/30/20 1339 97.8 F (36.6 C)     Temp Source 12/30/20 1339 Oral     SpO2 12/30/20 1339 100 %     Weight --      Height --      Head Circumference --      Peak Flow --      Pain Score 12/30/20 1339 3     Pain Loc --      Pain Edu? --      Excl. in [place]? --      Constitutional: Alert and oriented. Well appearing and in no acute distress. Eyes: Conjunctivae are normal. PERRL. EOMI. Head: Atraumatic. Nose: No congestion/rhinnorhea. Mouth/Throat: Mucous membranes are moist.  Oropharynx non-erythematous. Neck: No stridor.   Cardiovascular: Normal rate, regular rhythm. Grossly normal heart sounds.  Good peripheral circulation. Respiratory: Normal respiratory effort.  No retractions. Lungs CTAB. Gastrointestinal: Soft and nontender. No distention. No abdominal bruits. No CVA tenderness. Musculoskeletal: No lower extremity tenderness nor edema.  No joint effusions. Neurologic:  Normal speech and language. No gross focal neurologic deficits are appreciated. No gait instability. Skin:  Skin is warm, dry and intact. No rash noted. Psychiatric: Mood and affect are normal. Speech and behavior are normal.  ____________________________________________   LABS (all labs ordered are listed, but only abnormal results are displayed)  Labs Reviewed  URINALYSIS, COMPLETE (UACMP) WITH MICROSCOPIC - Abnormal; Notable for the following components:      Result Value   Color, Urine YELLOW (*)    APPearance CLEAR (*)    Leukocytes,Ua TRACE (*)    All other components within normal limits   ____________________________________________  EKG  ____________________________________________  RADIOLOGY I, Melvenia Needles, personally viewed and evaluated these images (plain radiographs) as part of my medical decision making, as well as reviewing the written report by the radiologist.  ED MD interpretation:    Official radiology report(s): No results found.  ____________________________________________   PROCEDURES  Procedure(s) performed (including Critical Care):  Procedures   ____________________________________________   INITIAL IMPRESSION / ASSESSMENT AND PLAN / ED COURSE  As part of my medical decision making, I reviewed the following  data within the [place] reviewed WNL, Old chart reviewed and Notes from prior ED visits   Geriatric patient with a history of BPH, presents to the ED with concerns for dysuria and possible UTI.  Chart review reveals that the patient has been treated in the past for dysuria with rescue solution treatments at the urologist office.  He was evaluated for his plaints with a UA which did not reveal any acute bacteria.  Patient is overall reassured at this time, and is inclined to follow-up with his primary urologist. ____________________________________________   FINAL CLINICAL IMPRESSION(S) / ED DIAGNOSES  Final diagnoses:  Dysuria     ED Discharge Orders    None       Note:  This document was prepared using Dragon voice recognition software and may include unintentional dictation errors.    Melvenia Needles, PA-C 12/31/20 0016    Lucrezia Starch, MD 12/31/20 [phone]

## 2021-01-02 ENCOUNTER — Other Ambulatory Visit: Payer: Self-pay

## 2021-01-02 ENCOUNTER — Ambulatory Visit (INDEPENDENT_AMBULATORY_CARE_PROVIDER_SITE_OTHER): Payer: Medicare Other | Admitting: Physician Assistant

## 2021-01-02 VITALS — BP 168/67 | HR 65 | Ht 74.0 in | Wt 150.0 lb

## 2021-01-02 DIAGNOSIS — R300 Dysuria: Secondary | ICD-10-CM | POA: Diagnosis not present

## 2021-01-02 LAB — BLADDER SCAN AMB NON-IMAGING: Scan Result: 169

## 2021-01-02 NOTE — Progress Notes (Signed)
01/02/2021 1:56 PM   Max Ellis [birth year] [id number]  CC: Chief Complaint  Patient presents with  . Dysuria    HPI: Max Ellis is a 87 y.o. male with PMH BPH on Flomax 0.8 mg and finasteride, CKD 4, and dysuria with benign cystoscopy in September 2021 who responded well to bladder rescue who presents today for evaluation of dysuria.   Today he reports a 2 to 3-week history of dysuria and low belly pain.  He denies fever, chills, nausea, and vomiting.  He was seen in the emergency department 3 days ago for possible UTI, at which point he reported a recent course of antibiotics prescribed by his PCP, however per chart review he has not seen Dr. Kary Kos since February.  Patient denies recent antibiotic use today.  He continues to take Flomax and finasteride.  In-office UA today positive for trace intact blood, 1+ protein, and 2+ leukocyte esterase; urine microscopy with >30 WBCs/HPF. PVR 197m.  PMH: Past Medical History:  Diagnosis Date  . [place]   . Hypertension   . [place]ipheral vascular disease [place]   . Renal disorder   . Stage 4 chronic kidney disease ([facility]     Surgical History: No past surgical history on file.  Home Medications:  Allergies as of 01/02/2021   No Known Allergies     Medication List       Accurate as of January 02, 2021  1:56 PM. If you have any questions, ask your nurse or doctor.        ASPIRIN 81 PO Take by mouth. What changed: Another medication with the same name was removed. Continue taking this medication, and follow the directions you see here. Changed by: SDebroah Loop PA-C   calcitRIOL 0.25 MCG capsule Commonly known as: ROCALTROL Take by mouth.   cholecalciferol 25 MCG (1000 UNIT) tablet Commonly known as: VITAMIN D3 Take 1,000 Units by mouth daily.   citalopram 10 MG tablet Commonly known as: CELEXA Take 10 mg by mouth daily.   famotidine 20 MG tablet Commonly known as:  PEPCID Take by mouth.   finasteride 5 MG tablet Commonly known as: PROSCAR Take 5 mg by mouth daily.   furosemide 40 MG tablet Commonly known as: LASIX Take 40 mg by mouth daily. What changed: Another medication with the same name was removed. Continue taking this medication, and follow the directions you see here. Changed by: SDebroah Loop PA-C   latanoprost 0.005 % ophthalmic solution Commonly known as: XALATAN 1 drop at bedtime.   predniSONE 10 MG (21) Tbpk tablet Commonly known as: STERAPRED UNI-PAK 21 TAB Dispense steroid taper pack as directed   tamsulosin 0.4 MG Caps capsule Commonly known as: FLOMAX Take 0.8 mg by mouth daily.   traZODone 50 MG tablet Commonly known as: DESYREL Take 50 mg by mouth at bedtime.       Allergies:  No Known Allergies  Family History: No family history on file.  Social History:   reports that he has been smoking cigarettes. He has quit using smokeless tobacco.  His smokeless tobacco use included chew. He reports that he does not drink alcohol and does not use drugs.  Physical Exam: BP (!) 168/67   Pulse 65   Ht '6\' 2"'$  (1.88 m)   Wt 150 lb (68 kg)   BMI 19.26 kg/m   Constitutional:  Alert and oriented, no acute distress, nontoxic appearing HEENT: [place], AT Cardiovascular: No clubbing, cyanosis, or edema  Respiratory: Normal respiratory effort, no increased work of breathing Skin: No rashes, bruises or suspicious lesions Neurologic: Grossly intact, no focal deficits, moving all 4 extremities Psychiatric: Normal mood and affect  Laboratory Data: Results for orders placed or performed in visit on 01/02/21  Mycoplasma / ureaplasma culture   Specimen: Urine; Genital   UR  Result Value Ref Range   Ureaplasma urealyticum Comment Negative   Mycoplasma hominis Culture Comment Negative  Microscopic Examination   Urine  Result Value Ref Range   WBC, UA >30 (A) 0 - 5 /hpf   RBC 0-2 0 - 2 /hpf   Epithelial Cells (non  renal) 0-10 0 - 10 /hpf   Casts Present (A) None seen /lpf   Cast Type Hyaline casts N/A   Bacteria, UA None seen None seen/Few  Urinalysis, Complete  Result Value Ref Range   Specific Gravity, UA 1.020 1.005 - 1.030   pH, UA 5.0 5.0 - 7.5   Color, UA Yellow Yellow   Appearance Ur Clear Clear   Leukocytes,UA 2+ (A) Negative   Protein,UA 1+ (A) Negative/Trace   Glucose, UA Negative Negative   Ketones, UA Negative Negative   RBC, UA Trace (A) Negative   Bilirubin, UA Negative Negative   Urobilinogen, Ur 0.2 0.2 - 1.0 mg/dL   Nitrite, UA Negative Negative   Microscopic Examination See below:   BLADDER SCAN AMB NON-IMAGING  Result Value Ref Range   Scan Result 169 ml    Assessment & Plan:   1. Dysuria UA today notable for pyuria.  With his underlying CKD, I would like to defer empiric antibiotics and send his urine for culture first before treating.  Patient is in agreement with this plan.  May consider a 74-monthcourse of suppressive antibiotics versus repeat bladder rescue pending culture results. - Urinalysis, Complete - BLADDER SCAN AMB NON-IMAGING - Mycoplasma / ureaplasma culture - CULTURE, URINE COMPREHENSIVE   Return for Will call with results.  SDebroah Loop PA-C  [facility] [address] [place] [phone]

## 2021-01-02 NOTE — Patient Instructions (Signed)
I'll send your urine for culture today and call you early next week if we need to start antibiotics for an infection.

## 2021-01-03 LAB — URINALYSIS, COMPLETE
Bilirubin, UA: NEGATIVE
Glucose, UA: NEGATIVE
Ketones, UA: NEGATIVE
Nitrite, UA: NEGATIVE
Specific Gravity, UA: 1.02 (ref 1.005–1.030)
Urobilinogen, Ur: 0.2 mg/dL (ref 0.2–1.0)
pH, UA: 5 (ref 5.0–7.5)

## 2021-01-03 LAB — MICROSCOPIC EXAMINATION
Bacteria, UA: NONE SEEN
WBC, UA: >30 /hpf — AB (ref 0–5)

## 2021-01-06 ENCOUNTER — Telehealth: Payer: Self-pay | Admitting: Physician Assistant

## 2021-01-06 LAB — CULTURE, URINE COMPREHENSIVE

## 2021-01-06 NOTE — Telephone Encounter (Signed)
Patient's daughter came into the office to check the results of patient's urine culture. I advised them that the culture was negative.   Daughter states that patient is still complaining of not being able empty his bladder.   Any advice?  Patient's daughter can be reached at [phone].

## 2021-01-07 NOTE — Telephone Encounter (Signed)
Please contact the daughter and explain that while his classic urine culture has come back positive, I also sent his urine for testing for less common urinary pathogens the do not grow on a standard urine culture.  This culture has not yet resulted.  He had an elevated residual when I saw him last week, however he was not in overt retention.  Is she saying that his emptying has worsened?  Is he having abdominal distention or lower abdominal pain?  If so, need to bring him in today or tomorrow to assess him for possible retention.  If his symptoms are more related to his ongoing complaints of dysuria, I recommend that we start him on a 12-monthcourse of trimethoprim 100 mg daily to down regulate his bladder with findings of chronic cystitis on imaging.  I would like him to return to clinic in 3 months for symptom recheck with Dr. SBernardo Heaterupon completion.

## 2021-01-07 NOTE — Telephone Encounter (Signed)
Spoke with patients daughter and explained the multiple cultures sent for patient. Daughter reports she did not believe that patient was in any kind of pain or discomfort but could not be sure as patient is asleep. She goes on to say she does not believe he has worsened since last week, she thinks he is about the same. She would like to have the opportunity to ask patient and says she will return call tomorrow in regards to an appointment.

## 2021-01-08 LAB — MYCOPLASMA / UREAPLASMA CULTURE
Mycoplasma hominis Culture: NEGATIVE
Ureaplasma urealyticum: NEGATIVE

## 2021-01-09 ENCOUNTER — Telehealth: Payer: Self-pay

## 2021-01-09 DIAGNOSIS — R300 Dysuria: Secondary | ICD-10-CM

## 2021-01-09 MED ORDER — TRIMETHOPRIM 100 MG PO TABS: 50.0000 mg | ORAL_TABLET | Freq: Every day | ORAL | 0 refills | Status: DC

## 2021-01-09 NOTE — Telephone Encounter (Signed)
Notified patients daughter as advised. Sent in ABX Rx, 3 mo F/U scheduled. Daughter verbalized understanding.

## 2021-01-09 NOTE — Telephone Encounter (Signed)
-----   Message from Debroah Loop, [place] sent at 01/09/2021  8:33 AM EDT ----- Final outstanding culture has resulted negative. Assuming no clinical signs of retention, ok to continue with trimethoprim '50mg'$  daily x90 days with symptom recheck appt with Dr. Bernardo Heater upon completion. Please note I previously recommended '100mg'$  daily, however this should be reduced to '50mg'$  daily due to his renal function. ----- Message ----- From: Interface, [facility] Lab Results In Sent: 01/03/2021   7:37 AM EDT To: Debroah Loop, PA-C

## 2021-02-15 ENCOUNTER — Other Ambulatory Visit: Payer: Self-pay

## 2021-02-15 ENCOUNTER — Emergency Department: Payer: Medicare Other

## 2021-02-15 ENCOUNTER — Inpatient Hospital Stay
Admission: EM | Admit: 2021-02-15 | Discharge: 2021-02-19 | DRG: 280 | Disposition: A | Discharge status: Home / Self Care | Payer: Medicare Other | Attending: Family Medicine | Admitting: Family Medicine

## 2021-02-15 DIAGNOSIS — I129 Hypertensive chronic kidney disease with stage 1 through stage 4 chronic kidney disease, or unspecified chronic kidney disease: Secondary | ICD-10-CM | POA: Diagnosis present

## 2021-02-15 DIAGNOSIS — N401 Enlarged prostate with lower urinary tract symptoms: Secondary | ICD-10-CM | POA: Diagnosis present

## 2021-02-15 DIAGNOSIS — N179 Acute kidney failure, unspecified: Secondary | ICD-10-CM | POA: Diagnosis present

## 2021-02-15 DIAGNOSIS — Z515 Encounter for palliative care: Secondary | ICD-10-CM

## 2021-02-15 DIAGNOSIS — K219 Gastro-esophageal reflux disease without esophagitis: Secondary | ICD-10-CM | POA: Diagnosis present

## 2021-02-15 DIAGNOSIS — N390 Urinary tract infection, site not specified: Secondary | ICD-10-CM | POA: Diagnosis present

## 2021-02-15 DIAGNOSIS — B952 Enterococcus as the cause of diseases classified elsewhere: Secondary | ICD-10-CM | POA: Diagnosis present

## 2021-02-15 DIAGNOSIS — I2510 Atherosclerotic heart disease of native coronary artery without angina pectoris: Secondary | ICD-10-CM | POA: Diagnosis present

## 2021-02-15 DIAGNOSIS — M419 Scoliosis, unspecified: Secondary | ICD-10-CM | POA: Diagnosis present

## 2021-02-15 DIAGNOSIS — I161 Hypertensive emergency: Secondary | ICD-10-CM

## 2021-02-15 DIAGNOSIS — E869 Volume depletion, unspecified: Secondary | ICD-10-CM | POA: Diagnosis present

## 2021-02-15 DIAGNOSIS — Z20822 Contact with and (suspected) exposure to covid-19: Secondary | ICD-10-CM | POA: Diagnosis present

## 2021-02-15 DIAGNOSIS — R109 Unspecified abdominal pain: Secondary | ICD-10-CM

## 2021-02-15 DIAGNOSIS — Z7982 Long term (current) use of aspirin: Secondary | ICD-10-CM

## 2021-02-15 DIAGNOSIS — E785 Hyperlipidemia, unspecified: Secondary | ICD-10-CM | POA: Diagnosis present

## 2021-02-15 DIAGNOSIS — J441 Chronic obstructive pulmonary disease with (acute) exacerbation: Secondary | ICD-10-CM | POA: Diagnosis present

## 2021-02-15 DIAGNOSIS — I739 Peripheral vascular disease, unspecified: Secondary | ICD-10-CM | POA: Diagnosis present

## 2021-02-15 DIAGNOSIS — I10 Essential (primary) hypertension: Secondary | ICD-10-CM | POA: Diagnosis present

## 2021-02-15 DIAGNOSIS — R079 Chest pain, unspecified: Secondary | ICD-10-CM

## 2021-02-15 DIAGNOSIS — Z23 Encounter for immunization: Secondary | ICD-10-CM

## 2021-02-15 DIAGNOSIS — Z7902 Long term (current) use of antithrombotics/antiplatelets: Secondary | ICD-10-CM

## 2021-02-15 DIAGNOSIS — I160 Hypertensive urgency: Secondary | ICD-10-CM | POA: Diagnosis not present

## 2021-02-15 DIAGNOSIS — F17210 Nicotine dependence, cigarettes, uncomplicated: Secondary | ICD-10-CM | POA: Diagnosis present

## 2021-02-15 DIAGNOSIS — E875 Hyperkalemia: Secondary | ICD-10-CM | POA: Diagnosis present

## 2021-02-15 DIAGNOSIS — R338 Other retention of urine: Secondary | ICD-10-CM | POA: Diagnosis not present

## 2021-02-15 DIAGNOSIS — U071 COVID-19: Secondary | ICD-10-CM | POA: Diagnosis not present

## 2021-02-15 DIAGNOSIS — W19XXXA Unspecified fall, initial encounter: Secondary | ICD-10-CM | POA: Diagnosis present

## 2021-02-15 DIAGNOSIS — M545 Low back pain, unspecified: Secondary | ICD-10-CM | POA: Diagnosis present

## 2021-02-15 DIAGNOSIS — N184 Chronic kidney disease, stage 4 (severe): Secondary | ICD-10-CM | POA: Diagnosis present

## 2021-02-15 DIAGNOSIS — J449 Chronic obstructive pulmonary disease, unspecified: Secondary | ICD-10-CM | POA: Diagnosis present

## 2021-02-15 DIAGNOSIS — Z79899 Other long term (current) drug therapy: Secondary | ICD-10-CM

## 2021-02-15 DIAGNOSIS — Z66 Do not resuscitate: Secondary | ICD-10-CM | POA: Diagnosis present

## 2021-02-15 DIAGNOSIS — I214 Non-ST elevation (NSTEMI) myocardial infarction: Principal | ICD-10-CM | POA: Diagnosis present

## 2021-02-15 DIAGNOSIS — D631 Anemia in chronic kidney disease: Secondary | ICD-10-CM | POA: Diagnosis present

## 2021-02-15 DIAGNOSIS — G8929 Other chronic pain: Secondary | ICD-10-CM | POA: Diagnosis present

## 2021-02-15 DIAGNOSIS — I700 Atherosclerosis of aorta: Secondary | ICD-10-CM | POA: Diagnosis present

## 2021-02-15 LAB — BASIC METABOLIC PANEL
Anion gap: 5 (ref 5–15)
BUN: 42 mg/dL — ABNORMAL HIGH (ref 8–23)
CO2: 25 mmol/L (ref 22–32)
Calcium: 9 mg/dL (ref 8.9–10.3)
Chloride: 110 mmol/L (ref 98–111)
Creatinine, Ser: 3.7 mg/dL — ABNORMAL HIGH (ref 0.61–1.24)
GFR, Estimated: 15 mL/min — ABNORMAL LOW (ref >60)
Glucose, Bld: 108 mg/dL — ABNORMAL HIGH (ref 70–99)
Potassium: 5.4 mmol/L — ABNORMAL HIGH (ref 3.5–5.1)
Sodium: 140 mmol/L (ref 135–145)

## 2021-02-15 LAB — CBC
HCT: 31.5 % — ABNORMAL LOW (ref 39.0–52.0)
Hemoglobin: 10 g/dL — ABNORMAL LOW (ref 13.0–17.0)
MCH: 32.5 pg (ref 26.0–34.0)
MCHC: 31.7 g/dL (ref 30.0–36.0)
MCV: 102.3 fL — ABNORMAL HIGH (ref 80.0–100.0)
Platelets: 189 10*3/uL (ref 150–400)
RBC: 3.08 MIL/uL — ABNORMAL LOW (ref 4.22–5.81)
RDW: 14.6 % (ref 11.5–15.5)
WBC: 7.1 10*3/uL (ref 4.0–10.5)
nRBC: 0 % (ref 0.0–0.2)

## 2021-02-15 LAB — TROPONIN I (HIGH SENSITIVITY)
Troponin I (High Sensitivity): 12 ng/L (ref <18)
Troponin I (High Sensitivity): 11 ng/L (ref <18)

## 2021-02-15 NOTE — ED Notes (Signed)
Pt waiting outside main entrance of ER, pt smoking a cigarette. RN brought pt back to main wait to get VS an dtook pt back outside with family

## 2021-02-15 NOTE — ED Triage Notes (Signed)
Pt to ED with daughter, pt vision impaired. C/o cp for a past few days and shob.  Denies N/V Started taking prilosec with no relief No cardiac hx

## 2021-02-16 ENCOUNTER — Emergency Department: Payer: Medicare Other

## 2021-02-16 DIAGNOSIS — Z20822 Contact with and (suspected) exposure to covid-19: Secondary | ICD-10-CM | POA: Diagnosis present

## 2021-02-16 DIAGNOSIS — N189 Chronic kidney disease, unspecified: Secondary | ICD-10-CM

## 2021-02-16 DIAGNOSIS — F32A Depression, unspecified: Secondary | ICD-10-CM | POA: Diagnosis not present

## 2021-02-16 DIAGNOSIS — R079 Chest pain, unspecified: Secondary | ICD-10-CM | POA: Diagnosis not present

## 2021-02-16 DIAGNOSIS — K219 Gastro-esophageal reflux disease without esophagitis: Secondary | ICD-10-CM | POA: Diagnosis not present

## 2021-02-16 DIAGNOSIS — I160 Hypertensive urgency: Secondary | ICD-10-CM

## 2021-02-16 DIAGNOSIS — Z9181 History of falling: Secondary | ICD-10-CM | POA: Diagnosis not present

## 2021-02-16 DIAGNOSIS — N401 Enlarged prostate with lower urinary tract symptoms: Secondary | ICD-10-CM | POA: Diagnosis present

## 2021-02-16 DIAGNOSIS — I161 Hypertensive emergency: Secondary | ICD-10-CM | POA: Diagnosis not present

## 2021-02-16 DIAGNOSIS — D631 Anemia in chronic kidney disease: Secondary | ICD-10-CM | POA: Diagnosis present

## 2021-02-16 DIAGNOSIS — N390 Urinary tract infection, site not specified: Secondary | ICD-10-CM | POA: Diagnosis present

## 2021-02-16 DIAGNOSIS — E869 Volume depletion, unspecified: Secondary | ICD-10-CM | POA: Diagnosis present

## 2021-02-16 DIAGNOSIS — R338 Other retention of urine: Secondary | ICD-10-CM | POA: Diagnosis not present

## 2021-02-16 DIAGNOSIS — I700 Atherosclerosis of aorta: Secondary | ICD-10-CM | POA: Diagnosis present

## 2021-02-16 DIAGNOSIS — N184 Chronic kidney disease, stage 4 (severe): Secondary | ICD-10-CM

## 2021-02-16 DIAGNOSIS — W19XXXA Unspecified fall, initial encounter: Secondary | ICD-10-CM | POA: Diagnosis present

## 2021-02-16 DIAGNOSIS — E785 Hyperlipidemia, unspecified: Secondary | ICD-10-CM | POA: Diagnosis present

## 2021-02-16 DIAGNOSIS — F17200 Nicotine dependence, unspecified, uncomplicated: Secondary | ICD-10-CM | POA: Diagnosis not present

## 2021-02-16 DIAGNOSIS — F17210 Nicotine dependence, cigarettes, uncomplicated: Secondary | ICD-10-CM | POA: Diagnosis present

## 2021-02-16 DIAGNOSIS — G8929 Other chronic pain: Secondary | ICD-10-CM | POA: Diagnosis present

## 2021-02-16 DIAGNOSIS — Z66 Do not resuscitate: Secondary | ICD-10-CM | POA: Diagnosis present

## 2021-02-16 DIAGNOSIS — I214 Non-ST elevation (NSTEMI) myocardial infarction: Secondary | ICD-10-CM | POA: Diagnosis not present

## 2021-02-16 DIAGNOSIS — Z515 Encounter for palliative care: Secondary | ICD-10-CM | POA: Diagnosis not present

## 2021-02-16 DIAGNOSIS — J449 Chronic obstructive pulmonary disease, unspecified: Secondary | ICD-10-CM | POA: Diagnosis present

## 2021-02-16 DIAGNOSIS — U071 COVID-19: Secondary | ICD-10-CM | POA: Diagnosis not present

## 2021-02-16 DIAGNOSIS — J441 Chronic obstructive pulmonary disease with (acute) exacerbation: Secondary | ICD-10-CM | POA: Diagnosis present

## 2021-02-16 DIAGNOSIS — I739 Peripheral vascular disease, unspecified: Secondary | ICD-10-CM | POA: Diagnosis present

## 2021-02-16 DIAGNOSIS — I129 Hypertensive chronic kidney disease with stage 1 through stage 4 chronic kidney disease, or unspecified chronic kidney disease: Secondary | ICD-10-CM | POA: Diagnosis present

## 2021-02-16 DIAGNOSIS — I2510 Atherosclerotic heart disease of native coronary artery without angina pectoris: Secondary | ICD-10-CM | POA: Diagnosis present

## 2021-02-16 DIAGNOSIS — M545 Low back pain, unspecified: Secondary | ICD-10-CM | POA: Diagnosis present

## 2021-02-16 DIAGNOSIS — N179 Acute kidney failure, unspecified: Secondary | ICD-10-CM | POA: Diagnosis present

## 2021-02-16 DIAGNOSIS — B952 Enterococcus as the cause of diseases classified elsewhere: Secondary | ICD-10-CM | POA: Diagnosis present

## 2021-02-16 DIAGNOSIS — Z23 Encounter for immunization: Secondary | ICD-10-CM | POA: Diagnosis present

## 2021-02-16 DIAGNOSIS — I10 Essential (primary) hypertension: Secondary | ICD-10-CM | POA: Diagnosis not present

## 2021-02-16 LAB — BASIC METABOLIC PANEL
Anion gap: 6 (ref 5–15)
BUN: 39 mg/dL — ABNORMAL HIGH (ref 8–23)
CO2: 24 mmol/L (ref 22–32)
Calcium: 8.9 mg/dL (ref 8.9–10.3)
Chloride: 109 mmol/L (ref 98–111)
Creatinine, Ser: 3.22 mg/dL — ABNORMAL HIGH (ref 0.61–1.24)
GFR, Estimated: 18 mL/min — ABNORMAL LOW (ref >60)
Glucose, Bld: 89 mg/dL (ref 70–99)
Potassium: 5 mmol/L (ref 3.5–5.1)
Sodium: 139 mmol/L (ref 135–145)

## 2021-02-16 LAB — URINALYSIS, COMPLETE (UACMP) WITH MICROSCOPIC
Bacteria, UA: NONE SEEN
Bilirubin Urine: NEGATIVE
Glucose, UA: NEGATIVE mg/dL
Hgb urine dipstick: NEGATIVE
Ketones, ur: NEGATIVE mg/dL
Nitrite: NEGATIVE
Protein, ur: NEGATIVE mg/dL
Specific Gravity, Urine: 1.013 (ref 1.005–1.030)
Squamous Epithelial / HPF: NONE SEEN (ref 0–5)
pH: 6 (ref 5.0–8.0)

## 2021-02-16 LAB — CBC
HCT: 32.2 % — ABNORMAL LOW (ref 39.0–52.0)
Hemoglobin: 10.4 g/dL — ABNORMAL LOW (ref 13.0–17.0)
MCH: 33 pg (ref 26.0–34.0)
MCHC: 32.3 g/dL (ref 30.0–36.0)
MCV: 102.2 fL — ABNORMAL HIGH (ref 80.0–100.0)
Platelets: 175 10*3/uL (ref 150–400)
RBC: 3.15 MIL/uL — ABNORMAL LOW (ref 4.22–5.81)
RDW: 14.5 % (ref 11.5–15.5)
WBC: 7.8 10*3/uL (ref 4.0–10.5)
nRBC: 0 % (ref 0.0–0.2)

## 2021-02-16 LAB — RESP PANEL BY RT-PCR (FLU A&B, COVID) ARPGX2
Influenza A by PCR: NEGATIVE
Influenza B by PCR: NEGATIVE
SARS Coronavirus 2 by RT PCR: NEGATIVE

## 2021-02-16 LAB — TROPONIN I (HIGH SENSITIVITY): Troponin I (High Sensitivity): 15 ng/L (ref <18)

## 2021-02-16 MED ORDER — TAMSULOSIN HCL 0.4 MG PO CAPS
0.8000 mg | ORAL_CAPSULE | Freq: Every day | ORAL | Status: DC
  Administered 2021-02-16 – 2021-02-19 (×4): 0.8 mg via ORAL
  Filled 2021-02-16 (×4): qty 2

## 2021-02-16 MED ORDER — CALCITRIOL 0.25 MCG PO CAPS
0.2500 ug | ORAL_CAPSULE | Freq: Every day | ORAL | Status: DC
  Administered 2021-02-16 – 2021-02-19 (×4): 0.25 ug via ORAL
  Filled 2021-02-16 (×4): qty 1

## 2021-02-16 MED ORDER — SODIUM CHLORIDE 0.9 % IV SOLN: INTRAVENOUS | Status: DC

## 2021-02-16 MED ORDER — HYDRALAZINE HCL 20 MG/ML IJ SOLN
5.0000 mg | Freq: Once | INTRAMUSCULAR | Status: AC
  Administered 2021-02-16: 5 mg via INTRAVENOUS
  Filled 2021-02-16: qty 1

## 2021-02-16 MED ORDER — CHLORHEXIDINE GLUCONATE CLOTH 2 % EX PADS
6.0000 | MEDICATED_PAD | Freq: Every day | CUTANEOUS | Status: DC
  Administered 2021-02-17 – 2021-02-19 (×3): 6 via TOPICAL

## 2021-02-16 MED ORDER — TRAZODONE HCL 50 MG PO TABS
150.0000 mg | ORAL_TABLET | Freq: Every day | ORAL | Status: DC
  Administered 2021-02-16 – 2021-02-18 (×3): 150 mg via ORAL
  Filled 2021-02-16 (×3): qty 1

## 2021-02-16 MED ORDER — MELATONIN 5 MG PO TABS
5.0000 mg | ORAL_TABLET | Freq: Every day | ORAL | Status: DC
  Administered 2021-02-16 – 2021-02-18 (×3): 5 mg via ORAL
  Filled 2021-02-16 (×3): qty 1

## 2021-02-16 MED ORDER — AMLODIPINE BESYLATE 5 MG PO TABS
5.0000 mg | ORAL_TABLET | Freq: Every day | ORAL | Status: DC
  Administered 2021-02-16 – 2021-02-17 (×2): 5 mg via ORAL
  Filled 2021-02-16 (×2): qty 1

## 2021-02-16 MED ORDER — LATANOPROST 0.005 % OP SOLN
1.0000 [drp] | Freq: Every day | OPHTHALMIC | Status: DC
  Administered 2021-02-16 – 2021-02-18 (×3): 1 [drp] via OPHTHALMIC
  Filled 2021-02-16: qty 2.5

## 2021-02-16 MED ORDER — LACTATED RINGERS IV BOLUS
1000.0000 mL | Freq: Once | INTRAVENOUS | Status: AC
  Administered 2021-02-16: 1000 mL via INTRAVENOUS

## 2021-02-16 MED ORDER — TRAZODONE HCL 50 MG PO TABS: 25.0000 mg | ORAL_TABLET | Freq: Every evening | ORAL | Status: DC | PRN

## 2021-02-16 MED ORDER — ONDANSETRON HCL 4 MG PO TABS: 4.0000 mg | ORAL_TABLET | Freq: Four times a day (QID) | ORAL | Status: DC | PRN

## 2021-02-16 MED ORDER — ACETAMINOPHEN 650 MG RE SUPP: 650.0000 mg | Freq: Four times a day (QID) | RECTAL | Status: DC | PRN

## 2021-02-16 MED ORDER — ENOXAPARIN SODIUM 30 MG/0.3ML IJ SOSY
30.0000 mg | PREFILLED_SYRINGE | INTRAMUSCULAR | Status: DC
  Administered 2021-02-16 – 2021-02-17 (×2): 30 mg via SUBCUTANEOUS
  Filled 2021-02-16 (×2): qty 0.3

## 2021-02-16 MED ORDER — MAGNESIUM HYDROXIDE 400 MG/5ML PO SUSP: 30.0000 mL | Freq: Every day | ORAL | Status: DC | PRN

## 2021-02-16 MED ORDER — ACETAMINOPHEN 325 MG PO TABS: 650.0000 mg | ORAL_TABLET | Freq: Four times a day (QID) | ORAL | Status: DC | PRN

## 2021-02-16 MED ORDER — ASPIRIN 81 MG PO CHEW
81.0000 mg | CHEWABLE_TABLET | Freq: Every day | ORAL | Status: DC
  Administered 2021-02-16 – 2021-02-19 (×4): 81 mg via ORAL
  Filled 2021-02-16 (×4): qty 1

## 2021-02-16 MED ORDER — ONDANSETRON HCL 4 MG/2ML IJ SOLN: 4.0000 mg | Freq: Four times a day (QID) | INTRAMUSCULAR | Status: DC | PRN

## 2021-02-16 MED ORDER — HYDRALAZINE HCL 20 MG/ML IJ SOLN
10.0000 mg | Freq: Four times a day (QID) | INTRAMUSCULAR | Status: DC | PRN
  Administered 2021-02-16 (×2): 10 mg via INTRAVENOUS
  Filled 2021-02-16 (×2): qty 1

## 2021-02-16 MED ORDER — TRIMETHOPRIM 100 MG PO TABS
50.0000 mg | ORAL_TABLET | Freq: Every day | ORAL | Status: DC
  Administered 2021-02-16 – 2021-02-17 (×2): 50 mg via ORAL
  Filled 2021-02-16 (×2): qty 1

## 2021-02-16 MED ORDER — CITALOPRAM HYDROBROMIDE 20 MG PO TABS
10.0000 mg | ORAL_TABLET | Freq: Every day | ORAL | Status: DC
  Administered 2021-02-16 – 2021-02-19 (×4): 10 mg via ORAL
  Filled 2021-02-16 (×4): qty 1

## 2021-02-16 MED ORDER — FINASTERIDE 5 MG PO TABS
5.0000 mg | ORAL_TABLET | Freq: Every day | ORAL | Status: DC
  Administered 2021-02-16 – 2021-02-19 (×4): 5 mg via ORAL
  Filled 2021-02-16 (×4): qty 1

## 2021-02-16 MED ORDER — NITROGLYCERIN 2 % TD OINT
1.0000 [in_us] | TOPICAL_OINTMENT | Freq: Once | TRANSDERMAL | Status: AC
  Administered 2021-02-16: 1 [in_us] via TOPICAL
  Filled 2021-02-16: qty 1

## 2021-02-16 MED ORDER — VITAMIN D 25 MCG (1000 UNIT) PO TABS
1000.0000 [IU] | ORAL_TABLET | Freq: Every day | ORAL | Status: DC
  Administered 2021-02-16 – 2021-02-19 (×4): 1000 [IU] via ORAL
  Filled 2021-02-16 (×4): qty 1

## 2021-02-16 MED ORDER — PANTOPRAZOLE SODIUM 40 MG PO TBEC
40.0000 mg | DELAYED_RELEASE_TABLET | Freq: Every day | ORAL | Status: DC
  Administered 2021-02-16 – 2021-02-19 (×4): 40 mg via ORAL
  Filled 2021-02-16 (×4): qty 1

## 2021-02-16 NOTE — H&P (Signed)
[place]   PATIENT NAME: Max Ellis    MR#:  [id number]  DATE OF BIRTH:  29-Feb-1934  DATE OF ADMISSION:  02/15/2021  PRIMARY CARE PHYSICIAN: Maryland Pink, MD   Patient is coming from: Home  REQUESTING/REFERRING PHYSICIAN: Hinda Kehr, MD  CHIEF COMPLAINT:   Chief Complaint  Patient presents with   Chest Pain    HISTORY OF PRESENT ILLNESS:  Max Ellis is a 87 y.o. African-American male with medical history significant for COPD, hypertension, peripheral vascular disease, stage IV chronic kidney disease, who presented to the emergency room with acute onset of substernal chest pain with associated mild dyspnea and feeling of bloating with no nausea or vomiting or abdominal pain.  He admits to headache and diminished urine output without dysuria or hematuria or flank pain.  No dizziness or paresthesias or focal muscle weakness.  No cough or wheezing or hemoptysis.  No leg pain or edema recent travels or surgeries.    ED Course: When he came to the ER blood pressure was 178/66 with a respirate of 22 with otherwise normal vital signs.  Labs revealed mild hyperkalemia 5.4 and a BUN of 42 with a creatinine of 3.7.  High-sensitivity troponin I was 12 and later 11.  CBC showed anemia.  Influenza antigens and COVID-19 PCR came back negative.    EKG as reviewed by me : Showed normal sinus rhythm with a rate of 63 with Q waves septally Imaging: 2-view chest x-ray showed no acute cardiopulmonary disease and stable right apical scarring.  Abdominal pelvic CT scan revealed: No acute intrathoracic or intra-abdominal pathology identified. No definite radiographic explanation for the patient's reported symptoms.   Extensive multi-vessel coronary artery calcification.   Stable asymmetric right apical pulmonary tumefactive scarring, likely the result of remote infection or inflammation.   Stable scattered pulmonary nodules within the lungs bilaterally, with a dominant nodule noted  within the right upper lobe measuring 5-6 mm. These are stable since remote prior examination of 01/11/2019 and are safely considered benign.   Peripheral vascular disease. If there is clinical evidence of lower extremity arterial insufficiency, CT arteriography may be more helpful for further evaluation.   Aortic Atherosclerosis.  The patient was given 1 L bolus of IV lactated Ringer and 5 mg of IV hydralazine and 1 inch of Nitropaste.  He will be admitted to a progressive unit bed for further evaluation and management PAST MEDICAL HISTORY:   Past Medical History:  Diagnosis Date   COPD (chronic obstructive pulmonary disease) ([facility])    Hypertension    Peripheral vascular disease ([place])    Renal disorder    Stage 4 chronic kidney disease ([place])     PAST SURGICAL HISTORY:  No past surgical history on file. He did not remember any previous surgeries. SOCIAL HISTORY:   Social History   Tobacco Use   Smoking status: Every Day    Types: Cigarettes   Smokeless tobacco: Former    Types: Chew  Substance Use Topics   Alcohol use: No    FAMILY HISTORY:  No family history on file.  No pertinent familial diseases.  DRUG ALLERGIES:  No Known Allergies  REVIEW OF SYSTEMS:   ROS As per history of present illness. All pertinent systems were reviewed above. Constitutional, HEENT, cardiovascular, respiratory, GI, GU, musculoskeletal, neuro, psychiatric, endocrine, integumentary and hematologic systems were reviewed and are otherwise negative/unremarkable except for positive findings mentioned above in the HPI.   MEDICATIONS AT HOME:  Prior to Admission medications   Medication Sig Start Date End Date Taking? Authorizing Provider  ASPIRIN 81 PO Take 81 mg by mouth daily.   Yes [provider]  calcitRIOL (ROCALTROL) 0.25 MCG capsule Take 0.25 mcg by mouth daily. 09/18/19  Yes [provider]  cholecalciferol (VITAMIN D3) 25 MCG (1000 UT) tablet Take 1,000 Units by  mouth daily.   Yes [provider]  citalopram (CELEXA) 10 MG tablet Take 10 mg by mouth daily.   Yes [provider]  finasteride (PROSCAR) 5 MG tablet Take 5 mg by mouth daily. 12/30/16  Yes [provider]  furosemide (LASIX) 40 MG tablet Take 40 mg by mouth daily. 12/29/20  Yes [provider]  latanoprost (XALATAN) 0.005 % ophthalmic solution Place 1 drop into both eyes at bedtime. 02/15/20  Yes [provider]  omeprazole (PRILOSEC) 20 MG capsule Take 20 mg by mouth daily.   Yes [provider]  tamsulosin (FLOMAX) 0.4 MG CAPS capsule Take 0.8 mg by mouth daily.   Yes [provider]  traZODone (DESYREL) 50 MG tablet Take 150 mg by mouth at bedtime.   Yes [provider]  trimethoprim (TRIMPEX) 100 MG tablet Take 0.5 tablets (50 mg total) by mouth daily. 01/09/21 04/09/21 Yes Vaillancourt, Samantha, PA-C  famotidine (PEPCID) 20 MG tablet Take by mouth. Patient not taking: No sig reported 02/26/20 02/25/21  [provider]  predniSONE (STERAPRED UNI-PAK 21 TAB) 10 MG (21) TBPK tablet Dispense steroid taper pack as directed Patient not taking: No sig reported 01/11/19   Earleen Newport, MD      VITAL SIGNS:  Blood pressure (!) 197/64, pulse 60, temperature 98 F (36.7 C), temperature source Oral, resp. rate 12, height '6\' 2"'$  (1.88 m), weight 68 kg, SpO2 100 %.  PHYSICAL EXAMINATION:  Physical Exam  GENERAL:  87 y.o.-year-old African-American male patient lying in the bed with no acute distress.  EYES: Pupils equal, round, reactive to light and accommodation. No scleral icterus. Extraocular muscles intact.  HEENT: Head atraumatic, normocephalic. Oropharynx and nasopharynx clear.  NECK:  Supple, no jugular venous distention. No thyroid enlargement, no tenderness.  LUNGS: Normal breath sounds bilaterally, no wheezing, rales,rhonchi or crepitation. No use of accessory muscles of respiration.  CARDIOVASCULAR:  Regular rate and rhythm, S1, S2 normal. No murmurs, rubs, or gallops. Chest wall: Has bilateral chest wall deformity possibly pectus excavatum  but could be related to malunion from previous old rib fractures. ABDOMEN: Soft, nondistended, nontender. Bowel sounds present. No organomegaly or mass.  EXTREMITIES: No pedal edema, cyanosis, or clubbing.  NEUROLOGIC: Cranial nerves II through XII are intact. Muscle strength 5/5 in all extremities. Sensation intact. Gait not checked.  PSYCHIATRIC: The patient is alert and oriented x 3.  Normal affect and good eye contact. SKIN: No obvious rash, lesion, or ulcer.   LABORATORY PANEL:   CBC Recent Labs  Lab 02/15/21 1828  WBC 7.1  HGB 10.0*  HCT 31.5*  PLT 189   ------------------------------------------------------------------------------------------------------------------  Chemistries  Recent Labs  Lab 02/15/21 1828  NA 140  K 5.4*  CL 110  CO2 25  GLUCOSE 108*  BUN 42*  CREATININE 3.70*  CALCIUM 9.0   ------------------------------------------------------------------------------------------------------------------  Cardiac Enzymes No results for input(s): TROPONINI in the last 168 hours. ------------------------------------------------------------------------------------------------------------------  RADIOLOGY:  DG Chest 2 View  Result Date: 02/15/2021 CLINICAL DATA:  Chest pain and shortness of breath for several days EXAM: CHEST - 2 VIEW COMPARISON:  01/11/2019, 04/15/2020 FINDINGS: Frontal  and lateral views of the chest demonstrate a stable cardiac silhouette. Stable spiculated density at the right apex compatible with scarring based on previous CT imaging. No acute airspace disease, effusion, or pneumothorax. No acute bony abnormalities. IMPRESSION: 1. No acute intrathoracic process. 2. Stable right apical scarring. Electronically Signed   By: Randa Ngo M.D.   On: 02/15/2021 18:50   CT CHEST ABDOMEN PELVIS WO  CONTRAST  Result Date: 02/16/2021 CLINICAL DATA:  Chest pain, dyspnea, unspecified abdominal pain EXAM: CT CHEST, ABDOMEN AND PELVIS WITHOUT CONTRAST TECHNIQUE: Multidetector CT imaging of the chest, abdomen and pelvis was performed following the standard protocol without IV contrast. COMPARISON:  04/15/2020 FINDINGS: CT CHEST FINDINGS Cardiovascular: Extensive multi-vessel coronary artery calcification. Global cardiac size within normal limits. No pericardial effusion. Central pulmonary arteries are of normal caliber. Moderate atherosclerotic calcification within the thoracic aorta. No aortic aneurysm. Mediastinum/Nodes: No pathologic thoracic adenopathy. Visualized thyroid is unremarkable. The esophagus appears patulous, but is otherwise unremarkable. Lungs/Pleura: Asymmetric right apical scarring, likely post infectious or post inflammatory in nature, appears stable. 5-6 mm subpleural pulmonary nodule within the right upper lobe, axial image # 78/5, is stable. Additional scattered 2-3 mm pulmonary nodules throughout the lungs bilaterally are also stable. No new focal pulmonary nodules or infiltrates. No pneumothorax or pleural effusion. Central airways are widely patent. Musculoskeletal: Multiple mild anterior wedge deformities of the midthoracic spine with associated mild thoracic dextroscoliosis is stable since prior examination. No acute bone abnormality. No lytic or blastic bone lesion. CT ABDOMEN PELVIS FINDINGS Hepatobiliary: No focal liver abnormality is seen. No gallstones, gallbladder wall thickening, or biliary dilatation. Pancreas: Unremarkable Spleen: Unremarkable Adrenals/Urinary Tract: The adrenal glands are unremarkable. The kidneys are normal in position. The kidneys are mildly atrophic bilaterally, stable since prior examination. Vascular calcifications are noted within the renal hila bilaterally. No hydronephrosis. No urinary calculi. No perinephric fluid collections are seen. The bladder is  unremarkable. Stomach/Bowel: Stomach is within normal limits. Appendix appears normal. No evidence of bowel wall thickening, distention, or inflammatory changes. No free intraperitoneal gas or fluid. Vascular/Lymphatic: Extensive aortoiliac atherosclerotic calcification. Particularly prominent atherosclerotic calcification noted within the lower extremity arterial inflow and visualized outflow. No aortic aneurysm. No pathologic adenopathy within the abdomen and pelvis. Reproductive: Prostate is unremarkable. Other: No abdominal wall hernia.  Rectum unremarkable. Musculoskeletal: Degenerative changes are seen within the lumbar spine. No acute bone abnormality. No lytic or blastic bone lesion identified. IMPRESSION: No acute intrathoracic or intra-abdominal pathology identified. No definite radiographic explanation for the patient's reported symptoms. Extensive multi-vessel coronary artery calcification. Stable asymmetric right apical pulmonary tumefactive scarring, likely the result of remote infection or inflammation. Stable scattered pulmonary nodules within the lungs bilaterally, with a dominant nodule noted within the right upper lobe measuring 5-6 mm. These are stable since remote prior examination of 01/11/2019 and are safely considered benign. Peripheral vascular disease. If there is clinical evidence of lower extremity arterial insufficiency, CT arteriography may be more helpful for further evaluation. Aortic Atherosclerosis (ICD10-I70.0). Electronically Signed   By: Fidela Salisbury MD   On: 02/16/2021 02:07      IMPRESSION AND PLAN:  Active Problems:   Hypertensive urgency  1.  Hypertensive urgency. - The patient be admitted to a progressive unit bed. - Should be placed on as needed IV hydralazine. - Resume her antihypertensives.  2.  Chest pain, rule out acute coronary syndrome. - We will follow serial troponin I's. - We will have serial EKGs. - We will place him on aspirin  as well as as  needed sublingual nitroglycerin and morphine sulfate for pain. - Cardiology consult will be obtained. - I notified Dr. Clayborn Bigness about the patient.  3.  Acute kidney injury on top of stage IV chronic kidney disease. - The patient will be hydrated with IV normal saline and will follow his BMP.  4.  BPH. - We will continue Flomax and Proscar.  5.  GERD. - We will continue PPI therapy and H2 blocker therapy.  DVT prophylaxis: Lovenox.  Code Status: full code.  Family Communication:  The plan of care was discussed in details with the patient (and family). I answered all questions. The patient agreed to proceed with the above mentioned plan. Further management will depend upon hospital course. Disposition Plan: Back to previous home environment Consults called: Cardiology consult. All the records are reviewed and case discussed with ED provider.  Status is: Inpatient  Remains inpatient appropriate because:Ongoing active pain requiring inpatient pain management, Ongoing diagnostic testing needed not appropriate for outpatient work up, Unsafe d/c plan, IV treatments appropriate due to intensity of illness or inability to take PO, and Inpatient level of care appropriate due to severity of illness  Dispo: The patient is from: Home              Anticipated d/c is to: Home              Patient currently is not medically stable to d/c.   Difficult to place patient No   TOTAL TIME TAKING CARE OF THIS PATIENT: 55 minutes.    Christel Mormon M.D on 02/16/2021 at 3:54 AM  [facility]   From 7 PM-7 AM, contact night-coverage [email or web address]  CC: Primary care physician; Maryland Pink, MD

## 2021-02-16 NOTE — ED Provider Notes (Signed)
[facility] Emergency Department Provider Note  ____________________________________________   Event Date/Time   First MD Initiated Contact with Patient 02/16/21 0001     (approximate)  I have reviewed the triage vital signs and the nursing notes.   HISTORY  Chief Complaint Chest Pain    HPI Max Ellis is a 87 y.o. male with medical history as listed below which notably includes stage IV chronic kidney disease and peripheral vascular disease as well as hypertension but he and his daughter say that he was taken off all of his blood pressure medicine because they were told he did not need it.   He presents tonight for persistent chest pain as well as some shortness of breath over the last few days.  He says he has been eating and drinking okay.  He has not had any nausea or vomiting.  He tried taking some Prilosec but it has not helped.  He has no known history of heart issues.  He was taken off his blood pressure medicine "a while ago" because his blood pressure was consistently low.  He describes the chest pain as sharp and in the lower middle part of his chest near the top of his abdomen.  He denies any abdominal pain.  Nothing in particular makes the symptoms better or worse.He denies fever/chills, sore throat, nausea, vomiting, and dysuria.     Past Medical History:  Diagnosis Date   COPD (chronic obstructive pulmonary disease) ([place])    Hypertension    Peripheral vascular disease ([place])    Renal disorder    Stage 4 chronic kidney disease ([place])     Patient Active Problem List   Diagnosis Date Noted   Hypertensive urgency 02/16/2021   Benign prostatic hyperplasia with incomplete bladder emptying 03/03/2020   Essential hypertension 01/26/2017   Tobacco use disorder 01/26/2017   Pain in limb 01/26/2017    No past surgical history on file.  Prior to Admission medications   Medication Sig Start Date End Date Taking? Authorizing Provider   ASPIRIN 81 PO Take 81 mg by mouth daily.   Yes [provider]  calcitRIOL (ROCALTROL) 0.25 MCG capsule Take 0.25 mcg by mouth daily. 09/18/19  Yes [provider]  cholecalciferol (VITAMIN D3) 25 MCG (1000 UT) tablet Take 1,000 Units by mouth daily.   Yes [provider]  citalopram (CELEXA) 10 MG tablet Take 10 mg by mouth daily.   Yes [provider]  finasteride (PROSCAR) 5 MG tablet Take 5 mg by mouth daily. 12/30/16  Yes [provider]  furosemide (LASIX) 40 MG tablet Take 40 mg by mouth daily. 12/29/20  Yes [provider]  latanoprost (XALATAN) 0.005 % ophthalmic solution Place 1 drop into both eyes at bedtime. 02/15/20  Yes [provider]  omeprazole (PRILOSEC) 20 MG capsule Take 20 mg by mouth daily.   Yes [provider]  tamsulosin (FLOMAX) 0.4 MG CAPS capsule Take 0.8 mg by mouth daily.   Yes [provider]  traZODone (DESYREL) 50 MG tablet Take 150 mg by mouth at bedtime.   Yes [provider]  trimethoprim (TRIMPEX) 100 MG tablet Take 0.5 tablets (50 mg total) by mouth daily. 01/09/21 04/09/21 Yes Vaillancourt, Samantha, PA-C  famotidine (PEPCID) 20 MG tablet Take by mouth. Patient not taking: No sig reported 02/26/20 02/25/21  [provider]  predniSONE (STERAPRED UNI-PAK 21 TAB) 10 MG (21) TBPK tablet Dispense steroid taper pack as directed Patient not taking: No sig  reported 01/11/19   Earleen Newport, MD    Allergies Patient has no known allergies.  No family history on file.  Social History Social History   Tobacco Use   Smoking status: Every Day    Types: Cigarettes   Smokeless tobacco: Former    Types: Chew  Substance Use Topics   Alcohol use: No   Drug use: No    Review of Systems Constitutional: No fever/chills Eyes: No visual changes. ENT: No sore throat. Cardiovascular: Positive for chest pain. Respiratory: Positive for shortness of  breath. Gastrointestinal: No abdominal pain.  No nausea, no vomiting.  No diarrhea.  No constipation. Genitourinary: Negative for dysuria. Musculoskeletal: Negative for neck pain.  Negative for back pain. Integumentary: Negative for rash. Neurological: Negative for headaches, focal weakness or numbness.   ____________________________________________   PHYSICAL EXAM:  VITAL SIGNS: ED Triage Vitals  Enc Vitals Group     BP 02/15/21 1829 (!) 178/66     Pulse Rate 02/15/21 1829 61     Resp 02/15/21 1829 (!) 22     Temp 02/15/21 1829 98 F (36.7 C)     Temp Source 02/15/21 1829 Oral     SpO2 02/15/21 1829 100 %     Weight 02/15/21 1827 68 kg (150 lb)     Height 02/15/21 1827 1.88 m ('6\' 2"'$ )     Head Circumference --      Peak Flow --      Pain Score 02/15/21 1826 8     Pain Loc --      Pain Edu? --      Excl. in [place]? --     Constitutional: Alert and oriented.  Elderly with sequela of some chronic illness but not in severe distress. Eyes: Conjunctivae are normal.  Head: Atraumatic. Nose: No congestion/rhinnorhea. Mouth/Throat: Patient is wearing a mask. Neck: No stridor.  No meningeal signs.   Cardiovascular: Normal rate, regular rhythm. Good peripheral circulation. Respiratory: Normal respiratory effort.  No retractions.  Lungs are clear to auscultation bilaterally with no wheezes, rales, nor rhonchi. Gastrointestinal: Soft and nondistended, in fact the abdomen is somewhat sunken from the rib cage.  Mild tenderness to palpation of the epigastrium, no right upper quadrant tenderness and negative Murphy sign. Musculoskeletal: The patient has exceedingly prominent rib cage bilaterally with a somewhat sunken abdomen.  He and his daughter say this is normal and that he has always been this way.  No other acute abnormalities noted.  Some mild tenderness to palpation of the lower sternum. Neurologic:  Normal speech and language. No gross focal neurologic deficits are appreciated.  Skin:   Skin is warm, dry and intact.  Chronic skin thickening bilateral lower extremities consistent with peripheral vascular disease and poor venous return. Psychiatric: Mood and affect are normal. Speech and behavior are normal.  ____________________________________________   LABS (all labs ordered are listed, but only abnormal results are displayed)  Labs Reviewed  BASIC METABOLIC PANEL - Abnormal; Notable for the following components:      Result Value   Potassium 5.4 (*)    Glucose, Bld 108 (*)    BUN 42 (*)    Creatinine, Ser 3.70 (*)    GFR, Estimated 15 (*)    All other components within normal limits  CBC - Abnormal; Notable for the following components:   RBC 3.08 (*)    Hemoglobin 10.0 (*)    HCT 31.5 (*)    MCV 102.3 (*)    All other components  within normal limits  RESP PANEL BY RT-PCR (FLU A&B, COVID) ARPGX2  TROPONIN I (HIGH SENSITIVITY)  TROPONIN I (HIGH SENSITIVITY)   ____________________________________________  EKG  ED ECG REPORT I, Hinda Kehr, the attending physician, personally viewed and interpreted this ECG.  Date: 02/15/2021 EKG Time: 18: 26 Rate: 63 Rhythm: normal sinus rhythm QRS Axis: normal Intervals: normal ST/T Wave abnormalities: normal Narrative Interpretation: no evidence of acute ischemia  ____________________________________________  RADIOLOGY I, Hinda Kehr, personally viewed and evaluated these images (plain radiographs) as part of my medical decision making, as well as reviewing the written report by the radiologist.  ED MD interpretation: No acute abnormalities identified on chest x-ray nor CT scan of the chest/abdomen/pelvis.  Stable pulmonary nodules on CT.  Official radiology report(s): DG Chest 2 View  Result Date: 02/15/2021 CLINICAL DATA:  Chest pain and shortness of breath for several days EXAM: CHEST - 2 VIEW COMPARISON:  01/11/2019, 04/15/2020 FINDINGS: Frontal and lateral views of the chest demonstrate a stable cardiac  silhouette. Stable spiculated density at the right apex compatible with scarring based on previous CT imaging. No acute airspace disease, effusion, or pneumothorax. No acute bony abnormalities. IMPRESSION: 1. No acute intrathoracic process. 2. Stable right apical scarring. Electronically Signed   By: Randa Ngo M.D.   On: 02/15/2021 18:50   CT CHEST ABDOMEN PELVIS WO CONTRAST  Result Date: 02/16/2021 CLINICAL DATA:  Chest pain, dyspnea, unspecified abdominal pain EXAM: CT CHEST, ABDOMEN AND PELVIS WITHOUT CONTRAST TECHNIQUE: Multidetector CT imaging of the chest, abdomen and pelvis was performed following the standard protocol without IV contrast. COMPARISON:  04/15/2020 FINDINGS: CT CHEST FINDINGS Cardiovascular: Extensive multi-vessel coronary artery calcification. Global cardiac size within normal limits. No pericardial effusion. Central pulmonary arteries are of normal caliber. Moderate atherosclerotic calcification within the thoracic aorta. No aortic aneurysm. Mediastinum/Nodes: No pathologic thoracic adenopathy. Visualized thyroid is unremarkable. The esophagus appears patulous, but is otherwise unremarkable. Lungs/Pleura: Asymmetric right apical scarring, likely post infectious or post inflammatory in nature, appears stable. 5-6 mm subpleural pulmonary nodule within the right upper lobe, axial image # 78/5, is stable. Additional scattered 2-3 mm pulmonary nodules throughout the lungs bilaterally are also stable. No new focal pulmonary nodules or infiltrates. No pneumothorax or pleural effusion. Central airways are widely patent. Musculoskeletal: Multiple mild anterior wedge deformities of the midthoracic spine with associated mild thoracic dextroscoliosis is stable since prior examination. No acute bone abnormality. No lytic or blastic bone lesion. CT ABDOMEN PELVIS FINDINGS Hepatobiliary: No focal liver abnormality is seen. No gallstones, gallbladder wall thickening, or biliary dilatation. Pancreas:  Unremarkable Spleen: Unremarkable Adrenals/Urinary Tract: The adrenal glands are unremarkable. The kidneys are normal in position. The kidneys are mildly atrophic bilaterally, stable since prior examination. Vascular calcifications are noted within the renal hila bilaterally. No hydronephrosis. No urinary calculi. No perinephric fluid collections are seen. The bladder is unremarkable. Stomach/Bowel: Stomach is within normal limits. Appendix appears normal. No evidence of bowel wall thickening, distention, or inflammatory changes. No free intraperitoneal gas or fluid. Vascular/Lymphatic: Extensive aortoiliac atherosclerotic calcification. Particularly prominent atherosclerotic calcification noted within the lower extremity arterial inflow and visualized outflow. No aortic aneurysm. No pathologic adenopathy within the abdomen and pelvis. Reproductive: Prostate is unremarkable. Other: No abdominal wall hernia.  Rectum unremarkable. Musculoskeletal: Degenerative changes are seen within the lumbar spine. No acute bone abnormality. No lytic or blastic bone lesion identified. IMPRESSION: No acute intrathoracic or intra-abdominal pathology identified. No definite radiographic explanation for the patient's reported symptoms. Extensive multi-vessel coronary artery calcification.  Stable asymmetric right apical pulmonary tumefactive scarring, likely the result of remote infection or inflammation. Stable scattered pulmonary nodules within the lungs bilaterally, with a dominant nodule noted within the right upper lobe measuring 5-6 mm. These are stable since remote prior examination of 01/11/2019 and are safely considered benign. Peripheral vascular disease. If there is clinical evidence of lower extremity arterial insufficiency, CT arteriography may be more helpful for further evaluation. Aortic Atherosclerosis (ICD10-I70.0). Electronically Signed   By: Fidela Salisbury MD   On: 02/16/2021 02:07     ____________________________________________   PROCEDURES   Procedure(s) performed (including Critical Care):  .1-3 Lead EKG Interpretation  Date/Time: 02/16/2021 2:30 AM Performed by: Hinda Kehr, MD Authorized by: Hinda Kehr, MD     Interpretation: normal     ECG rate:  60   ECG rate assessment: normal     Rhythm: sinus rhythm     Ectopy: none     Conduction: normal   .Critical Care  Date/Time: 02/16/2021 2:30 AM Performed by: Hinda Kehr, MD Authorized by: Hinda Kehr, MD   Critical care provider statement:    Critical care time (minutes):  30   Critical care time was exclusive of:  Separately billable procedures and treating other patients   Critical care was necessary to treat or prevent imminent or life-threatening deterioration of the following conditions: hypertensive emergency.   Critical care was time spent personally by me on the following activities:  Development of treatment plan with patient or surrogate, discussions with consultants, evaluation of patient's response to treatment, examination of patient, obtaining history from patient or surrogate, ordering and performing treatments and interventions, ordering and review of laboratory studies, ordering and review of radiographic studies, pulse oximetry, re-evaluation of patient's condition and review of old charts   ____________________________________________   [place] / MDM / [place] / ED COURSE  As part of my medical decision making, I reviewed the following data within the electronic MEDICAL RECORD NUMBER History obtained from family, Nursing notes reviewed and incorporated, Labs reviewed , EKG interpreted , Old chart reviewed, Radiograph reviewed , Discussed with admitting physician , and Notes from prior ED visits   Differential diagnosis includes, but is not limited to, hypertensive urgency/emergency, ACS, pneumonia, acid reflux, SBO/ileus, metabolic or electrolyte  abnormality.  The patient is on the cardiac monitor to evaluate for evidence of arrhythmia and/or significant heart rate changes.  I personally reviewed the patient's imaging and agree with the radiologist's interpretation that there are no obvious gross abnormalities on chest x-ray.  Given the location of the pain and tenderness, I will obtain a CT of the chest/abdomen/pelvis without contrast to evaluate for any other acute abnormalities given his age and comorbidities but have a low degree of suspicion of an infectious process.  EKG shows no sign of ischemia.  Vital signs are notable for uncontrolled hypertension in the setting of a previous diagnosis of essential hypertension but no medication therapy.  His labs are notable for a creatinine of 3.7 and normal for him seems to be somewhere between 2.1 and 2.7 although these values are relatively old.  I am concerned about hypertensive emergency with evidence of endorgan dysfunction given the acute on chronic renal failure and chest pain.  Blood pressure management is difficult given that he has a heart rate of around 60; rather than starting on a nitroglycerin drip or giving labetalol which may further drop his heart rate, I will provide 1 inch of nitroglycerin paste and reassess.  Patient  will need admission and the patient and his daughter understand and agree with the plans.     Clinical Course as of 02/16/21 [id number]  Sun Feb 16, 2021  0210 CT CHEST ABDOMEN PELVIS WO CONTRAST Generally reassuring CT of the chest, abdomen, and pelvis without contrast.  No clear indication of an emergent condition.  I am concerned that his chest pain and hyperkalemia are the results of his volume depletion and acute on chronic renal failure in the setting of uncontrolled hypertension.  I will consult the hospitalist for admission for hypertensive urgency/emergency and acute on chronic renal failure with chest pain. [CF]  0225 Discussed case by phone with Dr. Sidney Ace who  will admit.  We discussed blood pressure management and he requested that I give a dose of hydralazine 10 mg IV rather than the nitroglycerin given that the nitroglycerin can occasionally cause of bradycardia as well.  I think this is reasonable and appropriate recommendation even given the national shortage of hydralazine so I will proceed with hydralazine 10 mg IV and I asked the nurse to remove the nitroglycerin paste once she is giving the hydralazine. [CF]    Clinical Course User Index [CF] Hinda Kehr, MD     ____________________________________________  FINAL CLINICAL IMPRESSION(S) / ED DIAGNOSES  Final diagnoses:  Abdominal pain  Chest pain  Hypertensive emergency  Acute renal failure superimposed on stage 4 chronic kidney disease, unspecified acute renal failure type ([place])     MEDICATIONS GIVEN DURING THIS VISIT:  Medications  lactated ringers bolus 1,000 mL (has no administration in time range)  hydrALAZINE (APRESOLINE) injection 5 mg (has no administration in time range)  nitroGLYCERIN (NITROGLYN) 2 % ointment 1 inch (1 inch Topical Given 02/16/21 0209)     ED Discharge Orders     None        Note:  This document was prepared using Dragon voice recognition software and may include unintentional dictation errors.   Hinda Kehr, MD 02/16/21 [phone]

## 2021-02-16 NOTE — Progress Notes (Signed)
PROGRESS NOTE    Max Ellis  [id number] DOB: 1933-12-09 DOA: 02/15/2021 PCP: [place] Pink, MD    Brief Narrative:  Max Ellis is a 87 y.o. male with medical history significant for COPD, hypertension, peripheral vascular disease, stage IV chronic kidney disease, who presented to the emergency room with acute onset of substernal chest pain with associated mild dyspnea and feeling of bloating with no nausea or vomiting or abdominal pain.  He admits to headache and diminished urine output without dysuria or hematuria or flank pain.   Assessment & Plan:   Active Problems:   Hypertensive urgency  #1.  Hypertension emergency. Chest pain secondary to hypertensive emergency. Patient has normal troponin.  Chest pain appears to be due to hypertensive urgency.  Continue as needed blood pressure medicine, also scheduled Norvasc 5 mg p.o. daily.  2.  Acute urinary retention. Benign prostate hypertrophy. Patient has some dysuria, bladder scan showed more than 500 mL, this most likely due to benign prostate hypertrophy. Will anchor Foley, continue Flomax and Proscar, patient be followed by urology after discharge. Send UA and urine culture.  3.  Chronic kidney disease stage IV. Patient does not meet criteria for acute kidney injury.  Acute kidney injury ruled out. Patient renal function appears to be back to baseline.    DVT prophylaxis: Lovenox Code Status: DNR Family Communication: Grandson at bedside. Disposition Plan:    Status is: Inpatient  Remains inpatient appropriate because:Inpatient level of care appropriate due to severity of illness  Dispo: The patient is from: Home              Anticipated d/c is to: Home              Patient currently is not medically stable to d/c.   Difficult to place patient No        I/O last 3 completed shifts: In: 27.4 [I.V.:27.4] Out: -  No intake/output data recorded.     Consultants:  card  Procedures:  None  Antimicrobials: None Subjective: Patient complaining of some difficulty with urination, he only had small amount of urine came out.  Bladder scan showed more than 500 mL of residual. Denies any fever chills pain No longer has nausea vomiting. No headache or dizziness No short of breath or cough.  No chest pain.  Objective: Vitals:   02/16/21 1140 02/16/21 1155 02/16/21 1200 02/16/21 1217  BP: (!) 172/68 (!) 174/73 (!) 166/70 (!) 154/70  Pulse: 87     Resp: 20     Temp: 98.1 F (36.7 C)     TempSrc: Oral     SpO2: 100%     Weight:      Height:        Intake/Output Summary (Last 24 hours) at 02/16/2021 1225 Last data filed at 02/16/2021 0600 Gross per 24 hour  Intake 27.41 ml  Output --  Net 27.41 ml   Filed Weights   02/15/21 1827 02/16/21 0521  Weight: 68 kg 62.6 kg    Examination:  General exam: Appears calm and comfortable  Respiratory system: Clear to auscultation. Respiratory effort normal. Cardiovascular system: S1 & S2 heard, RRR. No JVD, murmurs, rubs, gallops or clicks. No pedal edema. Gastrointestinal system: Abdomen is nondistended, soft and nontender. No organomegaly or masses felt. Normal bowel sounds heard. Central nervous system: Alert and oriented x3.  No focal neurological deficits. Extremities: Symmetric 5 x 5 power. Skin: No rashes, lesions or ulcers Psychiatry: Judgement and insight appear normal. Mood &  affect appropriate.     Data Reviewed: I have personally reviewed following labs and imaging studies  CBC: Recent Labs  Lab 02/15/21 1828 02/16/21 0603  WBC 7.1 7.8  HGB 10.0* 10.4*  HCT 31.5* 32.2*  MCV 102.3* 102.2*  PLT 189 0000000   Basic Metabolic Panel: Recent Labs  Lab 02/15/21 1828 02/16/21 0603  NA 140 139  K 5.4* 5.0  CL 110 109  CO2 25 24  GLUCOSE 108* 89  BUN 42* 39*  CREATININE 3.70* 3.22*  CALCIUM 9.0 8.9   GFR: Estimated Creatinine Clearance: 14.9 mL/min (A) (by C-G formula based on SCr of 3.22 mg/dL  (H)). Liver Function Tests: No results for input(s): AST, ALT, ALKPHOS, BILITOT, PROT, ALBUMIN in the last 168 hours. No results for input(s): LIPASE, AMYLASE in the last 168 hours. No results for input(s): AMMONIA in the last 168 hours. Coagulation Profile: No results for input(s): INR, PROTIME in the last 168 hours. Cardiac Enzymes: No results for input(s): CKTOTAL, CKMB, CKMBINDEX, TROPONINI in the last 168 hours. BNP (last 3 results) No results for input(s): PROBNP in the last 8760 hours. HbA1C: No results for input(s): HGBA1C in the last 72 hours. CBG: No results for input(s): GLUCAP in the last 168 hours. Lipid Profile: No results for input(s): CHOL, HDL, LDLCALC, TRIG, CHOLHDL, LDLDIRECT in the last 72 hours. Thyroid Function Tests: No results for input(s): TSH, T4TOTAL, FREET4, T3FREE, THYROIDAB in the last 72 hours. Anemia Panel: No results for input(s): VITAMINB12, FOLATE, FERRITIN, TIBC, IRON, RETICCTPCT in the last 72 hours. Sepsis Labs: No results for input(s): PROCALCITON, LATICACIDVEN in the last 168 hours.  Recent Results (from the past 240 hour(s))  Resp Panel by RT-PCR (Flu A&B, Covid) Nasopharyngeal Swab     Status: None   Collection Time: 02/16/21  2:56 AM   Specimen: Nasopharyngeal Swab; Nasopharyngeal(NP) swabs in vial transport medium  Result Value Ref Range Status   SARS Coronavirus 2 by RT PCR NEGATIVE NEGATIVE Final    Comment: (NOTE) SARS-CoV-2 target nucleic acids are NOT DETECTED.  The SARS-CoV-2 RNA is generally detectable in upper respiratory specimens during the acute phase of infection. The lowest concentration of SARS-CoV-2 viral copies this assay can detect is 138 copies/mL. A negative result does not preclude SARS-Cov-2 infection and should not be used as the sole basis for treatment or other patient management decisions. A negative result may occur with  improper specimen collection/handling, submission of specimen other than nasopharyngeal  swab, presence of viral mutation(s) within the areas targeted by this assay, and inadequate number of viral copies(<138 copies/mL). A negative result must be combined with clinical observations, patient history, and epidemiological information. The expected result is Negative.  Fact Sheet for Patients:  EntrepreneurPulse.com.au  Fact Sheet for Healthcare Providers:  IncredibleEmployment.be  This test is no t yet approved or cleared by the [place] FDA and  has been authorized for detection and/or diagnosis of SARS-CoV-2 by FDA under an Emergency Use Authorization (EUA). This EUA will remain  in effect (meaning this test can be used) for the duration of the COVID-19 declaration under Section 564(b)(1) of the Act, 21 U.S.C.section 360bbb-3(b)(1), unless the authorization is terminated  or revoked sooner.       Influenza A by PCR NEGATIVE NEGATIVE Final   Influenza B by PCR NEGATIVE NEGATIVE Final    Comment: (NOTE) The Xpert Xpress SARS-CoV-2/FLU/RSV plus assay is intended as an aid in the diagnosis of influenza from Nasopharyngeal swab specimens and should not be used  as a sole basis for treatment. Nasal washings and aspirates are unacceptable for Xpert Xpress SARS-CoV-2/FLU/RSV testing.  Fact Sheet for Patients: EntrepreneurPulse.com.au  Fact Sheet for Healthcare Providers: IncredibleEmployment.be  This test is not yet approved or cleared by the [place] FDA and has been authorized for detection and/or diagnosis of SARS-CoV-2 by FDA under an Emergency Use Authorization (EUA). This EUA will remain in effect (meaning this test can be used) for the duration of the COVID-19 declaration under Section 564(b)(1) of the Act, 21 U.S.C. section 360bbb-3(b)(1), unless the authorization is terminated or revoked.  Performed at [facility], [address] [place], [place] [address]           Radiology Studies: DG Chest 2 View  Result Date: 02/15/2021 CLINICAL DATA:  Chest pain and shortness of breath for several days EXAM: CHEST - 2 VIEW COMPARISON:  01/11/2019, 04/15/2020 FINDINGS: Frontal and lateral views of the chest demonstrate a stable cardiac silhouette. Stable spiculated density at the right apex compatible with scarring based on previous CT imaging. No acute airspace disease, effusion, or pneumothorax. No acute bony abnormalities. IMPRESSION: 1. No acute intrathoracic process. 2. Stable right apical scarring. Electronically Signed   By: Randa Ngo M.D.   On: 02/15/2021 18:50   CT CHEST ABDOMEN PELVIS WO CONTRAST  Result Date: 02/16/2021 CLINICAL DATA:  Chest pain, dyspnea, unspecified abdominal pain EXAM: CT CHEST, ABDOMEN AND PELVIS WITHOUT CONTRAST TECHNIQUE: Multidetector CT imaging of the chest, abdomen and pelvis was performed following the standard protocol without IV contrast. COMPARISON:  04/15/2020 FINDINGS: CT CHEST FINDINGS Cardiovascular: Extensive multi-vessel coronary artery calcification. Global cardiac size within normal limits. No pericardial effusion. Central pulmonary arteries are of normal caliber. Moderate atherosclerotic calcification within the thoracic aorta. No aortic aneurysm. Mediastinum/Nodes: No pathologic thoracic adenopathy. Visualized thyroid is unremarkable. The esophagus appears patulous, but is otherwise unremarkable. Lungs/Pleura: Asymmetric right apical scarring, likely post infectious or post inflammatory in nature, appears stable. 5-6 mm subpleural pulmonary nodule within the right upper lobe, axial image # 78/5, is stable. Additional scattered 2-3 mm pulmonary nodules throughout the lungs bilaterally are also stable. No new focal pulmonary nodules or infiltrates. No pneumothorax or pleural effusion. Central airways are widely patent. Musculoskeletal: Multiple mild anterior wedge deformities of the midthoracic spine with associated  mild thoracic dextroscoliosis is stable since prior examination. No acute bone abnormality. No lytic or blastic bone lesion. CT ABDOMEN PELVIS FINDINGS Hepatobiliary: No focal liver abnormality is seen. No gallstones, gallbladder wall thickening, or biliary dilatation. Pancreas: Unremarkable Spleen: Unremarkable Adrenals/Urinary Tract: The adrenal glands are unremarkable. The kidneys are normal in position. The kidneys are mildly atrophic bilaterally, stable since prior examination. Vascular calcifications are noted within the renal hila bilaterally. No hydronephrosis. No urinary calculi. No perinephric fluid collections are seen. The bladder is unremarkable. Stomach/Bowel: Stomach is within normal limits. Appendix appears normal. No evidence of bowel wall thickening, distention, or inflammatory changes. No free intraperitoneal gas or fluid. Vascular/Lymphatic: Extensive aortoiliac atherosclerotic calcification. Particularly prominent atherosclerotic calcification noted within the lower extremity arterial inflow and visualized outflow. No aortic aneurysm. No pathologic adenopathy within the abdomen and pelvis. Reproductive: Prostate is unremarkable. Other: No abdominal wall hernia.  Rectum unremarkable. Musculoskeletal: Degenerative changes are seen within the lumbar spine. No acute bone abnormality. No lytic or blastic bone lesion identified. IMPRESSION: No acute intrathoracic or intra-abdominal pathology identified. No definite radiographic explanation for the patient's reported symptoms. Extensive multi-vessel coronary artery calcification. Stable asymmetric right apical pulmonary tumefactive scarring, likely the  result of remote infection or inflammation. Stable scattered pulmonary nodules within the lungs bilaterally, with a dominant nodule noted within the right upper lobe measuring 5-6 mm. These are stable since remote prior examination of 01/11/2019 and are safely considered benign. Peripheral vascular  disease. If there is clinical evidence of lower extremity arterial insufficiency, CT arteriography may be more helpful for further evaluation. Aortic Atherosclerosis (ICD10-I70.0). Electronically Signed   By: Fidela Salisbury MD   On: 02/16/2021 02:07        Scheduled Meds:  amLODipine  5 mg Oral Daily   aspirin  81 mg Oral Daily   calcitRIOL  0.25 mcg Oral Daily   cholecalciferol  1,000 Units Oral Daily   citalopram  10 mg Oral Daily   enoxaparin (LOVENOX) injection  30 mg Subcutaneous Q24H   finasteride  5 mg Oral Daily   latanoprost  1 drop Both Eyes QHS   melatonin  5 mg Oral QHS   pantoprazole  40 mg Oral Daily   tamsulosin  0.8 mg Oral Daily   traZODone  150 mg Oral QHS   trimethoprim  50 mg Oral Daily   Continuous Infusions:   LOS: 0 days    Time spent: no charge    Sharen Hones, MD [facility]   To contact the attending provider between 7A-7P or the covering provider during after hours 7P-7A, please log into the web site [email or web address] and access using universal [place] password for that web site. If you do not have the password, please call the hospital operator.  02/16/2021, 12:25 PM

## 2021-02-16 NOTE — Progress Notes (Signed)
PHARMACIST - PHYSICIAN COMMUNICATION  CONCERNING:  Enoxaparin (Lovenox) for DVT Prophylaxis    RECOMMENDATION: Patient was prescribed enoxaprin '40mg'$  q24 hours for VTE prophylaxis.   Filed Weights   02/15/21 1827  Weight: 68 kg (150 lb)    Body mass index is 19.26 kg/m.  Estimated Creatinine Clearance: 14 mL/min (A) (by C-G formula based on SCr of 3.7 mg/dL (H)).  Patient is candidate for enoxaparin '30mg'$  every 24 hours based on CrCl <31m/min or Weight <45kg  DESCRIPTION: [facility]  Patient is now receiving enoxaparin 30 mg every 24 hours   NRenda Rolls PharmD, MBig Horn [facility]7/17/2022 2:59 AM

## 2021-02-16 NOTE — ED Notes (Signed)
This RN to bedside, introduced self to patient and daughter. Pt provided with 2 warm blankets at this time. Call bell within reach of patient at this time.

## 2021-02-16 NOTE — Consult Note (Addendum)
CARDIOLOGY CONSULT NOTE               Patient ID: Max Ellis MRN: [id number] DOB/AGE: 1934-01-23 87 y.o.  Admit date: 02/15/2021 Referring Physician Dr. Eugenie Norrie hospitalist Primary Physician Dr. Maryland Pink primary Primary Cardiologist none Reason for Consultation chest pain hypertensive urgency  HPI: Patient is a 87 year old presents with COPD hypertension peripheral vascular disease chronic renal sufficiency stage IV came to emergency room with substernal chest pain mild dyspnea denies nausea vomiting or abdominal pain.  Patient admits to headaches and diminished urine output.  No dizziness no paresthesia patient describes vague chest discomfort was found to have elevated blood pressure.  Patient has acute on chronic renal sufficiency now creatinine up to 3.7 BUN is also elevated.  Patient's had significant shortness of breath and dyspnea chest discomfort.  Patient with history of GERD type symptoms but is currently pain-free . Review of systems complete and found to be negative unless listed above     Past Medical History:  Diagnosis Date   COPD (chronic obstructive pulmonary disease) (HCC)    Hypertension    Peripheral vascular disease (HCC)    Renal disorder    Stage 4 chronic kidney disease ([place])     No past surgical history on file.  Medications Prior to Admission  Medication Sig Dispense Refill Last Dose   ASPIRIN 81 PO Take 81 mg by mouth daily.   02/15/2021   calcitRIOL (ROCALTROL) 0.25 MCG capsule Take 0.25 mcg by mouth daily.   02/15/2021   cholecalciferol (VITAMIN D3) 25 MCG (1000 UT) tablet Take 1,000 Units by mouth daily.   02/15/2021   citalopram (CELEXA) 10 MG tablet Take 10 mg by mouth daily.   02/15/2021   finasteride (PROSCAR) 5 MG tablet Take 5 mg by mouth daily.  11 02/15/2021   furosemide (LASIX) 40 MG tablet Take 40 mg by mouth daily.   02/15/2021   latanoprost (XALATAN) 0.005 % ophthalmic solution Place 1 drop into both eyes at bedtime.   02/15/2021    omeprazole (PRILOSEC) 20 MG capsule Take 20 mg by mouth daily.   02/15/2021   tamsulosin (FLOMAX) 0.4 MG CAPS capsule Take 0.8 mg by mouth daily.   02/15/2021   traZODone (DESYREL) 50 MG tablet Take 150 mg by mouth at bedtime.   02/15/2021   trimethoprim (TRIMPEX) 100 MG tablet Take 0.5 tablets (50 mg total) by mouth daily. 45 tablet 0 02/15/2021   famotidine (PEPCID) 20 MG tablet Take by mouth. (Patient not taking: No sig reported)   Not Taking   predniSONE (STERAPRED UNI-PAK 21 TAB) 10 MG (21) TBPK tablet Dispense steroid taper pack as directed (Patient not taking: No sig reported) 21 tablet 0 Completed Course   Social History   Socioeconomic History   Marital status: Married    Spouse name: Not on file   Number of children: Not on file   Years of education: Not on file   Highest education level: Not on file  Occupational History   Not on file  Tobacco Use   Smoking status: Every Day    Types: Cigarettes   Smokeless tobacco: Former    Types: Chew  Substance and Sexual Activity   Alcohol use: No   Drug use: No   Sexual activity: Not on file  Other Topics Concern   Not on file  Social History Narrative   Not on file   Social Determinants of Health   Financial Resource Strain: Not on file  Food Insecurity: Not on file  Transportation Needs: Not on file  Physical Activity: Not on file  Stress: Not on file  Social Connections: Not on file  Intimate Partner Violence: Not on file    No family history on file.    Review of systems complete and found to be negative unless listed above      PHYSICAL EXAM  General: Well developed, well nourished, in no acute distress HEENT:  Normocephalic and atramatic Neck:  No JVD.  Lungs: Clear bilaterally to auscultation and percussion. Heart: HRRR . Normal S1 and S2 without gallops or murmurs.  Abdomen: Bowel sounds are positive, abdomen soft and non-tender  Msk:  Back normal, normal gait. Normal strength and tone for  age. Extremities: No clubbing, cyanosis or edema.   Neuro: Alert and oriented X 3. Psych:  Good affect, responds appropriately  Labs:   Lab Results  Component Value Date   WBC 7.8 02/16/2021   HGB 10.4 (L) 02/16/2021   HCT 32.2 (L) 02/16/2021   MCV 102.2 (H) 02/16/2021   PLT 175 02/16/2021    Recent Labs  Lab 02/16/21 0603  NA 139  K 5.0  CL 109  CO2 24  BUN 39*  CREATININE 3.22*  CALCIUM 8.9  GLUCOSE 89   Lab Results  Component Value Date   TROPONINI <0.03 01/11/2019    Lab Results  Component Value Date   CHOL 133 01/20/2013   Lab Results  Component Value Date   HDL 46 01/20/2013   Lab Results  Component Value Date   LDLCALC 75 01/20/2013   Lab Results  Component Value Date   TRIG 62 01/20/2013   No results found for: CHOLHDL No results found for: LDLDIRECT    Radiology: DG Chest 2 View  Result Date: 02/15/2021 CLINICAL DATA:  Chest pain and shortness of breath for several days EXAM: CHEST - 2 VIEW COMPARISON:  01/11/2019, 04/15/2020 FINDINGS: Frontal and lateral views of the chest demonstrate a stable cardiac silhouette. Stable spiculated density at the right apex compatible with scarring based on previous CT imaging. No acute airspace disease, effusion, or pneumothorax. No acute bony abnormalities. IMPRESSION: 1. No acute intrathoracic process. 2. Stable right apical scarring. Electronically Signed   By: Randa Ngo M.D.   On: 02/15/2021 18:50   CT CHEST ABDOMEN PELVIS WO CONTRAST  Result Date: 02/16/2021 CLINICAL DATA:  Chest pain, dyspnea, unspecified abdominal pain EXAM: CT CHEST, ABDOMEN AND PELVIS WITHOUT CONTRAST TECHNIQUE: Multidetector CT imaging of the chest, abdomen and pelvis was performed following the standard protocol without IV contrast. COMPARISON:  04/15/2020 FINDINGS: CT CHEST FINDINGS Cardiovascular: Extensive multi-vessel coronary artery calcification. Global cardiac size within normal limits. No pericardial effusion. Central pulmonary  arteries are of normal caliber. Moderate atherosclerotic calcification within the thoracic aorta. No aortic aneurysm. Mediastinum/Nodes: No pathologic thoracic adenopathy. Visualized thyroid is unremarkable. The esophagus appears patulous, but is otherwise unremarkable. Lungs/Pleura: Asymmetric right apical scarring, likely post infectious or post inflammatory in nature, appears stable. 5-6 mm subpleural pulmonary nodule within the right upper lobe, axial image # 78/5, is stable. Additional scattered 2-3 mm pulmonary nodules throughout the lungs bilaterally are also stable. No new focal pulmonary nodules or infiltrates. No pneumothorax or pleural effusion. Central airways are widely patent. Musculoskeletal: Multiple mild anterior wedge deformities of the midthoracic spine with associated mild thoracic dextroscoliosis is stable since prior examination. No acute bone abnormality. No lytic or blastic bone lesion. CT ABDOMEN PELVIS FINDINGS Hepatobiliary: No focal liver abnormality is seen.  No gallstones, gallbladder wall thickening, or biliary dilatation. Pancreas: Unremarkable Spleen: Unremarkable Adrenals/Urinary Tract: The adrenal glands are unremarkable. The kidneys are normal in position. The kidneys are mildly atrophic bilaterally, stable since prior examination. Vascular calcifications are noted within the renal hila bilaterally. No hydronephrosis. No urinary calculi. No perinephric fluid collections are seen. The bladder is unremarkable. Stomach/Bowel: Stomach is within normal limits. Appendix appears normal. No evidence of bowel wall thickening, distention, or inflammatory changes. No free intraperitoneal gas or fluid. Vascular/Lymphatic: Extensive aortoiliac atherosclerotic calcification. Particularly prominent atherosclerotic calcification noted within the lower extremity arterial inflow and visualized outflow. No aortic aneurysm. No pathologic adenopathy within the abdomen and pelvis. Reproductive: Prostate  is unremarkable. Other: No abdominal wall hernia.  Rectum unremarkable. Musculoskeletal: Degenerative changes are seen within the lumbar spine. No acute bone abnormality. No lytic or blastic bone lesion identified. IMPRESSION: No acute intrathoracic or intra-abdominal pathology identified. No definite radiographic explanation for the patient's reported symptoms. Extensive multi-vessel coronary artery calcification. Stable asymmetric right apical pulmonary tumefactive scarring, likely the result of remote infection or inflammation. Stable scattered pulmonary nodules within the lungs bilaterally, with a dominant nodule noted within the right upper lobe measuring 5-6 mm. These are stable since remote prior examination of 01/11/2019 and are safely considered benign. Peripheral vascular disease. If there is clinical evidence of lower extremity arterial insufficiency, CT arteriography may be more helpful for further evaluation. Aortic Atherosclerosis (ICD10-I70.0). Electronically Signed   By: Fidela Salisbury MD   On: 02/16/2021 02:07    EKG: Normal sinus rhythm nonspecific findings EKG of rate 70  ASSESSMENT AND PLAN:  Chest pain Possible angina  Hypertensive urgency Acute on chronic renal sufficiency GERD COPD PVD . Plan Recommend treatment for hypertensive urgency IV hydralazine resume usual blood pressure medications 2 continue chest pain evaluation including EKGs and troponin to rule out for myocardial infarction 3 acute on chronic renal sufficiency adequate hydration consider follow-up with nephrology 4 GERD recommend omeprazole Protonix consider Pepcid as necessary 5 recommend aspirin sublingual nitroglycerin as necessary morphine for pain 6 referral to vascular as necessary for peripheral vascular disease  7 continue DVT prophylaxis with anticoagulation                                                                                                 Signed: Yolonda Kida MD 02/16/2021,  10:41 AM

## 2021-02-17 ENCOUNTER — Other Ambulatory Visit: Payer: Self-pay

## 2021-02-17 ENCOUNTER — Inpatient Hospital Stay
Admit: 2021-02-17 | Discharge: 2021-02-17 | Disposition: A | Discharge status: Home / Self Care | Payer: Medicare Other | Attending: Internal Medicine | Admitting: Internal Medicine

## 2021-02-17 DIAGNOSIS — I214 Non-ST elevation (NSTEMI) myocardial infarction: Secondary | ICD-10-CM | POA: Diagnosis not present

## 2021-02-17 DIAGNOSIS — R338 Other retention of urine: Secondary | ICD-10-CM | POA: Diagnosis not present

## 2021-02-17 DIAGNOSIS — I160 Hypertensive urgency: Secondary | ICD-10-CM | POA: Diagnosis not present

## 2021-02-17 DIAGNOSIS — N184 Chronic kidney disease, stage 4 (severe): Secondary | ICD-10-CM | POA: Diagnosis not present

## 2021-02-17 LAB — TROPONIN I (HIGH SENSITIVITY)
Troponin I (High Sensitivity): 1076 ng/L (ref <18)
Troponin I (High Sensitivity): 1061 ng/L (ref <18)
Troponin I (High Sensitivity): 966 ng/L (ref <18)

## 2021-02-17 LAB — ECHOCARDIOGRAM COMPLETE
Height: 74 in
S' Lateral: 2.59 cm
Weight: 2208.13 oz

## 2021-02-17 MED ORDER — NICOTINE 21 MG/24HR TD PT24
21.0000 mg | MEDICATED_PATCH | Freq: Every day | TRANSDERMAL | Status: DC
  Administered 2021-02-17 – 2021-02-19 (×3): 21 mg via TRANSDERMAL
  Filled 2021-02-17 (×3): qty 1

## 2021-02-17 MED ORDER — CLOPIDOGREL BISULFATE 75 MG PO TABS
75.0000 mg | ORAL_TABLET | Freq: Every day | ORAL | Status: DC
  Administered 2021-02-17 – 2021-02-19 (×3): 75 mg via ORAL
  Filled 2021-02-17 (×3): qty 1

## 2021-02-17 MED ORDER — ATORVASTATIN CALCIUM 80 MG PO TABS
80.0000 mg | ORAL_TABLET | Freq: Every day | ORAL | Status: DC
  Administered 2021-02-17 – 2021-02-19 (×3): 80 mg via ORAL
  Filled 2021-02-17 (×3): qty 1

## 2021-02-17 MED ORDER — HEPARIN BOLUS VIA INFUSION
2000.0000 [IU] | Freq: Once | INTRAVENOUS | Status: AC
  Administered 2021-02-17: 2000 [IU] via INTRAVENOUS
  Filled 2021-02-17: qty 2000

## 2021-02-17 MED ORDER — METOPROLOL TARTRATE 25 MG PO TABS
25.0000 mg | ORAL_TABLET | Freq: Two times a day (BID) | ORAL | Status: DC
  Administered 2021-02-17 – 2021-02-19 (×4): 25 mg via ORAL
  Filled 2021-02-17 (×4): qty 1

## 2021-02-17 MED ORDER — HEPARIN (PORCINE) 25000 UT/250ML-% IV SOLN
950.0000 [IU]/h | INTRAVENOUS | Status: DC
  Administered 2021-02-17: 750 [IU]/h via INTRAVENOUS
  Administered 2021-02-19: 850 [IU]/h via INTRAVENOUS
  Filled 2021-02-17 (×2): qty 250

## 2021-02-17 NOTE — Evaluation (Signed)
Occupational Therapy Evaluation Patient Details Name: Max Ellis MRN: [id number] DOB: 1934/06/19 Today's Date: 02/17/2021    History of Present Illness Max Ellis is a 87 y.o. male with medical history significant for COPD, hypertension, peripheral vascular disease, stage IV chronic kidney disease, who presented to the emergency room with acute onset of substernal chest pain with associated mild dyspnea and feeling of bloating with no nausea or vomiting or abdominal pain.  He admits to headache and diminished urine output without dysuria or hematuria or flank pain.   Clinical Impression   Pt was seen for OT evaluation this date. Prior to hospital admission, pt was indep with mobility and relatively indep with ADL. Pt/grandson present report family brings over food and helps clean for pt. Pt lives by himself since his spouse passed away ~6 weeks ago. Currently pt demonstrates impairments in balance, activity tolerance, strength, and safety as described below (See OT problem list) which functionally limit his ability to perform ADL/self-care tasks. Pt currently requires Supervision for seated ADL and CGA for ADL transfers and mobility with RW with VC for safety/RW mgt. Pt gets up and moves quickly with tendency to push RW to the side once he reaches the sink in the room. Pt would benefit from skilled OT services to address noted impairments and functional limitations (see below for any additional details) in order to maximize safety and independence while minimizing falls risk and caregiver burden. Upon hospital discharge, recommend SNF to maximize pt safety and return to functional independence during meaningful occupations of daily life in order to facilitate eventual return home safely.     Follow Up Recommendations  SNF    Equipment Recommendations  3 in 1 bedside commode    Recommendations for Other Services       Precautions / Restrictions Precautions Precautions:  Fall Restrictions Weight Bearing Restrictions: No      Mobility Bed Mobility     General bed mobility comments: NT, up in recliner    Transfers Overall transfer level: Needs assistance Equipment used: Rolling walker (2 wheeled) Transfers: Sit to/from Stand Sit to Stand: Min guard         General transfer comment: CGA from recliner to stand    Balance Overall balance assessment: Needs assistance Sitting-balance support: No upper extremity supported;Feet supported Sitting balance-Leahy Scale: Good Sitting balance - Comments: supervision static sitting EOB   Standing balance support: Bilateral upper extremity supported;No upper extremity supported;During functional activity Standing balance-Leahy Scale: Fair Standing balance comment: Pt able to stand at sink without UE support for grooming task but otherwise required BUE support on RW for ADL mobility 2/2 decreased balance                           ADL either performed or assessed with clinical judgement   ADL Overall ADL's : Needs assistance/impaired     Grooming: Wash/dry hands;Wash/dry face;Standing;Min guard Grooming Details (indicate cue type and reason): CGA in front of sink +RW         Upper Body Dressing : Sitting;Supervision/safety;Set up   Lower Body Dressing: Sitting/lateral leans;Set up;Supervision/safety   Toilet Transfer: RW;Min guard           Functional mobility during ADLs: Min guard;Cueing for safety;Rolling walker       Vision Baseline Vision/History: Wears glasses;Glaucoma Wears Glasses: At all times Patient Visual Report: No change from baseline Vision Assessment?: No apparent visual deficits     Perception  Praxis      Pertinent Vitals/Pain Pain Assessment: No/denies pain     Hand Dominance Left   Extremity/Trunk Assessment Upper Extremity Assessment Upper Extremity Assessment: Generalized weakness   Lower Extremity Assessment Lower Extremity Assessment:  Generalized weakness   Cervical / Trunk Assessment Cervical / Trunk Assessment: Kyphotic   Communication Communication Communication: No difficulties   Cognition Arousal/Alertness: Awake/alert Behavior During Therapy: Impulsive Overall Cognitive Status: Within Functional Limits for tasks assessed                                 General Comments: Per pt/grandson, pt appears to be at baseline for cognition, mildly impulsive   General Comments      Exercises     Shoulder Instructions      Home Living Family/patient expects to be discharged to:: Private residence Living Arrangements: Alone Available Help at Discharge: Family;Available PRN/intermittently (grandson, other family) Type of Home: House Home Access: Ramped entrance     Home Layout: One level                          Prior Functioning/Environment Level of Independence: Needs assistance  Gait / Transfers Assistance Needed: ambulates without AD, denies falls in the past 6 months ADL's / Homemaking Assistance Needed: family assists with transportation, meals/groceries, family assists pt into the shower but he bathes himself, family also will "check over" pt's medications   Comments: Per grandson, pt with very impaired vision 2/2 glaucoma, pt wears glasses, wife passed away ~6 weeks ago        OT Problem List: Decreased strength;Decreased activity tolerance;Impaired balance (sitting and/or standing);Decreased safety awareness;Decreased knowledge of use of DME or AE      OT Treatment/Interventions: Self-care/ADL training;Therapeutic exercise;Therapeutic activities;DME and/or AE instruction;Patient/family education;Balance training    OT Goals(Current goals can be found in the care plan section) Acute Rehab OT Goals Patient Stated Goal: get better and go home OT Goal Formulation: With patient/family Time For Goal Achievement: 03/03/21 Potential to Achieve Goals: Good ADL Goals Pt Will  Transfer to Toilet: with supervision;ambulating (LRAD for amb, elevated commode, remote supervision) Additional ADL Goal #1: Pt will perform standing grooming tasks at sink with remote supervision for safety, LRAD for ambulation. Additional ADL Goal #2: Pt will perform UB/LB dressing tasks with supervision for safety, including STS transfers with LRAD for transfers and no LOB.  OT Frequency: Min 2X/week   Barriers to D/C:            Co-evaluation              AM-PAC OT "6 Clicks" Daily Activity     Outcome Measure Help from another person eating meals?: None Help from another person taking care of personal grooming?: A Little Help from another person toileting, which includes using toliet, bedpan, or urinal?: A Little Help from another person bathing (including washing, rinsing, drying)?: A Little Help from another person to put on and taking off regular upper body clothing?: None Help from another person to put on and taking off regular lower body clothing?: A Little 6 Click Score: 20   End of Session Equipment Utilized During Treatment: Rolling walker  Activity Tolerance: Patient tolerated treatment well Patient left: in chair;with call bell/phone within reach;with chair alarm set;with family/visitor present  OT Visit Diagnosis: Other abnormalities of gait and mobility (R26.89);Muscle weakness (generalized) (M62.81)  Time: [id number] OT Time Calculation (min): 11 min Charges:  OT General Charges $OT Visit: 1 Visit OT Evaluation $OT Eval Moderate Complexity: 1 Mod  Hanley Hays, MPH, MS, OTR/L ascom [phone] 02/17/21, 3:47 PM

## 2021-02-17 NOTE — Progress Notes (Signed)
Patient's nephew that spent the day with the patient in his room on 02/16/21 has reportedly tested positive for covid.  MD notified.  Stated covid test may be needed on 02/18/21.

## 2021-02-17 NOTE — Progress Notes (Signed)
*  PRELIMINARY RESULTS* Echocardiogram 2D Echocardiogram has been performed.  Sherrie Sport 02/17/2021, 1:16 PM

## 2021-02-17 NOTE — Progress Notes (Signed)
Troponin 1076, on call notified EKG performed, patient denied chest pain or SOB. Report given to day shift RN

## 2021-02-17 NOTE — Progress Notes (Signed)
   02/17/21 1530  Clinical Encounter Type  Visited With Family  Visit Type Follow-up;Spiritual support;Social support  Referral From Other (Comment) (rounding)  Spiritual Encounters  Spiritual Needs Emotional  Chaplain Burris met with Pt's grandson, Gwyndolyn Saxon, who provided insight into Pt's condition as well as family's needs. Chaplain Deloria Lair has a relationship with family due to recent death of Pt's spouse (six weeks ago). Plan to offer support to Pt but noting the recent exposure to Covid positive nephew so will return with proper PPE.

## 2021-02-17 NOTE — Progress Notes (Signed)
[facility] Cardiology    SUBJECTIVE: Patient complains of persistent shortness of breath dyspnea weakness but denies any significant chest pain no leg swelling denies fever chills or sweats blood pressure is improved.   Vitals:   02/17/21 1400 02/17/21 1500 02/17/21 1548 02/17/21 1600  BP:   (!) 158/56   Pulse:   75   Resp: '16 13 18 11  '$ Temp:   98 F (36.7 C)   TempSrc:   Oral   SpO2:   100%   Weight:      Height:         Intake/Output Summary (Last 24 hours) at 02/17/2021 1747 Last data filed at 02/17/2021 1011 Gross per 24 hour  Intake 120 ml  Output 505 ml  Net -385 ml      PHYSICAL EXAM  General: Well developed, well nourished, in no acute distress HEENT:  Normocephalic and atramatic Neck:  No JVD.  Lungs: Clear bilaterally to auscultation and percussion. Heart: HRRR . Normal S1 and S2 without gallops or murmurs.  Abdomen: Bowel sounds are positive, abdomen soft and non-tender  Msk:  Back normal, normal gait. Normal strength and tone for age. Extremities: No clubbing, cyanosis or edema.   Neuro: Alert and oriented X 3. Psych:  Good affect, responds appropriately   LABS: Basic Metabolic Panel: Recent Labs    02/15/21 1828 02/16/21 0603  NA 140 139  K 5.4* 5.0  CL 110 109  CO2 25 24  GLUCOSE 108* 89  BUN 42* 39*  CREATININE 3.70* 3.22*  CALCIUM 9.0 8.9   Liver Function Tests: No results for input(s): AST, ALT, ALKPHOS, BILITOT, PROT, ALBUMIN in the last 72 hours. No results for input(s): LIPASE, AMYLASE in the last 72 hours. CBC: Recent Labs    02/15/21 1828 02/16/21 0603  WBC 7.1 7.8  HGB 10.0* 10.4*  HCT 31.5* 32.2*  MCV 102.3* 102.2*  PLT 189 175   Cardiac Enzymes: No results for input(s): CKTOTAL, CKMB, CKMBINDEX, TROPONINI in the last 72 hours. BNP: Invalid input(s): POCBNP D-Dimer: No results for input(s): DDIMER in the last 72 hours. Hemoglobin A1C: No results for input(s): HGBA1C in the last 72 hours. Fasting Lipid Panel: No results  for input(s): CHOL, HDL, LDLCALC, TRIG, CHOLHDL, LDLDIRECT in the last 72 hours. Thyroid Function Tests: No results for input(s): TSH, T4TOTAL, T3FREE, THYROIDAB in the last 72 hours.  Invalid input(s): FREET3 Anemia Panel: No results for input(s): VITAMINB12, FOLATE, FERRITIN, TIBC, IRON, RETICCTPCT in the last 72 hours.  DG Chest 2 View  Result Date: 02/15/2021 CLINICAL DATA:  Chest pain and shortness of breath for several days EXAM: CHEST - 2 VIEW COMPARISON:  01/11/2019, 04/15/2020 FINDINGS: Frontal and lateral views of the chest demonstrate a stable cardiac silhouette. Stable spiculated density at the right apex compatible with scarring based on previous CT imaging. No acute airspace disease, effusion, or pneumothorax. No acute bony abnormalities. IMPRESSION: 1. No acute intrathoracic process. 2. Stable right apical scarring. Electronically Signed   By: Randa Ngo M.D.   On: 02/15/2021 18:50   ECHOCARDIOGRAM COMPLETE  Result Date: 02/17/2021    ECHOCARDIOGRAM REPORT   Patient Name:   Max Ellis Date of Exam: 02/17/2021 Medical Rec #:  [id number]     Height:       74.0 in Accession #:    [id number]    Weight:       138.0 lb Date of Birth:  [birth year]     BSA:  1.856 m Patient Age:    87 years      BP:           106/47 mmHg Patient Gender: M             HR:           75 bpm. Exam Location:  [facility] Procedure: 2D Echo, Cardiac Doppler and Color Doppler Indications:     Elevated troponin  History:         Patient has no prior history of Echocardiogram examinations.                  COPD; Risk Factors:Hypertension.  Sonographer:     Sherrie Sport RDCS (AE) Referring Phys:  [id number] [facility] D Bayley Hurn Diagnosing Phys: Serafina Royals MD  Sonographer Comments: Technically challenging study due to limited acoustic windows, no apical window and suboptimal parasternal window. Image acquisition challenging due to patient body habitus and Image acquisition challenging due to COPD. IMPRESSIONS  1. Left  ventricular ejection fraction, by estimation, is 60 to 65%. The left ventricle has normal function. The left ventricle has no regional wall motion abnormalities. Left ventricular diastolic parameters were normal.  2. Right ventricular systolic function is normal. The right ventricular size is normal.  3. The mitral valve is normal in structure. Mild mitral valve regurgitation.  4. The aortic valve is normal in structure. Aortic valve regurgitation is trivial. FINDINGS  Left Ventricle: Left ventricular ejection fraction, by estimation, is 60 to 65%. The left ventricle has normal function. The left ventricle has no regional wall motion abnormalities. The left ventricular internal cavity size was normal in size. There is  no left ventricular hypertrophy. Left ventricular diastolic parameters were normal. Right Ventricle: The right ventricular size is normal. No increase in right ventricular wall thickness. Right ventricular systolic function is normal. Left Atrium: Left atrial size was normal in size. Right Atrium: Right atrial size was normal in size. Pericardium: There is no evidence of pericardial effusion. Mitral Valve: The mitral valve is normal in structure. Mild mitral valve regurgitation. Tricuspid Valve: The tricuspid valve is normal in structure. Tricuspid valve regurgitation is mild. Aortic Valve: The aortic valve is normal in structure. Aortic valve regurgitation is trivial. Pulmonic Valve: The pulmonic valve was not well visualized. Pulmonic valve regurgitation is not visualized. Aorta: The aortic root and ascending aorta are structurally normal, with no evidence of dilitation. IAS/Shunts: No atrial level shunt detected by color flow Doppler.  LEFT VENTRICLE PLAX 2D LVIDd:         3.83 cm LVIDs:         2.59 cm LV PW:         1.35 cm LV IVS:        1.03 cm LVOT diam:     2.10 cm LVOT Area:     3.46 cm  LEFT ATRIUM         Index LA diam:    3.80 cm 2.05 cm/m                        PULMONIC VALVE AORTA                  PV Vmax:        1.12 m/s Ao Root diam: 3.50 cm PV Peak grad:   5.0 mmHg                       RVOT Peak  grad: 9 mmHg  TRICUSPID VALVE TR Peak grad:   7.2 mmHg TR Vmax:        134.00 cm/s  SHUNTS Systemic Diam: 2.10 cm Serafina Royals MD Electronically signed by Serafina Royals MD Signature Date/Time: 02/17/2021/1:27:15 PM    Final    CT CHEST ABDOMEN PELVIS WO CONTRAST  Result Date: 02/16/2021 CLINICAL DATA:  Chest pain, dyspnea, unspecified abdominal pain EXAM: CT CHEST, ABDOMEN AND PELVIS WITHOUT CONTRAST TECHNIQUE: Multidetector CT imaging of the chest, abdomen and pelvis was performed following the standard protocol without IV contrast. COMPARISON:  04/15/2020 FINDINGS: CT CHEST FINDINGS Cardiovascular: Extensive multi-vessel coronary artery calcification. Global cardiac size within normal limits. No pericardial effusion. Central pulmonary arteries are of normal caliber. Moderate atherosclerotic calcification within the thoracic aorta. No aortic aneurysm. Mediastinum/Nodes: No pathologic thoracic adenopathy. Visualized thyroid is unremarkable. The esophagus appears patulous, but is otherwise unremarkable. Lungs/Pleura: Asymmetric right apical scarring, likely post infectious or post inflammatory in nature, appears stable. 5-6 mm subpleural pulmonary nodule within the right upper lobe, axial image # 78/5, is stable. Additional scattered 2-3 mm pulmonary nodules throughout the lungs bilaterally are also stable. No new focal pulmonary nodules or infiltrates. No pneumothorax or pleural effusion. Central airways are widely patent. Musculoskeletal: Multiple mild anterior wedge deformities of the midthoracic spine with associated mild thoracic dextroscoliosis is stable since prior examination. No acute bone abnormality. No lytic or blastic bone lesion. CT ABDOMEN PELVIS FINDINGS Hepatobiliary: No focal liver abnormality is seen. No gallstones, gallbladder wall thickening, or biliary dilatation. Pancreas:  Unremarkable Spleen: Unremarkable Adrenals/Urinary Tract: The adrenal glands are unremarkable. The kidneys are normal in position. The kidneys are mildly atrophic bilaterally, stable since prior examination. Vascular calcifications are noted within the renal hila bilaterally. No hydronephrosis. No urinary calculi. No perinephric fluid collections are seen. The bladder is unremarkable. Stomach/Bowel: Stomach is within normal limits. Appendix appears normal. No evidence of bowel wall thickening, distention, or inflammatory changes. No free intraperitoneal gas or fluid. Vascular/Lymphatic: Extensive aortoiliac atherosclerotic calcification. Particularly prominent atherosclerotic calcification noted within the lower extremity arterial inflow and visualized outflow. No aortic aneurysm. No pathologic adenopathy within the abdomen and pelvis. Reproductive: Prostate is unremarkable. Other: No abdominal wall hernia.  Rectum unremarkable. Musculoskeletal: Degenerative changes are seen within the lumbar spine. No acute bone abnormality. No lytic or blastic bone lesion identified. IMPRESSION: No acute intrathoracic or intra-abdominal pathology identified. No definite radiographic explanation for the patient's reported symptoms. Extensive multi-vessel coronary artery calcification. Stable asymmetric right apical pulmonary tumefactive scarring, likely the result of remote infection or inflammation. Stable scattered pulmonary nodules within the lungs bilaterally, with a dominant nodule noted within the right upper lobe measuring 5-6 mm. These are stable since remote prior examination of 01/11/2019 and are safely considered benign. Peripheral vascular disease. If there is clinical evidence of lower extremity arterial insufficiency, CT arteriography may be more helpful for further evaluation. Aortic Atherosclerosis (ICD10-I70.0). Electronically Signed   By: Fidela Salisbury MD   On: 02/16/2021 02:07     Echo preserved overall left  ventricular function at 60 to 65%  TELEMETRY: Normal sinus rhythm rate of around 60 nonspecific findings:  ASSESSMENT AND PLAN:  Active Problems:   Essential hypertension   Hypertensive urgency   Acute urinary retention   Chronic kidney disease, stage 4 (severe) (HCC)   NSTEMI (non-ST elevated myocardial infarction) (HCC) GERD COPD Peripheral vascular disease  . Plan Elevated troponin peaked at around 1000 suggestive of a non-STEMI recommend heparin IV for another 48 to  72 hours Acute on chronic renal sufficiency recommend adequate hydration consider nephrology input GERD omeprazole or Protonix consider Pepcid as well Recommend aspirin therapy nitroglycerin as well Recommend vascular input for peripheral vascular disease IV heparin therapy for non-STEMI ACS Consider adding Plavix aspirin beta-blocker statin therapy Recommend statin therapy for hyperlipidemia management     Yolonda Kida, MD 02/17/2021 5:47 PM

## 2021-02-17 NOTE — Progress Notes (Signed)
Date and time results received: 02/17/21 0655 (use smartphrase ".now" to insert current time)  Test: Troponin  Critical Value: 1076  Name of Provider Notified: Hal Hope  Orders Received? Or Actions Taken?:  none

## 2021-02-17 NOTE — Consult Note (Addendum)
ANTICOAGULATION CONSULT NOTE - Initial Consult  Pharmacy Consult for heparin infusion Indication: chest pain/ACS  No Known Allergies  Patient Measurements: Height: '6\' 2"'$  (188 cm) Weight: 62.6 kg (138 lb 0.1 oz) IBW/kg (Calculated) : 82.2 Heparin Dosing Weight: 62.6 kg  Vital Signs: Temp: 98 F (36.7 C) (07/18 1548) Temp Source: Oral (07/18 1548) BP: 158/56 (07/18 1548) Pulse Rate: 75 (07/18 1548)  Labs: Recent Labs    02/15/21 1828 02/15/21 2106 02/16/21 0603 02/17/21 0545 02/17/21 0842 02/17/21 1049  HGB 10.0*  --  10.4*  --   --   --   HCT 31.5*  --  32.2*  --   --   --   PLT 189  --  175  --   --   --   CREATININE 3.70*  --  3.22*  --   --   --   TROPONINIHS 12   < > 15 1,076* 1,061* 966*   < > = values in this interval not displayed.    Estimated Creatinine Clearance: 14.9 mL/min (A) (by C-G formula based on SCr of 3.22 mg/dL (H)).   Medical History: Past Medical History:  Diagnosis Date   COPD (chronic obstructive pulmonary disease) (HCC)    Hypertension    Peripheral vascular disease (HCC)    Renal disorder    Stage 4 chronic kidney disease (HCC)     Medications:  Enoxaparin 30 mg (ppx) given 7/18 @ 0900   Assessment: 87 y/o male complains of persistent shortness of breath dyspnea weakness. Troponin elevated 15> 1076. Pharmacy has been consulted for initiation and management of heparin infusion for ACS.  Goal of Therapy:  Heparin level 0.3-0.7 units/ml Monitor platelets by anticoagulation protocol: Yes   Plan:  Due to recent enoxaparin dose this AM will give reduced 2000 units bolus x 1 Start heparin infusion at 750 units/hr Check anti-Xa level in 8 hours and daily while on heparin, enoxaparin effect may still be seen with this level Continue to monitor H&H and platelets Plan to continue 48-72 hours per cardiology   Dorothe Pea, PharmD, BCPS Clinical Pharmacist   02/17/2021,6:00 PM

## 2021-02-17 NOTE — Evaluation (Signed)
Physical Therapy Evaluation Patient Details Name: Max Ellis MRN: [id number] DOB: Oct 15, 1933 Today's Date: 02/17/2021   History of Present Illness  Max Ellis is a 87 y.o. male with medical history significant for COPD, hypertension, peripheral vascular disease, stage IV chronic kidney disease, who presented to the emergency room with acute onset of substernal chest pain with associated mild dyspnea and feeling of bloating with no nausea or vomiting or abdominal pain.  He admits to headache and diminished urine output without dysuria or hematuria or flank pain.   Clinical Impression  Attending cleared pt for participation in PT evaluation via secure chat. Pt received in bed with grandson present for session & assisting with providing PLOF & home set up information. Pt is able to complete bed mobility without physical assistance but requires min/mod assist for sit>stand transfers & pt demonstrates strong posterior lean & support on EOB from BLE. Pt is unable to march in place without UE support so provided pt with RW & pt ambulates around bed to recliner with mod assist with very poor awareness, frequently hitting objects & impaired use of AD even with PT attempting to educate/cue pt. Pt very impulsive with all activity and a high fall risk although pt's grandson reports pt's gait is baseline (and pt ambulates without AD at home). PT thoroughly educated pt & grandson of recommendation of STR upon d/c to maximize independence with mobility & reduce fall risk prior to return home. Will continue to follow pt acutely to progress mobility, balance, & safety awareness as able.   No c/o adverse symptoms during session. Reviewed use of call bell with pt.    Follow Up Recommendations SNF;Supervision/Assistance - 24 hour    Equipment Recommendations  Rolling walker with 5" wheels;3in1 (PT)    Recommendations for Other Services       Precautions / Restrictions Precautions Precautions:  Fall Restrictions Weight Bearing Restrictions: No      Mobility  Bed Mobility Overal bed mobility: Needs Assistance Bed Mobility: Supine to Sit     Supine to sit: Supervision;HOB elevated     General bed mobility comments: bed rails    Transfers Overall transfer level: Needs assistance Equipment used: Rolling walker (2 wheeled);None Transfers: Sit to/from Stand Sit to Stand: Min assist;Mod assist         General transfer comment: cuing for safe hand placement, posterior lean on bed with BLE  Ambulation/Gait Ambulation/Gait assistance: Mod assist Gait Distance (Feet): 8 Feet Assistive device: Rolling walker (2 wheeled) Gait Pattern/deviations: Decreased step length - left;Decreased step length - right;Trunk flexed        Stairs            Wheelchair Mobility    Modified Rankin (Stroke Patients Only)       Balance Overall balance assessment: Needs assistance Sitting-balance support: No upper extremity supported;Feet supported Sitting balance-Leahy Scale: Good Sitting balance - Comments: supervision static sitting EOB   Standing balance support: Bilateral upper extremity supported;No upper extremity supported;During functional activity Standing balance-Leahy Scale: Poor Standing balance comment: posterior lean                             Pertinent Vitals/Pain Pain Assessment: No/denies pain    Home Living Family/patient expects to be discharged to:: Private residence Living Arrangements: Alone Available Help at Discharge: Family;Available PRN/intermittently Type of Home: House Home Access: Ramped entrance     Home Layout: One level  Prior Function Level of Independence: Needs assistance   Gait / Transfers Assistance Needed: ambulates without AD, denies falls in the past 6 months  ADL's / Homemaking Assistance Needed: family assists with transportation, meals/groceries, family assists pt into the shower but he bathes  himself  Comments: Per grandson, pt with very impaired vision 2/2 glaucoma, pt wears glasses, wife passed away ~6 weeks ago     Hand Dominance        Extremity/Trunk Assessment   Upper Extremity Assessment Upper Extremity Assessment: Generalized weakness    Lower Extremity Assessment Lower Extremity Assessment: Generalized weakness    Cervical / Trunk Assessment Cervical / Trunk Assessment: Kyphotic  Communication   Communication: No difficulties  Cognition Arousal/Alertness: Awake/alert Behavior During Therapy: Impulsive Overall Cognitive Status:  (Appears to be at baseline, but pt AxO to self, location, situation, and month but reports year is 2001 (PT educated him on current year of 2022))                                        General Comments General comments (skin integrity, edema, etc.): HR 88-95 bpm, BP in LUE sitting EOB at beginning of session 120/65 mmHg (MAP 81), no SOB or c/o adverse symptoms during session    Exercises     Assessment/Plan    PT Assessment Patient needs continued PT services  PT Problem List Decreased strength;Decreased mobility;Decreased safety awareness;Decreased activity tolerance;Cardiopulmonary status limiting activity;Decreased knowledge of use of DME;Decreased balance       PT Treatment Interventions DME instruction;Therapeutic activities;Cognitive remediation;Modalities;Gait training;Therapeutic exercise;Patient/family education;Stair training;Balance training;Functional mobility training;Neuromuscular re-education;Manual techniques    PT Goals (Current goals can be found in the Care Plan section)  Acute Rehab PT Goals Patient Stated Goal: get better PT Goal Formulation: With patient/family Time For Goal Achievement: 03/03/21 Potential to Achieve Goals: Good    Frequency Min 2X/week   Barriers to discharge Decreased caregiver support unsure if pt has 24 hr supervision/assist at d/c    Co-evaluation                AM-PAC PT "6 Clicks" Mobility  Outcome Measure   Help needed moving from lying on your back to sitting on the side of a flat bed without using bedrails?: A Little Help needed moving to and from a bed to a chair (including a wheelchair)?: A Lot Help needed standing up from a chair using your arms (e.g., wheelchair or bedside chair)?: A Lot Help needed to walk in hospital room?: A Lot Help needed climbing 3-5 steps with a railing? : Total 6 Click Score: 10    End of Session Equipment Utilized During Treatment: Gait belt Activity Tolerance: Patient tolerated treatment well Patient left: in chair;with chair alarm set;with family/visitor present;with call bell/phone within reach Nurse Communication: Mobility status PT Visit Diagnosis: Muscle weakness (generalized) (M62.81);Difficulty in walking, not elsewhere classified (R26.2);Unsteadiness on feet (R26.81)    Time: [id number] PT Time Calculation (min) (ACUTE ONLY): 23 min   Charges:   PT Evaluation $PT Eval High Complexity: 1 High PT Treatments $Therapeutic Activity: 8-22 mins        Lavone Nian, PT, DPT 02/17/21, 3:17 PM   Waunita Schooner 02/17/2021, 3:14 PM

## 2021-02-17 NOTE — Progress Notes (Signed)
PROGRESS NOTE    Max Ellis  [id number] DOB: Sep 16, 1933 DOA: 02/15/2021 PCP: Maryland Pink, MD   Chief complaint.  Chest pain. Brief Narrative:  Max Ellis is a 87 y.o. male with medical history significant for COPD, hypertension, peripheral vascular disease, stage IV chronic kidney disease, who presented to the emergency room with acute onset of substernal chest pain with associated mild dyspnea and feeling of bloating with no nausea or vomiting or abdominal pain.  Initial troponin was normal, increased to 1076 7/18 am.   Assessment & Plan:   Active Problems:   Essential hypertension   Hypertensive urgency   Acute urinary retention   Chronic kidney disease, stage 4 (severe) (HCC)  #1.  Non-STEMI. Hypertensive emergency. Reviewed EKG performed this morning, did not show any ST elevation.  Patient had a significant elevation troponin, consistent with non-STEMI. Dr. Clayborn Bigness is consulted. I will start beta-blocker metoprolol 25 mg twice a day, continue aspirin, add Plavix 75 mg daily.  Add Lipitor 80 mg daily. Echocardiogram has been performed yesterday, showed ejection fraction 60 to 65%, no regional abnormality.  2.  Acute urinary retention. Benign prostate hypertrophy. Foley catheter anchored, UA does not support a UTI.  Patient will be followed by urology as outpatient.  #3.  Chronic kidney disease stage IV. Renal function still stable.    DVT prophylaxis: Lovenox Code Status: DNR Family Communication: Grandson updated at bedside. Disposition Plan:    Status is: Inpatient  Remains inpatient appropriate because:Ongoing diagnostic testing needed not appropriate for outpatient work up and Inpatient level of care appropriate due to severity of illness  Dispo: The patient is from: Home              Anticipated d/c is to: Home              Patient currently is not medically stable to d/c.   Difficult to place patient No        I/O last 3 completed  shifts: In: 27.4 [I.V.:27.4] Out: 980 [Urine:980] Total I/O In: 120 [P.O.:120] Out: 125 [Urine:125]     Consultants:  Card  Procedures: None  Antimicrobials: None   Subjective: Patient doing well today, denies any chest pain short of breath. No abdominal pain or nausea vomiting. Foley cath in place, no dysuria. No fever or chills. No headache or dizziness. No abdominal pain or nausea vomiting.  Objective: Vitals:   02/16/21 1955 02/17/21 0433 02/17/21 0854 02/17/21 1135  BP: (!) 142/66 (!) 114/44 (!) 142/62 (!) 106/47  Pulse: 87 72 78 75  Resp: '18 16 20 18  '$ Temp: 98.1 F (36.7 C) 97.8 F (36.6 C) 98.1 F (36.7 C) 98.1 F (36.7 C)  TempSrc:  Axillary Oral   SpO2: 100% 99% 100% 100%  Weight:      Height:        Intake/Output Summary (Last 24 hours) at 02/17/2021 1350 Last data filed at 02/17/2021 1011 Gross per 24 hour  Intake 120 ml  Output 505 ml  Net -385 ml   Filed Weights   02/15/21 1827 02/16/21 0521  Weight: 68 kg 62.6 kg    Examination:  General exam: Appears calm and comfortable  Respiratory system: Clear to auscultation. Respiratory effort normal. Cardiovascular system: S1 & S2 heard, RRR. No JVD, murmurs, rubs, gallops or clicks. No pedal edema. Gastrointestinal system: Abdomen is nondistended, soft and nontender. No organomegaly or masses felt. Normal bowel sounds heard. Central nervous system: Alert and oriented x3. No focal neurological deficits.  Extremities: Symmetric 5 x 5 power. Skin: No rashes, lesions or ulcers Psychiatry: Mood & affect appropriate.     Data Reviewed: I have personally reviewed following labs and imaging studies  CBC: Recent Labs  Lab 02/15/21 1828 02/16/21 0603  WBC 7.1 7.8  HGB 10.0* 10.4*  HCT 31.5* 32.2*  MCV 102.3* 102.2*  PLT 189 0000000   Basic Metabolic Panel: Recent Labs  Lab 02/15/21 1828 02/16/21 0603  NA 140 139  K 5.4* 5.0  CL 110 109  CO2 25 24  GLUCOSE 108* 89  BUN 42* 39*  CREATININE  3.70* 3.22*  CALCIUM 9.0 8.9   GFR: Estimated Creatinine Clearance: 14.9 mL/min (A) (by C-G formula based on SCr of 3.22 mg/dL (H)). Liver Function Tests: No results for input(s): AST, ALT, ALKPHOS, BILITOT, PROT, ALBUMIN in the last 168 hours. No results for input(s): LIPASE, AMYLASE in the last 168 hours. No results for input(s): AMMONIA in the last 168 hours. Coagulation Profile: No results for input(s): INR, PROTIME in the last 168 hours. Cardiac Enzymes: No results for input(s): CKTOTAL, CKMB, CKMBINDEX, TROPONINI in the last 168 hours. BNP (last 3 results) No results for input(s): PROBNP in the last 8760 hours. HbA1C: No results for input(s): HGBA1C in the last 72 hours. CBG: No results for input(s): GLUCAP in the last 168 hours. Lipid Profile: No results for input(s): CHOL, HDL, LDLCALC, TRIG, CHOLHDL, LDLDIRECT in the last 72 hours. Thyroid Function Tests: No results for input(s): TSH, T4TOTAL, FREET4, T3FREE, THYROIDAB in the last 72 hours. Anemia Panel: No results for input(s): VITAMINB12, FOLATE, FERRITIN, TIBC, IRON, RETICCTPCT in the last 72 hours. Sepsis Labs: No results for input(s): PROCALCITON, LATICACIDVEN in the last 168 hours.  Recent Results (from the past 240 hour(s))  Resp Panel by RT-PCR (Flu A&B, Covid) Nasopharyngeal Swab     Status: None   Collection Time: 02/16/21  2:56 AM   Specimen: Nasopharyngeal Swab; Nasopharyngeal(NP) swabs in vial transport medium  Result Value Ref Range Status   SARS Coronavirus 2 by RT PCR NEGATIVE NEGATIVE Final    Comment: (NOTE) SARS-CoV-2 target nucleic acids are NOT DETECTED.  The SARS-CoV-2 RNA is generally detectable in upper respiratory specimens during the acute phase of infection. The lowest concentration of SARS-CoV-2 viral copies this assay can detect is 138 copies/mL. A negative result does not preclude SARS-Cov-2 infection and should not be used as the sole basis for treatment or other patient management  decisions. A negative result may occur with  improper specimen collection/handling, submission of specimen other than nasopharyngeal swab, presence of viral mutation(s) within the areas targeted by this assay, and inadequate number of viral copies(<138 copies/mL). A negative result must be combined with clinical observations, patient history, and epidemiological information. The expected result is Negative.  Fact Sheet for Patients:  EntrepreneurPulse.com.au  Fact Sheet for Healthcare Providers:  IncredibleEmployment.be  This test is no t yet approved or cleared by the [place] FDA and  has been authorized for detection and/or diagnosis of SARS-CoV-2 by FDA under an Emergency Use Authorization (EUA). This EUA will remain  in effect (meaning this test can be used) for the duration of the COVID-19 declaration under Section 564(b)(1) of the Act, 21 U.S.C.section 360bbb-3(b)(1), unless the authorization is terminated  or revoked sooner.       Influenza A by PCR NEGATIVE NEGATIVE Final   Influenza B by PCR NEGATIVE NEGATIVE Final    Comment: (NOTE) The Xpert Xpress SARS-CoV-2/FLU/RSV plus assay is intended as an  aid in the diagnosis of influenza from Nasopharyngeal swab specimens and should not be used as a sole basis for treatment. Nasal washings and aspirates are unacceptable for Xpert Xpress SARS-CoV-2/FLU/RSV testing.  Fact Sheet for Patients: EntrepreneurPulse.com.au  Fact Sheet for Healthcare Providers: IncredibleEmployment.be  This test is not yet approved or cleared by the [place] FDA and has been authorized for detection and/or diagnosis of SARS-CoV-2 by FDA under an Emergency Use Authorization (EUA). This EUA will remain in effect (meaning this test can be used) for the duration of the COVID-19 declaration under Section 564(b)(1) of the Act, 21 U.S.C. section 360bbb-3(b)(1), unless the  authorization is terminated or revoked.  Performed at [facility], [address] [place], [place] [address]   Urine Culture     Status: None (Preliminary result)   Collection Time: 02/16/21 12:46 PM   Specimen: Urine, Clean Catch  Result Value Ref Range Status   Specimen Description   Final    URINE, CLEAN CATCH Performed at [facility], [address], [place], [place] [address]    Special Requests   Final    NONE Performed at [facility], [address] [place], [place] [address]    Culture   Final    CULTURE REINCUBATED FOR BETTER GROWTH Performed at [facility], [place] [address], [place], [place] [address]    Report Status PENDING  Incomplete         Radiology Studies: DG Chest 2 View  Result Date: 02/15/2021 CLINICAL DATA:  Chest pain and shortness of breath for several days EXAM: CHEST - 2 VIEW COMPARISON:  01/11/2019, 04/15/2020 FINDINGS: Frontal and lateral views of the chest demonstrate a stable cardiac silhouette. Stable spiculated density at the right apex compatible with scarring based on previous CT imaging. No acute airspace disease, effusion, or pneumothorax. No acute bony abnormalities. IMPRESSION: 1. No acute intrathoracic process. 2. Stable right apical scarring. Electronically Signed   By: Randa Ngo M.D.   On: 02/15/2021 18:50   ECHOCARDIOGRAM COMPLETE  Result Date: 02/17/2021    ECHOCARDIOGRAM REPORT   Patient Name:   GRAYCIN DELROSSO Date of Exam: 02/17/2021 Medical Rec #:  [id number]     Height:       74.0 in Accession #:    [id number]    Weight:       138.0 lb Date of Birth:  Feb 18, 1934     BSA:          1.856 m Patient Age:    39 years      BP:           106/47 mmHg Patient Gender: M             HR:           75 bpm. Exam Location:  [facility] Procedure: 2D Echo, Cardiac Doppler and Color Doppler Indications:     Elevated troponin  History:         Patient has no prior history of Echocardiogram examinations.                   COPD; Risk Factors:Hypertension.  Sonographer:     Sherrie Sport RDCS (AE) Referring Phys:  [id number] [facility] CALLWOOD Diagnosing Phys: Serafina Royals MD  Sonographer Comments: Technically challenging study due to limited acoustic windows, no apical window and suboptimal parasternal window. Image acquisition challenging due to patient body habitus and Image acquisition challenging due to COPD. IMPRESSIONS  1. Left ventricular  ejection fraction, by estimation, is 60 to 65%. The left ventricle has normal function. The left ventricle has no regional wall motion abnormalities. Left ventricular diastolic parameters were normal.  2. Right ventricular systolic function is normal. The right ventricular size is normal.  3. The mitral valve is normal in structure. Mild mitral valve regurgitation.  4. The aortic valve is normal in structure. Aortic valve regurgitation is trivial. FINDINGS  Left Ventricle: Left ventricular ejection fraction, by estimation, is 60 to 65%. The left ventricle has normal function. The left ventricle has no regional wall motion abnormalities. The left ventricular internal cavity size was normal in size. There is  no left ventricular hypertrophy. Left ventricular diastolic parameters were normal. Right Ventricle: The right ventricular size is normal. No increase in right ventricular wall thickness. Right ventricular systolic function is normal. Left Atrium: Left atrial size was normal in size. Right Atrium: Right atrial size was normal in size. Pericardium: There is no evidence of pericardial effusion. Mitral Valve: The mitral valve is normal in structure. Mild mitral valve regurgitation. Tricuspid Valve: The tricuspid valve is normal in structure. Tricuspid valve regurgitation is mild. Aortic Valve: The aortic valve is normal in structure. Aortic valve regurgitation is trivial. Pulmonic Valve: The pulmonic valve was not well visualized. Pulmonic valve regurgitation is not visualized. Aorta: The aortic  root and ascending aorta are structurally normal, with no evidence of dilitation. IAS/Shunts: No atrial level shunt detected by color flow Doppler.  LEFT VENTRICLE PLAX 2D LVIDd:         3.83 cm LVIDs:         2.59 cm LV PW:         1.35 cm LV IVS:        1.03 cm LVOT diam:     2.10 cm LVOT Area:     3.46 cm  LEFT ATRIUM         Index LA diam:    3.80 cm 2.05 cm/m                        PULMONIC VALVE AORTA                 PV Vmax:        1.12 m/s Ao Root diam: 3.50 cm PV Peak grad:   5.0 mmHg                       RVOT Peak grad: 9 mmHg  TRICUSPID VALVE TR Peak grad:   7.2 mmHg TR Vmax:        134.00 cm/s  SHUNTS Systemic Diam: 2.10 cm Serafina Royals MD Electronically signed by Serafina Royals MD Signature Date/Time: 02/17/2021/1:27:15 PM    Final    CT CHEST ABDOMEN PELVIS WO CONTRAST  Result Date: 02/16/2021 CLINICAL DATA:  Chest pain, dyspnea, unspecified abdominal pain EXAM: CT CHEST, ABDOMEN AND PELVIS WITHOUT CONTRAST TECHNIQUE: Multidetector CT imaging of the chest, abdomen and pelvis was performed following the standard protocol without IV contrast. COMPARISON:  04/15/2020 FINDINGS: CT CHEST FINDINGS Cardiovascular: Extensive multi-vessel coronary artery calcification. Global cardiac size within normal limits. No pericardial effusion. Central pulmonary arteries are of normal caliber. Moderate atherosclerotic calcification within the thoracic aorta. No aortic aneurysm. Mediastinum/Nodes: No pathologic thoracic adenopathy. Visualized thyroid is unremarkable. The esophagus appears patulous, but is otherwise unremarkable. Lungs/Pleura: Asymmetric right apical scarring, likely post infectious or post inflammatory in nature, appears stable. 5-6 mm subpleural pulmonary nodule within  the right upper lobe, axial image # 78/5, is stable. Additional scattered 2-3 mm pulmonary nodules throughout the lungs bilaterally are also stable. No new focal pulmonary nodules or infiltrates. No pneumothorax or pleural  effusion. Central airways are widely patent. Musculoskeletal: Multiple mild anterior wedge deformities of the midthoracic spine with associated mild thoracic dextroscoliosis is stable since prior examination. No acute bone abnormality. No lytic or blastic bone lesion. CT ABDOMEN PELVIS FINDINGS Hepatobiliary: No focal liver abnormality is seen. No gallstones, gallbladder wall thickening, or biliary dilatation. Pancreas: Unremarkable Spleen: Unremarkable Adrenals/Urinary Tract: The adrenal glands are unremarkable. The kidneys are normal in position. The kidneys are mildly atrophic bilaterally, stable since prior examination. Vascular calcifications are noted within the renal hila bilaterally. No hydronephrosis. No urinary calculi. No perinephric fluid collections are seen. The bladder is unremarkable. Stomach/Bowel: Stomach is within normal limits. Appendix appears normal. No evidence of bowel wall thickening, distention, or inflammatory changes. No free intraperitoneal gas or fluid. Vascular/Lymphatic: Extensive aortoiliac atherosclerotic calcification. Particularly prominent atherosclerotic calcification noted within the lower extremity arterial inflow and visualized outflow. No aortic aneurysm. No pathologic adenopathy within the abdomen and pelvis. Reproductive: Prostate is unremarkable. Other: No abdominal wall hernia.  Rectum unremarkable. Musculoskeletal: Degenerative changes are seen within the lumbar spine. No acute bone abnormality. No lytic or blastic bone lesion identified. IMPRESSION: No acute intrathoracic or intra-abdominal pathology identified. No definite radiographic explanation for the patient's reported symptoms. Extensive multi-vessel coronary artery calcification. Stable asymmetric right apical pulmonary tumefactive scarring, likely the result of remote infection or inflammation. Stable scattered pulmonary nodules within the lungs bilaterally, with a dominant nodule noted within the right upper  lobe measuring 5-6 mm. These are stable since remote prior examination of 01/11/2019 and are safely considered benign. Peripheral vascular disease. If there is clinical evidence of lower extremity arterial insufficiency, CT arteriography may be more helpful for further evaluation. Aortic Atherosclerosis (ICD10-I70.0). Electronically Signed   By: Fidela Salisbury MD   On: 02/16/2021 02:07        Scheduled Meds:  amLODipine  5 mg Oral Daily   aspirin  81 mg Oral Daily   calcitRIOL  0.25 mcg Oral Daily   Chlorhexidine Gluconate Cloth  6 each Topical Daily   cholecalciferol  1,000 Units Oral Daily   citalopram  10 mg Oral Daily   enoxaparin (LOVENOX) injection  30 mg Subcutaneous Q24H   finasteride  5 mg Oral Daily   latanoprost  1 drop Both Eyes QHS   melatonin  5 mg Oral QHS   pantoprazole  40 mg Oral Daily   tamsulosin  0.8 mg Oral Daily   traZODone  150 mg Oral QHS   Continuous Infusions:   LOS: 1 day    Time spent: 32 minutes, more than 50% time spent on direct patient care.    Sharen Hones, MD [facility]   To contact the attending provider between 7A-7P or the covering provider during after hours 7P-7A, please log into the web site [email or web address] and access using universal [place] password for that web site. If you do not have the password, please call the hospital operator.  02/17/2021, 1:50 PM

## 2021-02-18 DIAGNOSIS — N184 Chronic kidney disease, stage 4 (severe): Secondary | ICD-10-CM

## 2021-02-18 DIAGNOSIS — N179 Acute kidney failure, unspecified: Secondary | ICD-10-CM

## 2021-02-18 DIAGNOSIS — Z515 Encounter for palliative care: Secondary | ICD-10-CM

## 2021-02-18 DIAGNOSIS — U071 COVID-19: Secondary | ICD-10-CM | POA: Diagnosis not present

## 2021-02-18 DIAGNOSIS — R338 Other retention of urine: Secondary | ICD-10-CM | POA: Diagnosis not present

## 2021-02-18 DIAGNOSIS — Z66 Do not resuscitate: Secondary | ICD-10-CM

## 2021-02-18 DIAGNOSIS — I214 Non-ST elevation (NSTEMI) myocardial infarction: Principal | ICD-10-CM

## 2021-02-18 DIAGNOSIS — I161 Hypertensive emergency: Secondary | ICD-10-CM

## 2021-02-18 DIAGNOSIS — Z7189 Other specified counseling: Secondary | ICD-10-CM

## 2021-02-18 LAB — RESP PANEL BY RT-PCR (FLU A&B, COVID) ARPGX2
Influenza A by PCR: NEGATIVE
Influenza B by PCR: NEGATIVE
SARS Coronavirus 2 by RT PCR: POSITIVE — AB

## 2021-02-18 LAB — CBC
HCT: 28.9 % — ABNORMAL LOW (ref 39.0–52.0)
Hemoglobin: 9.5 g/dL — ABNORMAL LOW (ref 13.0–17.0)
MCH: 33.2 pg (ref 26.0–34.0)
MCHC: 32.9 g/dL (ref 30.0–36.0)
MCV: 101 fL — ABNORMAL HIGH (ref 80.0–100.0)
Platelets: 142 10*3/uL — ABNORMAL LOW (ref 150–400)
RBC: 2.86 MIL/uL — ABNORMAL LOW (ref 4.22–5.81)
RDW: 14.3 % (ref 11.5–15.5)
WBC: 5.8 10*3/uL (ref 4.0–10.5)
nRBC: 0 % (ref 0.0–0.2)

## 2021-02-18 LAB — URINE CULTURE: Culture: 30000 — AB

## 2021-02-18 LAB — BASIC METABOLIC PANEL
Anion gap: 6 (ref 5–15)
BUN: 38 mg/dL — ABNORMAL HIGH (ref 8–23)
CO2: 22 mmol/L (ref 22–32)
Calcium: 8.5 mg/dL — ABNORMAL LOW (ref 8.9–10.3)
Chloride: 110 mmol/L (ref 98–111)
Creatinine, Ser: 3.14 mg/dL — ABNORMAL HIGH (ref 0.61–1.24)
GFR, Estimated: 19 mL/min — ABNORMAL LOW (ref >60)
Glucose, Bld: 77 mg/dL (ref 70–99)
Potassium: 5.8 mmol/L — ABNORMAL HIGH (ref 3.5–5.1)
Sodium: 138 mmol/L (ref 135–145)

## 2021-02-18 LAB — MAGNESIUM: Magnesium: 2.1 mg/dL (ref 1.7–2.4)

## 2021-02-18 LAB — POTASSIUM: Potassium: 5 mmol/L (ref 3.5–5.1)

## 2021-02-18 LAB — HEPARIN LEVEL (UNFRACTIONATED)
Heparin Unfractionated: 0.29 IU/mL — ABNORMAL LOW (ref 0.30–0.70)
Heparin Unfractionated: 0.37 IU/mL (ref 0.30–0.70)
Heparin Unfractionated: 0.3 IU/mL (ref 0.30–0.70)

## 2021-02-18 MED ORDER — SODIUM POLYSTYRENE SULFONATE 15 GM/60ML PO SUSP
45.0000 g | Freq: Once | ORAL | Status: AC
  Administered 2021-02-18: 45 g via ORAL
  Filled 2021-02-18: qty 180

## 2021-02-18 MED ORDER — HEPARIN BOLUS VIA INFUSION
950.0000 [IU] | Freq: Once | INTRAVENOUS | Status: AC
  Administered 2021-02-18: 950 [IU] via INTRAVENOUS
  Filled 2021-02-18: qty 950

## 2021-02-18 MED ORDER — AMOXICILLIN 500 MG PO CAPS
500.0000 mg | ORAL_CAPSULE | Freq: Two times a day (BID) | ORAL | Status: DC
  Administered 2021-02-18 – 2021-02-19 (×3): 500 mg via ORAL
  Filled 2021-02-18 (×4): qty 1

## 2021-02-18 MED ORDER — PATIROMER SORBITEX CALCIUM 8.4 G PO PACK
8.4000 g | PACK | Freq: Every day | ORAL | Status: DC
  Administered 2021-02-18: 8.4 g via ORAL
  Filled 2021-02-18 (×2): qty 1

## 2021-02-18 NOTE — Consult Note (Signed)
ANTICOAGULATION CONSULT NOTE - Initial Consult  Pharmacy Consult for heparin infusion Indication: chest pain/ACS  No Known Allergies  Patient Measurements: Height: '6\' 2"'$  (188 cm) Weight: 62.6 kg (138 lb 0.1 oz) IBW/kg (Calculated) : 82.2 Heparin Dosing Weight: 62.6 kg  Vital Signs: Temp: 99.8 F (37.7 C) (07/19 2023) BP: 153/47 (07/19 2023) Pulse Rate: 71 (07/19 2023)  Labs: Recent Labs    02/16/21 0603 02/17/21 0545 02/17/21 0842 02/17/21 1049 02/18/21 0257 02/18/21 1220 02/18/21 2030  HGB 10.4*  --   --   --  9.5*  --   --   HCT 32.2*  --   --   --  28.9*  --   --   PLT 175  --   --   --  142*  --   --   HEPARINUNFRC  --   --   --   --  0.29* 0.37 0.30  CREATININE 3.22*  --   --   --  3.14*  --   --   TROPONINIHS 15 1,076* 1,061* 966*  --   --   --      Estimated Creatinine Clearance: 15.2 mL/min (A) (by C-G formula based on SCr of 3.14 mg/dL (H)).   Medical History: Past Medical History:  Diagnosis Date   COPD (chronic obstructive pulmonary disease) (HCC)    Hypertension    Peripheral vascular disease (HCC)    Renal disorder    Stage 4 chronic kidney disease (HCC)     Medications:  Last dose of Enoxaparin 30 mg (ppx) given 7/18 @ 0900 >> Hep gtt + ASA / Plavix, BB, Statin  Assessment: 87 y/o male complains of persistent shortness of breath dyspnea weakness. Troponin elevated 15> 1076. Pharmacy has been consulted for initiation and management of heparin infusion for ACS.  Date Time aPTT/HL Rate/Comment 7/19 0257 0.29  Subthera; 750 > 850 un/hr    7/19 1220 0.37  Thera x1; 850 un/hr 7/19 2030 0.30  Thera x2; 850 un/hr  Baseline Labs: aPTT - unk BL Hgb - 10>9.5 Plts - 189>142 Troponin: 15 >> 1076 >> 1061 >> 966  Goal of Therapy:  Heparin level 0.3-0.7 units/ml Monitor platelets by anticoagulation protocol: Yes   Plan:  Therapeutic x2, continue at current rate of heparin 850 units/hr Recheck HL with AM labs Monitor CBC w/ AM labs daily.    Sherilyn Banker, PharmD Clinical Pharmacist   02/18/2021,9:00 PM

## 2021-02-18 NOTE — Progress Notes (Signed)
PROGRESS NOTE    Max Ellis  [id number] DOB: 09/28/1933 DOA: 02/15/2021 PCP: [place] Pink, MD   Chief complaint.  Chest pain. Brief Narrative:  Max Ellis is a 87 y.o. male with medical history significant for COPD, hypertension, peripheral vascular disease, stage IV chronic kidney disease, who presented to the emergency room with acute onset of substernal chest pain with associated mild dyspnea and feeling of bloating with no nausea or vomiting or abdominal pain.  Initial troponin was normal, increased to 1076 on 7/18 am.  Patient is seen by cardiology, placed on heparin drip for 48 hours. 7/19.  Patient had a visitor 3 days ago who was found to have positive for COVID.  Patient was tested again today was positive.    Assessment & Plan:   Active Problems:   Essential hypertension   Hypertensive urgency   Acute urinary retention   Chronic kidney disease, stage 4 (severe) (HCC)   NSTEMI (non-ST elevated myocardial infarction) ([place])  #1.  Non-STEMI. Hypertensive emergency. Condition is a stable.  Continue aspirin, Plavix, Lipitor and heparin drip. Echocardiogram showed ejection fraction 60 to 65% with no regional abnormality.  2.  Acute urinary retention secondary to benign prostate hypertrophy. Benign prostate hypertrophy. Enterococcus urinary tract infection. Patient has a Foley catheter anchored, Flomax started.  Patient may continue Foley catheter for 1 to 2 weeks and follow-up with urology to remove it as outpatient. Patient also has a positive urine culture with Enterococcus faecalis, given patient significant urinary retention, this is probably a true infection.  We will treat with amoxicillin for 7 days.  3.  Chronic kidney disease stage IV. Hyperkalemia Patient was given Kayexalate, potassium has normalized.  Renal function also stable. Obtain nephrology consult in case patient needed heart cath.  4.  COVID infection. Patient had a visitor 3 days ago, who  turned out to be positive for COVID.  He currently has no symptoms.  But he tested positive for COVID.  We will continue isolation.  Patient has no respiratory symptoms, no hypoxemia, no need for antiviral treatment or steroids.  Patient daughter I decided to take patient home at time of discharge, probably can go home tomorrow after completion of 48 hours of heparin.    DVT prophylaxis: Lovenox Code Status: DNR Family Communication: daughter updated Disposition Plan:    Status is: Inpatient  Remains inpatient appropriate because:IV treatments appropriate due to intensity of illness or inability to take PO and Inpatient level of care appropriate due to severity of illness  Dispo: The patient is from: Home              Anticipated d/c is to: Home              Patient currently is not medically stable to d/c.   Difficult to place patient No        I/O last 3 completed shifts: In: 230.3 [P.O.:120; I.V.:110.3] Out: 1205 [Urine:1205] No intake/output data recorded.     Consultants:  Cardiology,nephrology  Procedures: none  Antimicrobials: amoxicillin  Subjective: Patient doing well today and wished to go home. Denies any chest pain short of breath, no hypoxia. No abdominal pain or nausea vomiting. No fever chills per No dysuria hematuria.  Objective: Vitals:   02/17/21 2138 02/18/21 0418 02/18/21 0826 02/18/21 1215  BP: (!) 152/69 (!) 144/53 (!) 133/52 (!) 133/56  Pulse: 73 64 62 66  Resp: '18 18 18 18  '$ Temp: 97.6 F (36.4 C) 98 F (36.7 C) 98.3  F (36.8 C) 97.9 F (36.6 C)  TempSrc:  Oral Oral   SpO2: 100% 100% 99% 98%  Weight:      Height:        Intake/Output Summary (Last 24 hours) at 02/18/2021 1318 Last data filed at 02/18/2021 0500 Gross per 24 hour  Intake 110.3 ml  Output 700 ml  Net -589.7 ml   Filed Weights   02/15/21 1827 02/16/21 0521  Weight: 68 kg 62.6 kg    Examination:  General exam: Appears calm and comfortable  Respiratory  system: Clear to auscultation. Respiratory effort normal. Cardiovascular system: S1 & S2 heard, RRR. No JVD, murmurs, rubs, gallops or clicks. No pedal edema. Gastrointestinal system: Abdomen is nondistended, soft and nontender. No organomegaly or masses felt. Normal bowel sounds heard. Central nervous system: Alert and oriented x3. No focal neurological deficits. Extremities: Symmetric 5 x 5 power. Skin: No rashes, lesions or ulcers Psychiatry: Mood & affect appropriate.     Data Reviewed: I have personally reviewed following labs and imaging studies  CBC: Recent Labs  Lab 02/15/21 1828 02/16/21 0603 02/18/21 0257  WBC 7.1 7.8 5.8  HGB 10.0* 10.4* 9.5*  HCT 31.5* 32.2* 28.9*  MCV 102.3* 102.2* 101.0*  PLT 189 175 [id number]*   Basic Metabolic Panel: Recent Labs  Lab 02/15/21 1828 02/16/21 0603 02/18/21 0257 02/18/21 1220  NA 140 139 138  --   K 5.4* 5.0 5.8* 5.0  CL 110 109 110  --   CO2 '25 24 22  '$ --   GLUCOSE 108* 89 77  --   BUN 42* 39* 38*  --   CREATININE 3.70* 3.22* 3.14*  --   CALCIUM 9.0 8.9 8.5*  --   MG  --   --  2.1  --    GFR: Estimated Creatinine Clearance: 15.2 mL/min (A) (by C-G formula based on SCr of 3.14 mg/dL (H)). Liver Function Tests: No results for input(s): AST, ALT, ALKPHOS, BILITOT, PROT, ALBUMIN in the last 168 hours. No results for input(s): LIPASE, AMYLASE in the last 168 hours. No results for input(s): AMMONIA in the last 168 hours. Coagulation Profile: No results for input(s): INR, PROTIME in the last 168 hours. Cardiac Enzymes: No results for input(s): CKTOTAL, CKMB, CKMBINDEX, TROPONINI in the last 168 hours. BNP (last 3 results) No results for input(s): PROBNP in the last 8760 hours. HbA1C: No results for input(s): HGBA1C in the last 72 hours. CBG: No results for input(s): GLUCAP in the last 168 hours. Lipid Profile: No results for input(s): CHOL, HDL, LDLCALC, TRIG, CHOLHDL, LDLDIRECT in the last 72 hours. Thyroid Function  Tests: No results for input(s): TSH, T4TOTAL, FREET4, T3FREE, THYROIDAB in the last 72 hours. Anemia Panel: No results for input(s): VITAMINB12, FOLATE, FERRITIN, TIBC, IRON, RETICCTPCT in the last 72 hours. Sepsis Labs: No results for input(s): PROCALCITON, LATICACIDVEN in the last 168 hours.  Recent Results (from the past 240 hour(s))  Resp Panel by RT-PCR (Flu A&B, Covid) Nasopharyngeal Swab     Status: None   Collection Time: 02/16/21  2:56 AM   Specimen: Nasopharyngeal Swab; Nasopharyngeal(NP) swabs in vial transport medium  Result Value Ref Range Status   SARS Coronavirus 2 by RT PCR NEGATIVE NEGATIVE Final    Comment: (NOTE) SARS-CoV-2 target nucleic acids are NOT DETECTED.  The SARS-CoV-2 RNA is generally detectable in upper respiratory specimens during the acute phase of infection. The lowest concentration of SARS-CoV-2 viral copies this assay can detect is 138 copies/mL. A negative result does  not preclude SARS-Cov-2 infection and should not be used as the sole basis for treatment or other patient management decisions. A negative result may occur with  improper specimen collection/handling, submission of specimen other than nasopharyngeal swab, presence of viral mutation(s) within the areas targeted by this assay, and inadequate number of viral copies(<138 copies/mL). A negative result must be combined with clinical observations, patient history, and epidemiological information. The expected result is Negative.  Fact Sheet for Patients:  EntrepreneurPulse.com.au  Fact Sheet for Healthcare Providers:  IncredibleEmployment.be  This test is no t yet approved or cleared by the [place] FDA and  has been authorized for detection and/or diagnosis of SARS-CoV-2 by FDA under an Emergency Use Authorization (EUA). This EUA will remain  in effect (meaning this test can be used) for the duration of the COVID-19 declaration under Section  564(b)(1) of the Act, 21 U.S.C.section 360bbb-3(b)(1), unless the authorization is terminated  or revoked sooner.       Influenza A by PCR NEGATIVE NEGATIVE Final   Influenza B by PCR NEGATIVE NEGATIVE Final    Comment: (NOTE) The Xpert Xpress SARS-CoV-2/FLU/RSV plus assay is intended as an aid in the diagnosis of influenza from Nasopharyngeal swab specimens and should not be used as a sole basis for treatment. Nasal washings and aspirates are unacceptable for Xpert Xpress SARS-CoV-2/FLU/RSV testing.  Fact Sheet for Patients: EntrepreneurPulse.com.au  Fact Sheet for Healthcare Providers: IncredibleEmployment.be  This test is not yet approved or cleared by the [place] FDA and has been authorized for detection and/or diagnosis of SARS-CoV-2 by FDA under an Emergency Use Authorization (EUA). This EUA will remain in effect (meaning this test can be used) for the duration of the COVID-19 declaration under Section 564(b)(1) of the Act, 21 U.S.C. section 360bbb-3(b)(1), unless the authorization is terminated or revoked.  Performed at [facility], [address] [place], [place] [address]   Urine Culture     Status: Abnormal   Collection Time: 02/16/21 12:46 PM   Specimen: Urine, Clean Catch  Result Value Ref Range Status   Specimen Description   Final    URINE, CLEAN CATCH Performed at [facility], [address] [place], [place] [address]    Special Requests   Final    NONE Performed at [facility], [place], [place] [address]    Culture 30,000 COLONIES/mL ENTEROCOCCUS FAECALIS (A)  Final   Report Status 02/18/2021 FINAL  Final   Organism ID, Bacteria ENTEROCOCCUS FAECALIS (A)  Final      Susceptibility   Enterococcus faecalis - MIC*    AMPICILLIN <=2 SENSITIVE Sensitive     NITROFURANTOIN <=16 SENSITIVE Sensitive     VANCOMYCIN <=0.5 SENSITIVE Sensitive     * 30,000 COLONIES/mL  ENTEROCOCCUS FAECALIS  Resp Panel by RT-PCR (Flu A&B, Covid) Nasopharyngeal Swab     Status: Abnormal   Collection Time: 02/18/21 10:20 AM   Specimen: Nasopharyngeal Swab; Nasopharyngeal(NP) swabs in vial transport medium  Result Value Ref Range Status   SARS Coronavirus 2 by RT PCR POSITIVE (A) NEGATIVE Final    Comment: RESULT CALLED TO, READ BACK BY AND VERIFIED WITH: Matilde Sprang, RN [id number]. 02/18/2021 GAA (NOTE) SARS-CoV-2 target nucleic acids are DETECTED.  The SARS-CoV-2 RNA is generally detectable in upper respiratory specimens during the acute phase of infection. Positive results are indicative of the presence of the identified virus, but do not rule out bacterial infection or co-infection with other pathogens not detected by the test.  Clinical correlation with patient history and other diagnostic information is necessary to determine patient infection status. The expected result is Negative.  Fact Sheet for Patients: EntrepreneurPulse.com.au  Fact Sheet for Healthcare Providers: IncredibleEmployment.be  This test is not yet approved or cleared by the [place] FDA and  has been authorized for detection and/or diagnosis of SARS-CoV-2 by FDA under an Emergency Use Authorization (EUA).  This EUA will remain in effect (meaning this test can  be used) for the duration of  the COVID-19 declaration under Section 564(b)(1) of the Act, 21 U.S.C. section 360bbb-3(b)(1), unless the authorization is terminated or revoked sooner.     Influenza A by PCR NEGATIVE NEGATIVE Final   Influenza B by PCR NEGATIVE NEGATIVE Final    Comment: (NOTE) The Xpert Xpress SARS-CoV-2/FLU/RSV plus assay is intended as an aid in the diagnosis of influenza from Nasopharyngeal swab specimens and should not be used as a sole basis for treatment. Nasal washings and aspirates are unacceptable for Xpert Xpress SARS-CoV-2/FLU/RSV testing.  Fact Sheet for  Patients: EntrepreneurPulse.com.au  Fact Sheet for Healthcare Providers: IncredibleEmployment.be  This test is not yet approved or cleared by the [place] FDA and has been authorized for detection and/or diagnosis of SARS-CoV-2 by FDA under an Emergency Use Authorization (EUA). This EUA will remain in effect (meaning this test can be used) for the duration of the COVID-19 declaration under Section 564(b)(1) of the Act, 21 U.S.C. section 360bbb-3(b)(1), unless the authorization is terminated or revoked.  Performed at [facility], [address], [place], [place] [address]          Radiology Studies: ECHOCARDIOGRAM COMPLETE  Result Date: 02/17/2021    ECHOCARDIOGRAM REPORT   Patient Name:   Max Ellis Date of Exam: 02/17/2021 Medical Rec #:  [id number]     Height:       74.0 in Accession #:    [id number]    Weight:       138.0 lb Date of Birth:  [birth year]     BSA:          1.856 m Patient Age:    32 years      BP:           106/47 mmHg Patient Gender: M             HR:           75 bpm. Exam Location:  [facility] Procedure: 2D Echo, Cardiac Doppler and Color Doppler Indications:     Elevated troponin  History:         Patient has no prior history of Echocardiogram examinations.                  COPD; Risk Factors:Hypertension.  Sonographer:     Sherrie Sport RDCS (AE) Referring Phys:  [id number] [facility] D CALLWOOD Diagnosing Phys: Serafina Royals MD  Sonographer Comments: Technically challenging study due to limited acoustic windows, no apical window and suboptimal parasternal window. Image acquisition challenging due to patient body habitus and Image acquisition challenging due to COPD. IMPRESSIONS  1. Left ventricular ejection fraction, by estimation, is 60 to 65%. The left ventricle has normal function. The left ventricle has no regional wall motion abnormalities. Left ventricular diastolic parameters were normal.  2. Right ventricular systolic  function is normal. The right ventricular size is normal.  3. The mitral valve is normal in structure. Mild mitral valve regurgitation.  4. The aortic valve is normal in structure. Aortic valve regurgitation  is trivial. FINDINGS  Left Ventricle: Left ventricular ejection fraction, by estimation, is 60 to 65%. The left ventricle has normal function. The left ventricle has no regional wall motion abnormalities. The left ventricular internal cavity size was normal in size. There is  no left ventricular hypertrophy. Left ventricular diastolic parameters were normal. Right Ventricle: The right ventricular size is normal. No increase in right ventricular wall thickness. Right ventricular systolic function is normal. Left Atrium: Left atrial size was normal in size. Right Atrium: Right atrial size was normal in size. Pericardium: There is no evidence of pericardial effusion. Mitral Valve: The mitral valve is normal in structure. Mild mitral valve regurgitation. Tricuspid Valve: The tricuspid valve is normal in structure. Tricuspid valve regurgitation is mild. Aortic Valve: The aortic valve is normal in structure. Aortic valve regurgitation is trivial. Pulmonic Valve: The pulmonic valve was not well visualized. Pulmonic valve regurgitation is not visualized. Aorta: The aortic root and ascending aorta are structurally normal, with no evidence of dilitation. IAS/Shunts: No atrial level shunt detected by color flow Doppler.  LEFT VENTRICLE PLAX 2D LVIDd:         3.83 cm LVIDs:         2.59 cm LV PW:         1.35 cm LV IVS:        1.03 cm LVOT diam:     2.10 cm LVOT Area:     3.46 cm  LEFT ATRIUM         Index LA diam:    3.80 cm 2.05 cm/m                        PULMONIC VALVE AORTA                 PV Vmax:        1.12 m/s Ao Root diam: 3.50 cm PV Peak grad:   5.0 mmHg                       RVOT Peak grad: 9 mmHg  TRICUSPID VALVE TR Peak grad:   7.2 mmHg TR Vmax:        134.00 cm/s  SHUNTS Systemic Diam: 2.10 cm Serafina Royals MD Electronically signed by Serafina Royals MD Signature Date/Time: 02/17/2021/1:27:15 PM    Final         Scheduled Meds:  amoxicillin  500 mg Oral Q12H   aspirin  81 mg Oral Daily   atorvastatin  80 mg Oral Daily   calcitRIOL  0.25 mcg Oral Daily   Chlorhexidine Gluconate Cloth  6 each Topical Daily   cholecalciferol  1,000 Units Oral Daily   citalopram  10 mg Oral Daily   clopidogrel  75 mg Oral Daily   finasteride  5 mg Oral Daily   latanoprost  1 drop Both Eyes QHS   melatonin  5 mg Oral QHS   metoprolol tartrate  25 mg Oral BID   nicotine  21 mg Transdermal Daily   pantoprazole  40 mg Oral Daily   patiromer  8.4 g Oral Daily   tamsulosin  0.8 mg Oral Daily   traZODone  150 mg Oral QHS   Continuous Infusions:  heparin 850 Units/hr (02/18/21 0414)     LOS: 2 days    Time spent: 32 minutes, more than 50% time spent on direct patient care.    Sharen Hones, MD [facility]   To contact  the attending provider between 7A-7P or the covering provider during after hours 7P-7A, please log into the web site [email or web address] and access using universal [place] password for that web site. If you do not have the password, please call the hospital operator.  02/18/2021, 1:18 PM

## 2021-02-18 NOTE — Progress Notes (Signed)
[facility]  Patient Description: Max Ellis is a 87 year old male patient with PMH significant for hypertension, PVD, CKD stage IV and COPD who was admitted due to complaints of chest pain and hypertensive urgency; therefore, cardiology was consulted.  The patient was also found to be positive for COVID.  SUBJECTIVE: Today the patient reports to be feeling well and states that his previous symptoms of chest pain has all resolved at this time.  The patient denies having any dyspnea, peripheral edema, syncope or palpitations at this time.  The patient states "I did not have a heart attack and I am ready to go home."  OBJECTIVE: The patient appears fairly well on today, his vital signs are stable he is resting comfortably in bed.  The patient appears to be asymptomatic, and he is in no apparent distress.  The patient continues to be on a heparin drip at this time.  Patient's bedside telemetry monitor reveals normal sinus rhythm.  Vitals:   02/17/21 2138 02/18/21 0418 02/18/21 0826 02/18/21 1215  BP: (!) 152/69 (!) 144/53 (!) 133/52 (!) 133/56  Pulse: 73 64 62 66  Resp: '18 18 18 18  '$ Temp: 97.6 F (36.4 C) 98 F (36.7 C) 98.3 F (36.8 C) 97.9 F (36.6 C)  TempSrc:  Oral Oral   SpO2: 100% 100% 99% 98%  Weight:      Height:         Intake/Output Summary (Last 24 hours) at 02/18/2021 1556 Last data filed at 02/18/2021 1547 Gross per 24 hour  Intake 201.94 ml  Output 700 ml  Net -498.06 ml      PHYSICAL EXAM  General: Well developed, well nourished, in no acute distress HEENT:  Normocephalic and atraumatic.  PERRL Neck:  No JVD.  Lungs: Clear bilaterally to auscultation.  Chest expansion symmetrical. Heart: HRRR . Normal S1 and S2 without gallops or murmurs.  Abdomen: Bowel sounds are positive, abdomen soft and non-tender  Msk:  Normal strength and tone for age. Extremities: No clubbing, cyanosis or edema.   Neuro: Alert and oriented X 3. Psych:  Good affect, responds  appropriately   LABS: Basic Metabolic Panel: Recent Labs    02/16/21 0603 02/18/21 0257 02/18/21 1220  NA 139 138  --   K 5.0 5.8* 5.0  CL 109 110  --   CO2 24 22  --   GLUCOSE 89 77  --   BUN 39* 38*  --   CREATININE 3.22* 3.14*  --   CALCIUM 8.9 8.5*  --   MG  --  2.1  --    Liver Function Tests: No results for input(s): AST, ALT, ALKPHOS, BILITOT, PROT, ALBUMIN in the last 72 hours. No results for input(s): LIPASE, AMYLASE in the last 72 hours. CBC: Recent Labs    02/16/21 0603 02/18/21 0257  WBC 7.8 5.8  HGB 10.4* 9.5*  HCT 32.2* 28.9*  MCV 102.2* 101.0*  PLT 175 142*   Cardiac Enzymes: No results for input(s): CKTOTAL, CKMB, CKMBINDEX, TROPONINI in the last 72 hours. BNP: Invalid input(s): POCBNP D-Dimer: No results for input(s): DDIMER in the last 72 hours. Hemoglobin A1C: No results for input(s): HGBA1C in the last 72 hours. Fasting Lipid Panel: No results for input(s): CHOL, HDL, LDLCALC, TRIG, CHOLHDL, LDLDIRECT in the last 72 hours. Thyroid Function Tests: No results for input(s): TSH, T4TOTAL, T3FREE, THYROIDAB in the last 72 hours.  Invalid input(s): FREET3 Anemia Panel: No results for input(s): VITAMINB12, FOLATE, FERRITIN, TIBC, IRON, RETICCTPCT in the  last 72 hours.  ECHOCARDIOGRAM COMPLETE  Result Date: 02/17/2021    ECHOCARDIOGRAM REPORT   Patient Name:   Max Ellis Date of Exam: 02/17/2021 Medical Rec #:  [id number]     Height:       74.0 in Accession #:    [id number]    Weight:       138.0 lb Date of Birth:  Sep 05, 1933     BSA:          1.856 m Patient Age:    87 years      BP:           106/47 mmHg Patient Gender: M             HR:           75 bpm. Exam Location:  [facility] Procedure: 2D Echo, Cardiac Doppler and Color Doppler Indications:     Elevated troponin  History:         Patient has no prior history of Echocardiogram examinations.                  COPD; Risk Factors:Hypertension.  Sonographer:     Sherrie Sport RDCS (AE) Referring Phys:   [id number] [facility] D CALLWOOD Diagnosing Phys: Serafina Royals MD  Sonographer Comments: Technically challenging study due to limited acoustic windows, no apical window and suboptimal parasternal window. Image acquisition challenging due to patient body habitus and Image acquisition challenging due to COPD. IMPRESSIONS  1. Left ventricular ejection fraction, by estimation, is 60 to 65%. The left ventricle has normal function. The left ventricle has no regional wall motion abnormalities. Left ventricular diastolic parameters were normal.  2. Right ventricular systolic function is normal. The right ventricular size is normal.  3. The mitral valve is normal in structure. Mild mitral valve regurgitation.  4. The aortic valve is normal in structure. Aortic valve regurgitation is trivial. FINDINGS  Left Ventricle: Left ventricular ejection fraction, by estimation, is 60 to 65%. The left ventricle has normal function. The left ventricle has no regional wall motion abnormalities. The left ventricular internal cavity size was normal in size. There is  no left ventricular hypertrophy. Left ventricular diastolic parameters were normal. Right Ventricle: The right ventricular size is normal. No increase in right ventricular wall thickness. Right ventricular systolic function is normal. Left Atrium: Left atrial size was normal in size. Right Atrium: Right atrial size was normal in size. Pericardium: There is no evidence of pericardial effusion. Mitral Valve: The mitral valve is normal in structure. Mild mitral valve regurgitation. Tricuspid Valve: The tricuspid valve is normal in structure. Tricuspid valve regurgitation is mild. Aortic Valve: The aortic valve is normal in structure. Aortic valve regurgitation is trivial. Pulmonic Valve: The pulmonic valve was not well visualized. Pulmonic valve regurgitation is not visualized. Aorta: The aortic root and ascending aorta are structurally normal, with no evidence of dilitation.  IAS/Shunts: No atrial level shunt detected by color flow Doppler.  LEFT VENTRICLE PLAX 2D LVIDd:         3.83 cm LVIDs:         2.59 cm LV PW:         1.35 cm LV IVS:        1.03 cm LVOT diam:     2.10 cm LVOT Area:     3.46 cm  LEFT ATRIUM         Index LA diam:    3.80 cm 2.05 cm/m  PULMONIC VALVE AORTA                 PV Vmax:        1.12 m/s Ao Root diam: 3.50 cm PV Peak grad:   5.0 mmHg                       RVOT Peak grad: 9 mmHg  TRICUSPID VALVE TR Peak grad:   7.2 mmHg TR Vmax:        134.00 cm/s  SHUNTS Systemic Diam: 2.10 cm Serafina Royals MD Electronically signed by Serafina Royals MD Signature Date/Time: 02/17/2021/1:27:15 PM    Final      Echo: Normal LV systolic function with no regional wall motion abnormalities and an estimated EF of 60 to 65%, normal RV systolic function and mild mitral valve regurgitation.  TELEMETRY: Normal sinus rhythm  ASSESSMENT AND PLAN:  Active Problems:   Essential hypertension   Hypertensive urgency   Acute urinary retention   Chronic kidney disease, stage 4 (severe) (HCC)   NSTEMI (non-ST elevated myocardial infarction) ([place])   COVID-19 virus infection    1.  Hypertensive urgency, resolved at this time, patient is normotensive at this time  -Recommend continuing management with metoprolol and IV hydralazine as needed.  -Recommend following a Dash diet.  -Follow-up with cardiology in 1 week post discharge.  2.  Intermittent chest pain with elevated troponins, 12>>11>>15>>1076>>1061>>966, stable, patient denies having chest pain at this time  -Recommend discontinuing heparin drip at this time and starting the patient on 81 mg of aspirin daily and Plavix 75 mg once daily.  -Echocardiogram was unremarkable.  -No further cardiac studies recommended at this time as inpatient.   -Follow up with Cardiology as outpatient for possible nuclear stress test.   -Recommend pulmonary heart healthy diet.  -Continuous cardiac monitoring  until discharge.   3.  CKD stage IV, fairly stable  -Agree with current management.  -Recommend continuing to trend CMP.  4.  GERD, fairly stable  -Recommend continuing PPI and H2 blocker therapy.  5.  PVD, not exacerbated  -Management per outpatient vascular.  The patient's history, physical exam findings and plan of care were all discussed with Dr. Karma Greaser D. Callwood, whom also evaluated the patient, and all decision making was made in collaboration with him.   Martin, ACNPC-AG  02/18/2021 3:56 PM

## 2021-02-18 NOTE — Consult Note (Signed)
ANTICOAGULATION CONSULT NOTE - Initial Consult  Pharmacy Consult for heparin infusion Indication: chest pain/ACS  No Known Allergies  Patient Measurements: Height: '6\' 2"'$  (188 cm) Weight: 62.6 kg (138 lb 0.1 oz) IBW/kg (Calculated) : 82.2 Heparin Dosing Weight: 62.6 kg  Vital Signs: Temp: 97.9 F (36.6 C) (07/19 1215) Temp Source: Oral (07/19 0826) BP: 133/56 (07/19 1215) Pulse Rate: 66 (07/19 1215)  Labs: Recent Labs    02/15/21 1828 02/15/21 2106 02/16/21 0603 02/17/21 0545 02/17/21 0842 02/17/21 1049 02/18/21 0257 02/18/21 1220  HGB 10.0*  --  10.4*  --   --   --  9.5*  --   HCT 31.5*  --  32.2*  --   --   --  28.9*  --   PLT 189  --  175  --   --   --  142*  --   HEPARINUNFRC  --   --   --   --   --   --  0.29* 0.37  CREATININE 3.70*  --  3.22*  --   --   --  3.14*  --   TROPONINIHS 12   < > 15 1,076* 1,061* 966*  --   --    < > = values in this interval not displayed.     Estimated Creatinine Clearance: 15.2 mL/min (A) (by C-G formula based on SCr of 3.14 mg/dL (H)).   Medical History: Past Medical History:  Diagnosis Date   COPD (chronic obstructive pulmonary disease) (HCC)    Hypertension    Peripheral vascular disease (HCC)    Renal disorder    Stage 4 chronic kidney disease (HCC)     Medications:  Last dose of Enoxaparin 30 mg (ppx) given 7/18 @ 0900 >> Hep gtt + ASA / Plavix, BB, Statin  Assessment: 87 y/o male complains of persistent shortness of breath dyspnea weakness. Troponin elevated 15> 1076. Pharmacy has been consulted for initiation and management of heparin infusion for ACS.  Date Time aPTT/HL Rate/Comment 7/19 0257 0.29  Subthera; 750 > 850 un/hr    7/19 1220 0.37  Thera x1; 850 un/hr  Baseline Labs: aPTT - unk BL Hgb - 10>9.5 Plts - 189>142 Troponin: 15 >> 1076 >> 1061 >> 966  Goal of Therapy:  Heparin level 0.3-0.7 units/ml Monitor platelets by anticoagulation protocol: Yes   Plan:  Therapeutic x1;   7/19:  HL @ 1220 =  0.37 Continue at current rate of heparin 850 units/hr.   Will recheck HL 8 hrs to confirm rate then daily thereafter. CTM CBC w/ AM labs daily.   Lorna Dibble, PharmD, [facility] Clinical Pharmacist   02/18/2021,1:14 PM

## 2021-02-18 NOTE — Consult Note (Signed)
Palliative Care Consult Note                                  Date: 02/18/2021   Patient Name: Max Ellis  DOB: Apr 26, 1934  MRN: [id number]  Age / Sex: 87 y.o., male  PCP: Maryland Pink, MD Referring Physician: Sharen Hones, MD  Reason for Consultation: Establishing goals of care  HPI/Patient Profile: Palliative Care consult requested for goals of care discussion in this 87 y.o. male  with past medical history of COPD, PVD, hypertension, CKD IV, and BPH who was admitted from home with family on 02/15/2021 with complaints of chest pain. Chest x-ray showed no acute abnormalities. CT showed stable scattered pulmonary nodules within lungs bilaterally. Urine culture positive for Enterococcus faecalis, receiving antibiotics. Patient is being followed by Nephrology. Patient had a COVID + family member visit 3 days prior while hospitalized and subsequently has now tested positive for COVID-19 (7/19).  Past Medical History:  Diagnosis Date   COPD (chronic obstructive pulmonary disease) ([place])    Hypertension    Peripheral vascular disease ([place])    Renal disorder    Stage 4 chronic kidney disease (HCC)      Subjective:   This NP Osborne Oman reviewed medical records, received report from team, assessed the patient . I spoke with patient's daughter, Max Ellis to discuss diagnosis, prognosis, GOC, EOL wishes disposition and options.  Patient is awake and alert. Denies pain or discomfort. Asymptomatic. Permission to speak with daughter.    Concept of Palliative Care was introduced as specialized medical care for people and their families living with serious illness.  It focuses on providing relief from the symptoms and stress of a serious illness.  The goal is to improve quality of life for both the patient and the family. Values and goals of care important to patient and family were attempted to be elicited.  Created space and opportunity for  patient and family to explore thoughts and feelings.   Daughter shares patient lives in the home with family support. Prior to admission he was ambulatory without assistive devices. He does have a cane or walker for assistance if needed. Good appetite. Enjoys being outside.   We discussed His current illness and what it means in the larger context of His on-going co-morbidities. Natural disease trajectory and expectations were discussed. Questions and concerns addressed.   Patient and daughter verbalized understanding of his current illness and co-morbidities. Family is remaining hopeful for continued improvement.   [name] is clear in expressed goals for patient to return home with family support. She and patient feels his quality of life was sufficient prior to admission and is hopeful this will continue for some time.   I discussed the importance of continued conversation with family and their medical providers regarding overall plan of care and treatment options, ensuring decisions are within the context of the patients values and GOCs.  Questions and concerns were addressed. The family was encouraged to call with questions or concerns.  PMT will continue to support holistically as needed.  Life Review: Patient is widowed, his wife passed away over 6 weeks ago. He had 3 children (unfortunately one of his daughters is deceased). Retired [employer] representative.    Objective:   Primary Diagnoses: Present on Admission:  Hypertensive urgency  Essential hypertension   Scheduled Meds:  amoxicillin  500 mg Oral Q12H   aspirin  81 mg  Oral Daily   atorvastatin  80 mg Oral Daily   calcitRIOL  0.25 mcg Oral Daily   Chlorhexidine Gluconate Cloth  6 each Topical Daily   cholecalciferol  1,000 Units Oral Daily   citalopram  10 mg Oral Daily   clopidogrel  75 mg Oral Daily   finasteride  5 mg Oral Daily   latanoprost  1 drop Both Eyes QHS   melatonin  5 mg Oral QHS   metoprolol tartrate  25 mg  Oral BID   nicotine  21 mg Transdermal Daily   pantoprazole  40 mg Oral Daily   patiromer  8.4 g Oral Daily   tamsulosin  0.8 mg Oral Daily   traZODone  150 mg Oral QHS    Continuous Infusions:  heparin 850 Units/hr (02/18/21 0414)    PRN Meds: acetaminophen **OR** acetaminophen, hydrALAZINE, magnesium hydroxide, ondansetron **OR** ondansetron (ZOFRAN) IV  No Known Allergies  Review of Systems  Neurological:  Positive for weakness.  Unless otherwise noted, a complete review of systems is negative.  Physical Exam General: NAD, elderly  Cardiovascular: regular rate and rhythm Pulmonary: diminished bilaterally  Abdomen: soft, nontender, + bowel sounds Extremities: no edema, no joint deformities Skin: no rashes, warm and dry Neurological: AAO x3, mood appropriate   Vital Signs:  BP (!) 133/56 (BP Location: Left Arm)   Pulse 66   Temp 97.9 F (36.6 C)   Resp 18   Ht '6\' 2"'$  (1.88 m)   Wt 62.6 kg   SpO2 98%   BMI 17.72 kg/m  Pain Scale: 0-10   Pain Score: 0-No pain  SpO2: SpO2: 98 % O2 Device:SpO2: 98 % O2 Flow Rate: .   IO: Intake/output summary:  Intake/Output Summary (Last 24 hours) at 02/18/2021 1530 Last data filed at 02/18/2021 0500 Gross per 24 hour  Intake 110.3 ml  Output 700 ml  Net -589.7 ml    LBM: Last BM Date: 02/16/21 Baseline Weight: Weight: 68 kg Most recent weight: Weight: 62.6 kg      Palliative Assessment/Data: PPS 30%   Advanced Care Planning:   Primary Decision Maker: HCPOA  Code Status/Advance Care Planning: DNR  A discussion was had today regarding advanced directives. Concepts specific to code status, artifical feeding and hydration, continued IV antibiotics and rehospitalization was had.    Patient and daughter confirms DNR/DNI. He would not be interested in any forms of artificial feedings/PEG. The MOST form was introduced and discussed. Declined to complete at this time.   Palliative Care services outpatient were explained  and offered. Palliative's goals and philosophy of care. Daughter declines at this time. Education provided and family is aware they may initiate services at anytime by communicating with primary care team.    Assessment & Plan:   SUMMARY OF RECOMMENDATIONS   DNR/DNI-as confirmed by patient and daughter Continue with current plan of care, treat the treatable Patient and daughter clear in expressed goals to continue all treatment with a goal of patient returning home with family support. Daughter declines the options of outpatient palliative support or home health. They are remaining hopeful patient will continue to thrive as his quality of life has been good considering the recent death of his wife.  PMT will continue to support and follow as needed. Please call team line with urgent needs.  Symptom Management:  Per Attending  Palliative Prophylaxis:  Aspiration  Additional Recommendations (Limitations, Scope, Preferences): Full Scope Treatment  Psycho-social/Spiritual:  Desire for further Chaplaincy support: no Additional Recommendations: Caregiving  Support/Resources  Prognosis:  Unable to determine  Discharge Planning:  Home with family     Patient and daughter expressed understanding and was in agreement with this plan.   Time In: 1340 Time Out: 1425 Time Total: 45 min.   Visit consisted of counseling and education dealing with the complex and emotionally intense issues of symptom management and palliative care in the setting of serious and potentially life-threatening illness.Greater than 50%  of this time was spent counseling and coordinating care related to the above assessment and plan.  Signed by:  Alda Lea, AGPCNP-BC Palliative Medicine Team  Phone: [phone] Pager: [phone] Amion: Bjorn Pippin   Thank you for allowing the Palliative Medicine Team to assist in the care of this patient. Please utilize secure chat with additional questions, if  there is no response within 30 minutes please call the above phone number. Palliative Medicine Team providers are available by phone from 7am to 5pm daily and can be reached through the team cell phone.  Should this patient require assistance outside of these hours, please call the patient's attending physician.

## 2021-02-18 NOTE — TOC Initial Note (Signed)
Transition of Care [facility]) - Initial/Assessment Note    Patient Details  Name: Max Ellis MRN: [id number] Date of Birth: 10/23/1933  [facility] CM/SW Contact:    Alberteen Sam, LCSW Phone Number: 02/18/2021, 2:53 PM  Clinical Narrative:                  CSW spoke with patient's daughter [name] to inform of PT/OT recommendations of SNF. [name] reports family plans to bring patient home, as he has family support to care for him. She reports they have already spoken with doctor and are planning on taking patient home tomorrow. CSW inquired as to equipment, reports he has a wheel chair, walker and 3in1 at home with no equipment needs. CSW offered home health services however [name] declines and reports patient already has everything he needs at home. CSW encouraged [name] to reach out should she think of any discharge needs prior to patient leaving tomorrow.   Expected Discharge Plan: Home/Self Care Barriers to Discharge: Continued Medical Work up   Patient Goals and CMS Choice Patient states their goals for this hospitalization and ongoing recovery are:: to go home CMS Medicare.gov Compare Post Acute Care list provided to:: Patient Represenative (must comment) (daughter [name]) Choice offered to / list presented to : Adult Children  Expected Discharge Plan and Services Expected Discharge Plan: Home/Self Care       Living arrangements for the past 2 months: Single Family Home                                      Prior Living Arrangements/Services Living arrangements for the past 2 months: Single Family Home                     Activities of Daily Living      Permission Sought/Granted                  Emotional Assessment       Orientation: : Oriented to Self, Oriented to Place, Oriented to  Time, Oriented to Situation Alcohol / Substance Use: Not Applicable Psych Involvement: No (comment)  Admission diagnosis:  Hypertensive urgency  [I16.0] Hypertensive emergency [I16.1] Abdominal pain [R10.9] Chest pain [R07.9] Acute renal failure superimposed on stage 4 chronic kidney disease, unspecified acute renal failure type ([place]) [N17.9, N18.4] Patient Active Problem List   Diagnosis Date Noted   COVID-19 virus infection 02/18/2021   NSTEMI (non-ST elevated myocardial infarction) ([place]) 02/17/2021   Hypertensive urgency 02/16/2021   Acute urinary retention 02/16/2021   Chronic kidney disease, stage 4 (severe) ([place]) 02/16/2021   Benign prostatic hyperplasia with incomplete bladder emptying 03/03/2020   Essential hypertension 01/26/2017   Tobacco use disorder 01/26/2017   Pain in limb 01/26/2017   PCP:  [place] Pink, MD Pharmacy:   [id number] [place] [id number]: [phone]: [phone]    Social Determinants of Health (SDOH) Interventions    Readmission Risk Interventions No flowsheet data found.

## 2021-02-18 NOTE — Progress Notes (Signed)
[facility], [place] 02/18/21  Subjective:   Hospital day # 2   Renal: 07/18 0701 - 07/19 0700 In: 230.3 [P.O.:120; I.V.:110.3] Out: 825 [Urine:825] Lab Results  Component Value Date   CREATININE 3.14 (H) 02/18/2021   CREATININE 3.22 (H) 02/16/2021   CREATININE 3.70 (H) 02/15/2021   Patient known to our practice from outpatient f/u Originally admitted for substernal chest pain Dx with NSTEMI; currently denies chest pain On heparin drip Cardiac cath contemplated  Objective:  Vital signs in last 24 hours:  Temp:  [97.6 F (36.4 C)-98.3 F (36.8 C)] 98.3 F (36.8 C) (07/19 0826) Pulse Rate:  [62-75] 62 (07/19 0826) Resp:  [11-19] 18 (07/19 0826) BP: (106-158)/(47-69) 133/52 (07/19 0826) SpO2:  [99 %-100 %] 99 % (07/19 0826)  Weight change:  Filed Weights   02/15/21 1827 02/16/21 0521  Weight: 68 kg 62.6 kg    Intake/Output:    Intake/Output Summary (Last 24 hours) at 02/18/2021 0952 Last data filed at 02/18/2021 0500 Gross per 24 hour  Intake 230.3 ml  Output 700 ml  Net -469.7 ml    Physical Exam: General:  No acute distress, laying in the bed  HEENT  anicteric, moist oral mucous membrane  Pulm/lungs  normal breathing effort, lungs are clear to auscultation  CVS/Heart  regular rhythm, no rub or gallop  Abdomen:   Soft, nontender  Extremities:  No peripheral edema  Neurologic:  Alert, oriented, able to follow commands  Skin:  No acute rashes      Basic Metabolic Panel:  Recent Labs  Lab 02/15/21 1828 02/16/21 0603 02/18/21 0257  NA 140 139 138  K 5.4* 5.0 5.8*  CL 110 109 110  CO2 '25 24 22  '$ GLUCOSE 108* 89 77  BUN 42* 39* 38*  CREATININE 3.70* 3.22* 3.14*  CALCIUM 9.0 8.9 8.5*  MG  --   --  2.1     CBC: Recent Labs  Lab 02/15/21 1828 02/16/21 0603 02/18/21 0257  WBC 7.1 7.8 5.8  HGB 10.0* 10.4* 9.5*  HCT 31.5* 32.2* 28.9*  MCV 102.3* 102.2* 101.0*  PLT 189 175 142*     No results found for: HEPBSAG,  HEPBSAB, HEPBIGM    Microbiology:  Recent Results (from the past 240 hour(s))  Resp Panel by RT-PCR (Flu A&B, Covid) Nasopharyngeal Swab     Status: None   Collection Time: 02/16/21  2:56 AM   Specimen: Nasopharyngeal Swab; Nasopharyngeal(NP) swabs in vial transport medium  Result Value Ref Range Status   SARS Coronavirus 2 by RT PCR NEGATIVE NEGATIVE Final    Comment: (NOTE) SARS-CoV-2 target nucleic acids are NOT DETECTED.  The SARS-CoV-2 RNA is generally detectable in upper respiratory specimens during the acute phase of infection. The lowest concentration of SARS-CoV-2 viral copies this assay can detect is 138 copies/mL. A negative result does not preclude SARS-Cov-2 infection and should not be used as the sole basis for treatment or other patient management decisions. A negative result may occur with  improper specimen collection/handling, submission of specimen other than nasopharyngeal swab, presence of viral mutation(s) within the areas targeted by this assay, and inadequate number of viral copies(<138 copies/mL). A negative result must be combined with clinical observations, patient history, and epidemiological information. The expected result is Negative.  Fact Sheet for Patients:  EntrepreneurPulse.com.au  Fact Sheet for Healthcare Providers:  IncredibleEmployment.be  This test is no t yet approved or cleared by the [place] and  has been authorized for  detection and/or diagnosis of SARS-CoV-2 by FDA under an Emergency Use Authorization (EUA). This EUA will remain  in effect (meaning this test can be used) for the duration of the COVID-19 declaration under Section 564(b)(1) of the Act, 21 U.S.C.section 360bbb-3(b)(1), unless the authorization is terminated  or revoked sooner.       Influenza A by PCR NEGATIVE NEGATIVE Final   Influenza B by PCR NEGATIVE NEGATIVE Final    Comment: (NOTE) The Xpert Xpress  SARS-CoV-2/FLU/RSV plus assay is intended as an aid in the diagnosis of influenza from Nasopharyngeal swab specimens and should not be used as a sole basis for treatment. Nasal washings and aspirates are unacceptable for Xpert Xpress SARS-CoV-2/FLU/RSV testing.  Fact Sheet for Patients: EntrepreneurPulse.com.au  Fact Sheet for Healthcare Providers: IncredibleEmployment.be  This test is not yet approved or cleared by the [place] FDA and has been authorized for detection and/or diagnosis of SARS-CoV-2 by FDA under an Emergency Use Authorization (EUA). This EUA will remain in effect (meaning this test can be used) for the duration of the COVID-19 declaration under Section 564(b)(1) of the Act, 21 U.S.C. section 360bbb-3(b)(1), unless the authorization is terminated or revoked.  Performed at [facility], [address] [place], [place] [address]   Urine Culture     Status: Abnormal   Collection Time: 02/16/21 12:46 PM   Specimen: Urine, Clean Catch  Result Value Ref Range Status   Specimen Description   Final    URINE, CLEAN CATCH Performed at [facility], [address] [place], [place] [address]    Special Requests   Final    NONE Performed at [facility], [place], [place] [address]    Culture 30,000 COLONIES/mL ENTEROCOCCUS FAECALIS (A)  Final   Report Status 02/18/2021 FINAL  Final   Organism ID, Bacteria ENTEROCOCCUS FAECALIS (A)  Final      Susceptibility   Enterococcus faecalis - MIC*    AMPICILLIN <=2 SENSITIVE Sensitive     NITROFURANTOIN <=16 SENSITIVE Sensitive     VANCOMYCIN <=0.5 SENSITIVE Sensitive     * 30,000 COLONIES/mL ENTEROCOCCUS FAECALIS    Coagulation Studies: No results for input(s): LABPROT, INR in the last 72 hours.  Urinalysis: Recent Labs    02/16/21 1246  COLORURINE YELLOW*  LABSPEC 1.013  PHURINE 6.0  GLUCOSEU NEGATIVE  HGBUR NEGATIVE   BILIRUBINUR NEGATIVE  KETONESUR NEGATIVE  PROTEINUR NEGATIVE  NITRITE NEGATIVE  LEUKOCYTESUR TRACE*      Imaging: ECHOCARDIOGRAM COMPLETE  Result Date: 02/17/2021    ECHOCARDIOGRAM REPORT   Patient Name:   Max Ellis Date of Exam: 02/17/2021 Medical Rec #:  [id number]     Height:       85.0 in Accession #:    [id number]    Weight:       138.0 lb Date of Birth:  October 28, 1981     BSA:          1.856 m Patient Age:    87 years      BP:           106/47 mmHg Patient Gender: M             HR:           75 bpm. Exam Location:  [facility] Procedure: 2D Echo, Cardiac Doppler and Color Doppler Indications:     Elevated troponin  History:         Patient has no prior history of Echocardiogram examinations.  COPD; Risk Factors:Hypertension.  Sonographer:     Sherrie Sport RDCS (AE) Referring Phys:  [id number] [facility] D [facility] Diagnosing Phys: Serafina Royals MD  Sonographer Comments: Technically challenging study due to limited acoustic windows, no apical window and suboptimal parasternal window. Image acquisition challenging due to patient body habitus and Image acquisition challenging due to COPD. IMPRESSIONS  1. Left ventricular ejection fraction, by estimation, is 60 to 65%. The left ventricle has normal function. The left ventricle has no regional wall motion abnormalities. Left ventricular diastolic parameters were normal.  2. Right ventricular systolic function is normal. The right ventricular size is normal.  3. The mitral valve is normal in structure. Mild mitral valve regurgitation.  4. The aortic valve is normal in structure. Aortic valve regurgitation is trivial. FINDINGS  Left Ventricle: Left ventricular ejection fraction, by estimation, is 60 to 65%. The left ventricle has normal function. The left ventricle has no regional wall motion abnormalities. The left ventricular internal cavity size was normal in size. There is  no left ventricular hypertrophy. Left ventricular diastolic parameters were  normal. Right Ventricle: The right ventricular size is normal. No increase in right ventricular wall thickness. Right ventricular systolic function is normal. Left Atrium: Left atrial size was normal in size. Right Atrium: Right atrial size was normal in size. Pericardium: There is no evidence of pericardial effusion. Mitral Valve: The mitral valve is normal in structure. Mild mitral valve regurgitation. Tricuspid Valve: The tricuspid valve is normal in structure. Tricuspid valve regurgitation is mild. Aortic Valve: The aortic valve is normal in structure. Aortic valve regurgitation is trivial. Pulmonic Valve: The pulmonic valve was not well visualized. Pulmonic valve regurgitation is not visualized. Aorta: The aortic root and ascending aorta are structurally normal, with no evidence of dilitation. IAS/Shunts: No atrial level shunt detected by color flow Doppler.  LEFT VENTRICLE PLAX 2D LVIDd:         3.83 cm LVIDs:         2.59 cm LV PW:         1.35 cm LV IVS:        1.03 cm LVOT diam:     2.10 cm LVOT Area:     3.46 cm  LEFT ATRIUM         Index LA diam:    3.80 cm 2.05 cm/m                        PULMONIC VALVE AORTA                 PV Vmax:        1.12 m/s Ao Root diam: 3.50 cm PV Peak grad:   5.0 mmHg                       RVOT Peak grad: 9 mmHg  TRICUSPID VALVE TR Peak grad:   7.2 mmHg TR Vmax:        134.00 cm/s  SHUNTS Systemic Diam: 2.10 cm Serafina Royals MD Electronically signed by Serafina Royals MD Signature Date/Time: 02/17/2021/1:27:15 PM    Final      Medications:    heparin 850 Units/hr (02/18/21 0414)    amoxicillin  500 mg Oral Q12H   aspirin  81 mg Oral Daily   atorvastatin  80 mg Oral Daily   calcitRIOL  0.25 mcg Oral Daily   Chlorhexidine Gluconate Cloth  6 each Topical Daily   cholecalciferol  1,000 Units  Oral Daily   citalopram  10 mg Oral Daily   clopidogrel  75 mg Oral Daily   finasteride  5 mg Oral Daily   latanoprost  1 drop Both Eyes QHS   melatonin  5 mg Oral QHS    metoprolol tartrate  25 mg Oral BID   nicotine  21 mg Transdermal Daily   pantoprazole  40 mg Oral Daily   tamsulosin  0.8 mg Oral Daily   traZODone  150 mg Oral QHS   acetaminophen **OR** acetaminophen, hydrALAZINE, magnesium hydroxide, ondansetron **OR** ondansetron (ZOFRAN) IV  Assessment/ Plan:  87 y.o. male with  HTN, Chronic low back pain, h/o alcoholism, Osteoarthritis, BPH, major depressive disorder, migtains  admitted on 02/15/2021 for Hypertensive urgency [I16.0] Hypertensive emergency [I16.1] Abdominal pain [R10.9] Chest pain [R07.9] Acute renal failure superimposed on stage 4 chronic kidney disease, unspecified acute renal failure type ([place]) [N17.9, N18.4]   #CKD st 4 Baseline Creatinine approx 3.2/GFR 19 from feb 2022 Current Cr of 3.14 which is close to baseline  # NSTEMI Currently on iv heparin; no chest pain Given advanced age, Advanced CKD and comorbidities, he would be at high risk of contrast ATN leading to dialysis Will need careful hydration pre-cath if family and patient want to proceed  # Hyperkalemia Recommend Low K  Add veltassa   LOS: 2 Kyle Stansell 7/19/20229:52 AM  [facility] [place], [place]  Note: This note was prepared with Dragon dictation. Any transcription errors are unintentional

## 2021-02-18 NOTE — Progress Notes (Signed)
Mobility Specialist - Progress Note   02/18/21 1000  Mobility  Activity Contraindicated/medical hold  Mobility performed by Mobility specialist    Per chart review, pt with elevated K+ of 5.8 this date contraindicating mobility. Will attempt session another date/time as medically appropriate.    Kathee Delton Mobility Specialist 02/18/21, 10:50 AM

## 2021-02-18 NOTE — Progress Notes (Signed)
OT Cancellation Note  Patient Details Name: ARNAZ SPRAUER MRN: [id number] DOB: 01/01/1934   Cancelled Treatment:    Reason Eval/Treat Not Completed: Medical issues which prohibited therapy. Per chart, pt noted with K+ 5.8 this morning. Per therapy guidelines, contraindicated for therapy participation. Will re-attempt at later date/time as medically appropriate.   Hanley Hays, MPH, MS, OTR/L ascom [phone] 02/18/21, 11:29 AM

## 2021-02-18 NOTE — Progress Notes (Signed)
Covid swab came back positive. Dr Roosevelt Locks notified of results. Aslo called daughter and gave her results.

## 2021-02-18 NOTE — Consult Note (Signed)
ANTICOAGULATION CONSULT NOTE - Initial Consult  Pharmacy Consult for heparin infusion Indication: chest pain/ACS  No Known Allergies  Patient Measurements: Height: '6\' 2"'$  (188 cm) Weight: 62.6 kg (138 lb 0.1 oz) IBW/kg (Calculated) : 82.2 Heparin Dosing Weight: 62.6 kg  Vital Signs: Temp: 97.6 F (36.4 C) (07/18 2138) Temp Source: Oral (07/18 1548) BP: 152/69 (07/18 2138) Pulse Rate: 73 (07/18 2138)  Labs: Recent Labs    02/15/21 1828 02/15/21 2106 02/16/21 0603 02/17/21 0545 02/17/21 0842 02/17/21 1049 02/18/21 0257  HGB 10.0*  --  10.4*  --   --   --  9.5*  HCT 31.5*  --  32.2*  --   --   --  28.9*  PLT 189  --  175  --   --   --  142*  HEPARINUNFRC  --   --   --   --   --   --  0.29*  CREATININE 3.70*  --  3.22*  --   --   --   --   TROPONINIHS 12   < > 15 1,076* 1,061* 966*  --    < > = values in this interval not displayed.     Estimated Creatinine Clearance: 14.9 mL/min (A) (by C-G formula based on SCr of 3.22 mg/dL (H)).   Medical History: Past Medical History:  Diagnosis Date   COPD (chronic obstructive pulmonary disease) (HCC)    Hypertension    Peripheral vascular disease (HCC)    Renal disorder    Stage 4 chronic kidney disease (HCC)     Medications:  Enoxaparin 30 mg (ppx) given 7/18 @ 0900   Assessment: 87 y/o male complains of persistent shortness of breath dyspnea weakness. Troponin elevated 15> 1076. Pharmacy has been consulted for initiation and management of heparin infusion for ACS.  Goal of Therapy:  Heparin level 0.3-0.7 units/ml Monitor platelets by anticoagulation protocol: Yes   Plan:  7/19:  HL @ 0257 = 0.29 Heparin 950 units IV X 1 bolus ordered followed by heparin 850 units/hr.   Will recheck HL 8 hrs after rate change.   Orene Desanctis, PharmD Clinical Pharmacist   02/18/2021,3:31 AM

## 2021-02-19 ENCOUNTER — Other Ambulatory Visit: Payer: Self-pay

## 2021-02-19 ENCOUNTER — Emergency Department: Payer: Medicare Other

## 2021-02-19 ENCOUNTER — Inpatient Hospital Stay (HOSPITAL_COMMUNITY)
Admission: EM | Admit: 2021-02-19 | Discharge: 2021-03-03 | Disposition: A | Discharge status: Skilled Nursing Facility | Payer: Medicare Other | Source: Home / Self Care | Attending: Internal Medicine | Admitting: Internal Medicine

## 2021-02-19 ENCOUNTER — Encounter: Payer: Self-pay | Admitting: *Deleted

## 2021-02-19 DIAGNOSIS — S022XXB Fracture of nasal bones, initial encounter for open fracture: Secondary | ICD-10-CM | POA: Diagnosis present

## 2021-02-19 DIAGNOSIS — Y92019 Unspecified place in single-family (private) house as the place of occurrence of the external cause: Secondary | ICD-10-CM

## 2021-02-19 DIAGNOSIS — Z79899 Other long term (current) drug therapy: Secondary | ICD-10-CM

## 2021-02-19 DIAGNOSIS — I2510 Atherosclerotic heart disease of native coronary artery without angina pectoris: Secondary | ICD-10-CM | POA: Diagnosis present

## 2021-02-19 DIAGNOSIS — J440 Chronic obstructive pulmonary disease with acute lower respiratory infection: Secondary | ICD-10-CM | POA: Diagnosis present

## 2021-02-19 DIAGNOSIS — F17200 Nicotine dependence, unspecified, uncomplicated: Secondary | ICD-10-CM | POA: Diagnosis present

## 2021-02-19 DIAGNOSIS — K219 Gastro-esophageal reflux disease without esophagitis: Secondary | ICD-10-CM | POA: Diagnosis present

## 2021-02-19 DIAGNOSIS — Z23 Encounter for immunization: Secondary | ICD-10-CM

## 2021-02-19 DIAGNOSIS — R2681 Unsteadiness on feet: Secondary | ICD-10-CM

## 2021-02-19 DIAGNOSIS — F17210 Nicotine dependence, cigarettes, uncomplicated: Secondary | ICD-10-CM | POA: Diagnosis present

## 2021-02-19 DIAGNOSIS — W19XXXA Unspecified fall, initial encounter: Secondary | ICD-10-CM | POA: Diagnosis not present

## 2021-02-19 DIAGNOSIS — Z7982 Long term (current) use of aspirin: Secondary | ICD-10-CM

## 2021-02-19 DIAGNOSIS — R531 Weakness: Secondary | ICD-10-CM | POA: Diagnosis present

## 2021-02-19 DIAGNOSIS — R338 Other retention of urine: Secondary | ICD-10-CM | POA: Diagnosis present

## 2021-02-19 DIAGNOSIS — Z634 Disappearance and death of family member: Secondary | ICD-10-CM

## 2021-02-19 DIAGNOSIS — I739 Peripheral vascular disease, unspecified: Secondary | ICD-10-CM | POA: Diagnosis present

## 2021-02-19 DIAGNOSIS — B952 Enterococcus as the cause of diseases classified elsewhere: Secondary | ICD-10-CM | POA: Diagnosis present

## 2021-02-19 DIAGNOSIS — N401 Enlarged prostate with lower urinary tract symptoms: Secondary | ICD-10-CM | POA: Diagnosis present

## 2021-02-19 DIAGNOSIS — I10 Essential (primary) hypertension: Secondary | ICD-10-CM

## 2021-02-19 DIAGNOSIS — W06XXXA Fall from bed, initial encounter: Secondary | ICD-10-CM | POA: Diagnosis present

## 2021-02-19 DIAGNOSIS — D6959 Other secondary thrombocytopenia: Secondary | ICD-10-CM | POA: Diagnosis present

## 2021-02-19 DIAGNOSIS — Z66 Do not resuscitate: Secondary | ICD-10-CM | POA: Diagnosis present

## 2021-02-19 DIAGNOSIS — N184 Chronic kidney disease, stage 4 (severe): Secondary | ICD-10-CM | POA: Diagnosis present

## 2021-02-19 DIAGNOSIS — U071 COVID-19: Secondary | ICD-10-CM | POA: Diagnosis present

## 2021-02-19 DIAGNOSIS — Z7902 Long term (current) use of antithrombotics/antiplatelets: Secondary | ICD-10-CM

## 2021-02-19 DIAGNOSIS — R001 Bradycardia, unspecified: Secondary | ICD-10-CM | POA: Diagnosis not present

## 2021-02-19 DIAGNOSIS — R197 Diarrhea, unspecified: Secondary | ICD-10-CM | POA: Diagnosis not present

## 2021-02-19 DIAGNOSIS — I129 Hypertensive chronic kidney disease with stage 1 through stage 4 chronic kidney disease, or unspecified chronic kidney disease: Secondary | ICD-10-CM | POA: Diagnosis present

## 2021-02-19 DIAGNOSIS — J441 Chronic obstructive pulmonary disease with (acute) exacerbation: Secondary | ICD-10-CM | POA: Diagnosis present

## 2021-02-19 DIAGNOSIS — N179 Acute kidney failure, unspecified: Secondary | ICD-10-CM | POA: Diagnosis not present

## 2021-02-19 DIAGNOSIS — E875 Hyperkalemia: Secondary | ICD-10-CM | POA: Diagnosis not present

## 2021-02-19 DIAGNOSIS — G9341 Metabolic encephalopathy: Secondary | ICD-10-CM | POA: Diagnosis present

## 2021-02-19 DIAGNOSIS — N390 Urinary tract infection, site not specified: Secondary | ICD-10-CM | POA: Diagnosis present

## 2021-02-19 DIAGNOSIS — D631 Anemia in chronic kidney disease: Secondary | ICD-10-CM | POA: Diagnosis present

## 2021-02-19 DIAGNOSIS — I214 Non-ST elevation (NSTEMI) myocardial infarction: Secondary | ICD-10-CM | POA: Diagnosis present

## 2021-02-19 LAB — CBC WITH DIFFERENTIAL/PLATELET
Abs Immature Granulocytes: 0.02 10*3/uL (ref 0.00–0.07)
Abs Immature Granulocytes: 0.03 10*3/uL (ref 0.00–0.07)
Basophils Absolute: 0 10*3/uL (ref 0.0–0.1)
Basophils Absolute: 0 10*3/uL (ref 0.0–0.1)
Basophils Relative: 0 %
Basophils Relative: 0 %
Eosinophils Absolute: 0 10*3/uL (ref 0.0–0.5)
Eosinophils Absolute: 0.1 10*3/uL (ref 0.0–0.5)
Eosinophils Relative: 1 %
Eosinophils Relative: 2 %
HCT: 25.8 % — ABNORMAL LOW (ref 39.0–52.0)
HCT: 30.1 % — ABNORMAL LOW (ref 39.0–52.0)
Hemoglobin: 8.5 g/dL — ABNORMAL LOW (ref 13.0–17.0)
Hemoglobin: 9.9 g/dL — ABNORMAL LOW (ref 13.0–17.0)
Immature Granulocytes: 0 %
Immature Granulocytes: 1 %
Lymphocytes Relative: 9 %
Lymphocytes Relative: 7 %
Lymphs Abs: 0.4 10*3/uL — ABNORMAL LOW (ref 0.7–4.0)
Lymphs Abs: 0.4 10*3/uL — ABNORMAL LOW (ref 0.7–4.0)
MCH: 32.7 pg (ref 26.0–34.0)
MCH: 32.6 pg (ref 26.0–34.0)
MCHC: 32.9 g/dL (ref 30.0–36.0)
MCHC: 32.9 g/dL (ref 30.0–36.0)
MCV: 99.2 fL (ref 80.0–100.0)
MCV: 99 fL (ref 80.0–100.0)
Monocytes Absolute: 0.5 10*3/uL (ref 0.1–1.0)
Monocytes Absolute: 0.5 10*3/uL (ref 0.1–1.0)
Monocytes Relative: 10 %
Monocytes Relative: 8 %
Neutro Abs: 3.6 10*3/uL (ref 1.7–7.7)
Neutro Abs: 4.9 10*3/uL (ref 1.7–7.7)
Neutrophils Relative %: 80 %
Neutrophils Relative %: 82 %
Platelets: 121 10*3/uL — ABNORMAL LOW (ref 150–400)
Platelets: 124 10*3/uL — ABNORMAL LOW (ref 150–400)
RBC: 2.6 MIL/uL — ABNORMAL LOW (ref 4.22–5.81)
RBC: 3.04 MIL/uL — ABNORMAL LOW (ref 4.22–5.81)
RDW: 14.3 % (ref 11.5–15.5)
RDW: 14.2 % (ref 11.5–15.5)
WBC: 4.6 10*3/uL (ref 4.0–10.5)
WBC: 6 10*3/uL (ref 4.0–10.5)
nRBC: 0 % (ref 0.0–0.2)
nRBC: 0 % (ref 0.0–0.2)

## 2021-02-19 LAB — BASIC METABOLIC PANEL
Anion gap: 3 — ABNORMAL LOW (ref 5–15)
BUN: 32 mg/dL — ABNORMAL HIGH (ref 8–23)
CO2: 23 mmol/L (ref 22–32)
Calcium: 8 mg/dL — ABNORMAL LOW (ref 8.9–10.3)
Chloride: 111 mmol/L (ref 98–111)
Creatinine, Ser: 3.11 mg/dL — ABNORMAL HIGH (ref 0.61–1.24)
GFR, Estimated: 19 mL/min — ABNORMAL LOW (ref >60)
Glucose, Bld: 99 mg/dL (ref 70–99)
Potassium: 3.8 mmol/L (ref 3.5–5.1)
Sodium: 137 mmol/L (ref 135–145)

## 2021-02-19 LAB — COMPREHENSIVE METABOLIC PANEL
ALT: 13 U/L (ref 0–44)
AST: 34 U/L (ref 15–41)
Albumin: 3.3 g/dL — ABNORMAL LOW (ref 3.5–5.0)
Alkaline Phosphatase: 57 U/L (ref 38–126)
Anion gap: 11 (ref 5–15)
BUN: 32 mg/dL — ABNORMAL HIGH (ref 8–23)
CO2: 19 mmol/L — ABNORMAL LOW (ref 22–32)
Calcium: 8.6 mg/dL — ABNORMAL LOW (ref 8.9–10.3)
Chloride: 108 mmol/L (ref 98–111)
Creatinine, Ser: 3.16 mg/dL — ABNORMAL HIGH (ref 0.61–1.24)
GFR, Estimated: 19 mL/min — ABNORMAL LOW (ref >60)
Glucose, Bld: 105 mg/dL — ABNORMAL HIGH (ref 70–99)
Potassium: 4 mmol/L (ref 3.5–5.1)
Sodium: 138 mmol/L (ref 135–145)
Total Bilirubin: 1 mg/dL (ref 0.3–1.2)
Total Protein: 6.5 g/dL (ref 6.5–8.1)

## 2021-02-19 LAB — GLUCOSE, CAPILLARY: Glucose-Capillary: 104 mg/dL — ABNORMAL HIGH (ref 70–99)

## 2021-02-19 LAB — HEPARIN LEVEL (UNFRACTIONATED): Heparin Unfractionated: 0.21 IU/mL — ABNORMAL LOW (ref 0.30–0.70)

## 2021-02-19 MED ORDER — CLOPIDOGREL BISULFATE 75 MG PO TABS: 75.0000 mg | ORAL_TABLET | Freq: Every day | ORAL | 1 refills | Status: DC

## 2021-02-19 MED ORDER — METOPROLOL TARTRATE 25 MG PO TABS
25.0000 mg | ORAL_TABLET | Freq: Two times a day (BID) | ORAL | Status: DC
  Administered 2021-02-20 – 2021-02-26 (×10): 25 mg via ORAL
  Filled 2021-02-19 (×14): qty 1

## 2021-02-19 MED ORDER — CLOPIDOGREL BISULFATE 75 MG PO TABS
75.0000 mg | ORAL_TABLET | Freq: Every day | ORAL | Status: DC
  Administered 2021-02-22 – 2021-03-03 (×10): 75 mg via ORAL
  Filled 2021-02-19 (×11): qty 1

## 2021-02-19 MED ORDER — TRAZODONE HCL 50 MG PO TABS
150.0000 mg | ORAL_TABLET | Freq: Every day | ORAL | Status: DC
  Administered 2021-02-20 – 2021-03-02 (×11): 150 mg via ORAL
  Filled 2021-02-19 (×11): qty 1

## 2021-02-19 MED ORDER — FINASTERIDE 5 MG PO TABS
5.0000 mg | ORAL_TABLET | Freq: Every day | ORAL | Status: DC
  Administered 2021-02-22 – 2021-03-03 (×10): 5 mg via ORAL
  Filled 2021-02-19 (×11): qty 1

## 2021-02-19 MED ORDER — VITAMIN D 25 MCG (1000 UNIT) PO TABS
1000.0000 [IU] | ORAL_TABLET | Freq: Every day | ORAL | Status: DC
  Administered 2021-02-22 – 2021-03-03 (×10): 1000 [IU] via ORAL
  Filled 2021-02-19 (×11): qty 1

## 2021-02-19 MED ORDER — AMOXICILLIN 500 MG PO CAPS: 500.0000 mg | ORAL_CAPSULE | Freq: Two times a day (BID) | ORAL | 0 refills | Status: DC

## 2021-02-19 MED ORDER — TAMSULOSIN HCL 0.4 MG PO CAPS
0.8000 mg | ORAL_CAPSULE | Freq: Every day | ORAL | Status: DC
  Administered 2021-02-22 – 2021-03-03 (×10): 0.8 mg via ORAL
  Filled 2021-02-19 (×11): qty 2

## 2021-02-19 MED ORDER — ACETAMINOPHEN 650 MG RE SUPP: 650.0000 mg | Freq: Four times a day (QID) | RECTAL | Status: DC | PRN

## 2021-02-19 MED ORDER — ACETAMINOPHEN 325 MG PO TABS
650.0000 mg | ORAL_TABLET | Freq: Four times a day (QID) | ORAL | Status: DC | PRN
  Administered 2021-02-22 – 2021-02-25 (×2): 650 mg via ORAL
  Filled 2021-02-19 (×2): qty 2

## 2021-02-19 MED ORDER — TETANUS-DIPHTH-ACELL PERTUSSIS 5-2.5-18.5 LF-MCG/0.5 IM SUSY
0.5000 mL | PREFILLED_SYRINGE | Freq: Once | INTRAMUSCULAR | Status: AC
  Administered 2021-02-19: 0.5 mL via INTRAMUSCULAR
  Filled 2021-02-19: qty 0.5

## 2021-02-19 MED ORDER — CEFAZOLIN SODIUM-DEXTROSE 1-4 GM/50ML-% IV SOLN
1.0000 g | Freq: Once | INTRAVENOUS | Status: AC
  Administered 2021-02-19: 1 g via INTRAVENOUS
  Filled 2021-02-19: qty 50

## 2021-02-19 MED ORDER — ATORVASTATIN CALCIUM 20 MG PO TABS
80.0000 mg | ORAL_TABLET | Freq: Every day | ORAL | Status: DC
  Administered 2021-02-22 – 2021-03-03 (×10): 80 mg via ORAL
  Filled 2021-02-19 (×11): qty 4

## 2021-02-19 MED ORDER — BEBTELOVIMAB 175 MG/2 ML IV (EUA)
175.0000 mg | Freq: Once | INTRAMUSCULAR | Status: AC
  Administered 2021-02-19: 175 mg via INTRAVENOUS
  Filled 2021-02-19: qty 2

## 2021-02-19 MED ORDER — ONDANSETRON HCL 4 MG/2ML IJ SOLN: 4.0000 mg | Freq: Four times a day (QID) | INTRAMUSCULAR | Status: DC | PRN

## 2021-02-19 MED ORDER — ONDANSETRON HCL 4 MG PO TABS: 4.0000 mg | ORAL_TABLET | Freq: Four times a day (QID) | ORAL | Status: DC | PRN

## 2021-02-19 MED ORDER — DIPHENHYDRAMINE HCL 50 MG/ML IJ SOLN: 50.0000 mg | Freq: Once | INTRAMUSCULAR | Status: DC | PRN

## 2021-02-19 MED ORDER — TRIMETHOPRIM 100 MG PO TABS
50.0000 mg | ORAL_TABLET | Freq: Every day | ORAL | Status: DC
  Filled 2021-02-19: qty 1

## 2021-02-19 MED ORDER — ALBUTEROL SULFATE HFA 108 (90 BASE) MCG/ACT IN AERS
2.0000 | INHALATION_SPRAY | Freq: Once | RESPIRATORY_TRACT | Status: DC | PRN
  Filled 2021-02-19: qty 6.7

## 2021-02-19 MED ORDER — METHYLPREDNISOLONE SODIUM SUCC 125 MG IJ SOLR: 125.0000 mg | Freq: Once | INTRAMUSCULAR | Status: DC | PRN

## 2021-02-19 MED ORDER — PANTOPRAZOLE SODIUM 40 MG PO TBEC
40.0000 mg | DELAYED_RELEASE_TABLET | Freq: Every day | ORAL | Status: DC
  Administered 2021-02-22 – 2021-03-03 (×10): 40 mg via ORAL
  Filled 2021-02-19 (×11): qty 1

## 2021-02-19 MED ORDER — ASPIRIN EC 81 MG PO TBEC
81.0000 mg | DELAYED_RELEASE_TABLET | Freq: Every day | ORAL | Status: DC
  Administered 2021-02-22 – 2021-03-03 (×10): 81 mg via ORAL
  Filled 2021-02-19 (×11): qty 1

## 2021-02-19 MED ORDER — FAMOTIDINE IN NACL 20-0.9 MG/50ML-% IV SOLN: 20.0000 mg | Freq: Once | INTRAVENOUS | Status: DC | PRN

## 2021-02-19 MED ORDER — FAMOTIDINE 20 MG PO TABS: 20.0000 mg | ORAL_TABLET | Freq: Two times a day (BID) | ORAL | Status: DC

## 2021-02-19 MED ORDER — ATORVASTATIN CALCIUM 80 MG PO TABS
80.0000 mg | ORAL_TABLET | Freq: Every day | ORAL | 1 refills | Status: DC
Start: 2021-02-20 — End: 2021-04-14

## 2021-02-19 MED ORDER — HEPARIN BOLUS VIA INFUSION
900.0000 [IU] | Freq: Once | INTRAVENOUS | Status: AC
  Administered 2021-02-19: 900 [IU] via INTRAVENOUS
  Filled 2021-02-19: qty 900

## 2021-02-19 MED ORDER — ENOXAPARIN SODIUM 30 MG/0.3ML IJ SOSY: 30.0000 mg | PREFILLED_SYRINGE | INTRAMUSCULAR | Status: DC

## 2021-02-19 MED ORDER — LATANOPROST 0.005 % OP SOLN
1.0000 [drp] | Freq: Every day | OPHTHALMIC | Status: DC
  Administered 2021-02-20 – 2021-03-02 (×11): 1 [drp] via OPHTHALMIC
  Filled 2021-02-19: qty 2.5

## 2021-02-19 MED ORDER — ASPIRIN 81 MG PO TBEC: 81.0000 mg | DELAYED_RELEASE_TABLET | Freq: Every day | ORAL | 1 refills | Status: DC

## 2021-02-19 MED ORDER — CALCITRIOL 0.25 MCG PO CAPS
0.2500 ug | ORAL_CAPSULE | Freq: Every day | ORAL | Status: DC
  Administered 2021-02-22 – 2021-03-03 (×10): 0.25 ug via ORAL
  Filled 2021-02-19 (×12): qty 1

## 2021-02-19 MED ORDER — METOPROLOL TARTRATE 25 MG PO TABS: 25.0000 mg | ORAL_TABLET | Freq: Two times a day (BID) | ORAL | 1 refills | Status: DC

## 2021-02-19 MED ORDER — FUROSEMIDE 40 MG PO TABS
40.0000 mg | ORAL_TABLET | Freq: Every day | ORAL | Status: DC
  Administered 2021-02-22 – 2021-02-23 (×2): 40 mg via ORAL
  Filled 2021-02-19 (×2): qty 1
  Filled 2021-02-19: qty 2

## 2021-02-19 MED ORDER — CITALOPRAM HYDROBROMIDE 20 MG PO TABS
10.0000 mg | ORAL_TABLET | Freq: Every day | ORAL | Status: DC
  Administered 2021-02-22 – 2021-03-03 (×10): 10 mg via ORAL
  Filled 2021-02-19 (×11): qty 1

## 2021-02-19 MED ORDER — SODIUM CHLORIDE 0.9 % IV SOLN: INTRAVENOUS | Status: DC | PRN

## 2021-02-19 MED ORDER — AMOXICILLIN 500 MG PO CAPS
500.0000 mg | ORAL_CAPSULE | Freq: Two times a day (BID) | ORAL | Status: AC
  Administered 2021-02-20 – 2021-02-24 (×8): 500 mg via ORAL
  Filled 2021-02-19 (×11): qty 1

## 2021-02-19 MED ORDER — EPINEPHRINE 0.3 MG/0.3ML IJ SOAJ
0.3000 mg | Freq: Once | INTRAMUSCULAR | Status: DC | PRN
  Filled 2021-02-19: qty 0.3

## 2021-02-19 NOTE — H&P (Signed)
History and Physical   Max Ellis [id number] DOB: [birth year] DOA: 02/19/2021  Referring MD/NP/PA: Dr. Tamala Julian  PCP: [place] Pink, MD   Outpatient Specialists: None  Patient coming from: Home  Chief Complaint: Fall  HPI: Max Ellis is a 87 y.o. male with medical history significant of COPD, essential hypertension, peripheral vascular disease, chronic kidney disease stage IV, recent non-ST elevation MI as well as hypertensive urgency who was discharged home today following a diagnosis of COVID-19 yesterday.  Patient was evaluated by PT and OT.  There was recommendation for discharge to skilled facility.  He was also seen by palliative care while in the hospital with goals of care established.  Patient and his daughter opted for DNR/DNI but also home with home health rather than skilled facility.  Patient reportedly got home and try to get of out of bed quickly.  He sustained a fall where he actually struck his head.  No loss of consciousness.  He has been feeling unsteady since then.  Denied any other significant injury but has facial injuries noted in the ER.  He has had UTI while in the hospital for which he has been on amoxicillin.  Patient remains relatively asymptomatic from his COVID-19 infection.  Patient and family are now interested in skilled nursing placement.  He is therefore being admitted to the hospital for evaluation treatment of possible placement..  ED Course: Temperature 100.1 blood pressure 119/63 pulse 80 respirate of 20 oxygen sat 98% on room air.  White count 6.0 hemoglobin 9.9 and platelets 124.  Sodium 138 potassium 4.0 chloride 108 CO2 19 BUN 30 creatinine 3.16 and calcium 8.6.  Head CT without contrast showed no acute findings.  CT cervical spine and maxillofacial all were unrevealing.  Chest x-ray showed no acute findings.  Patient is being readmitted to the hospital for fall.  He has failed home therapy and will likely need to go to skilled facility as previously  recommended.  Review of Systems: As per HPI otherwise 10 point review of systems negative.    Past Medical History:  Diagnosis Date   COPD (chronic obstructive pulmonary disease) ([place])    Hypertension    Peripheral vascular disease ([place])    Renal disorder    Stage 4 chronic kidney disease (HCC)     No past surgical history on file.   reports that he has been smoking cigarettes. He has quit using smokeless tobacco.  His smokeless tobacco use included chew. He reports that he does not drink alcohol and does not use drugs.  No Known Allergies  No family history on file.   Prior to Admission medications   Medication Sig Start Date End Date Taking? Authorizing Provider  amoxicillin (AMOXIL) 500 MG capsule Take 1 capsule (500 mg total) by mouth every 12 (twelve) hours for 5 days. 02/19/21 02/24/21 Yes Patrecia Pour, MD  aspirin (ASPIRIN 81) 81 MG EC tablet Take 1 tablet (81 mg total) by mouth daily. 02/19/21  Yes Patrecia Pour, MD  atorvastatin (LIPITOR) 80 MG tablet Take 1 tablet (80 mg total) by mouth daily. 02/20/21  Yes Patrecia Pour, MD  calcitRIOL (ROCALTROL) 0.25 MCG capsule Take 0.25 mcg by mouth daily. 09/18/19  Yes [provider]  cholecalciferol (VITAMIN D3) 25 MCG (1000 UT) tablet Take 1,000 Units by mouth daily.   Yes [provider]  citalopram (CELEXA) 10 MG tablet Take 10 mg by mouth daily.   Yes [provider]  clopidogrel (PLAVIX) 75 MG  tablet Take 1 tablet (75 mg total) by mouth daily. 02/20/21  Yes Patrecia Pour, MD  famotidine (PEPCID) 20 MG tablet Take by mouth. 02/26/20 02/25/21 Yes [provider]  finasteride (PROSCAR) 5 MG tablet Take 5 mg by mouth daily. 12/30/16  Yes [provider]  furosemide (LASIX) 40 MG tablet Take 40 mg by mouth daily. 12/29/20  Yes [provider]  latanoprost (XALATAN) 0.005 % ophthalmic solution Place 1 drop into both eyes at bedtime. 02/15/20  Yes [provider]  metoprolol  tartrate (LOPRESSOR) 25 MG tablet Take 1 tablet (25 mg total) by mouth 2 (two) times daily. 02/19/21  Yes Patrecia Pour, MD  omeprazole (PRILOSEC) 20 MG capsule Take 20 mg by mouth daily.   Yes [provider]  tamsulosin (FLOMAX) 0.4 MG CAPS capsule Take 0.8 mg by mouth daily.   Yes [provider]  traZODone (DESYREL) 50 MG tablet Take 150 mg by mouth at bedtime.   Yes [provider]  trimethoprim (TRIMPEX) 100 MG tablet Take 0.5 tablets (50 mg total) by mouth daily. 01/09/21 04/09/21 Yes Debroah Loop, PA-C    Physical Exam: Vitals:   02/19/21 2240 02/19/21 2308 02/19/21 2315 02/19/21 2330  BP: (!) 199/63 (!) 153/91  (!) 191/67  Pulse: 81 73 73 78  Resp: '15 19 14 17  '$ Temp:      TempSrc:      SpO2: 99% 100% 98% 98%  Weight:      Height:          Constitutional: Chronically ill looking, frail, no distress Vitals:   02/19/21 2240 02/19/21 2308 02/19/21 2315 02/19/21 2330  BP: (!) 199/63 (!) 153/91  (!) 191/67  Pulse: 81 73 73 78  Resp: '15 19 14 17  '$ Temp:      TempSrc:      SpO2: 99% 100% 98% 98%  Weight:      Height:       Eyes: PERRL, lids and conjunctivae normal ENMT: Mucous membranes are dry. Posterior pharynx clear of any exudate or lesions.Normal dentition.  Neck: normal, supple, no masses, no thyromegaly Respiratory: clear to auscultation bilaterally, no wheezing, no crackles. Normal respiratory effort. No accessory muscle use.  Cardiovascular: Regular rate and rhythm, no murmurs / rubs / gallops. No extremity edema. 2+ pedal pulses. No carotid bruits.  Abdomen: no tenderness, no masses palpated. No hepatosplenomegaly. Bowel sounds positive.  Musculoskeletal: no clubbing / cyanosis. No joint deformity upper and lower extremities. Good ROM, no contractures. Normal muscle tone.  Skin: no rashes, lesions, ulcers.  Small laceration over the left nasal bridge no bony damage Neurologic: CN 2-12 grossly intact. Sensation intact, DTR normal.  Strength 5/5 in all 4.  Psychiatric: Normal judgment and insight. Alert and oriented x 3. Normal mood.     Labs on Admission: I have personally reviewed following labs and imaging studies  CBC: Recent Labs  Lab 02/15/21 1828 02/16/21 0603 02/18/21 0257 02/19/21 0616 02/19/21 2231  WBC 7.1 7.8 5.8 4.6 6.0  NEUTROABS  --   --   --  3.6 4.9  HGB 10.0* 10.4* 9.5* 8.5* 9.9*  HCT 31.5* 32.2* 28.9* 25.8* 30.1*  MCV 102.3* 102.2* 101.0* 99.2 99.0  PLT 189 175 142* 121* [id number]*   Basic Metabolic Panel: Recent Labs  Lab 02/15/21 1828 02/16/21 0603 02/18/21 0257 02/18/21 1220 02/19/21 0616 02/19/21 2231  NA 140 139 138  --  137 138  K 5.4* 5.0 5.8* 5.0 3.8 4.0  CL 110  109 110  --  111 108  CO2 '25 24 22  '$ --  23 19*  GLUCOSE 108* 89 77  --  99 105*  BUN 42* 39* 38*  --  32* 32*  CREATININE 3.70* 3.22* 3.14*  --  3.11* 3.16*  CALCIUM 9.0 8.9 8.5*  --  8.0* 8.6*  MG  --   --  2.1  --   --   --    GFR: Estimated Creatinine Clearance: 15.4 mL/min (A) (by C-G formula based on SCr of 3.16 mg/dL (H)). Liver Function Tests: Recent Labs  Lab 02/19/21 2231  AST 34  ALT 13  ALKPHOS 57  BILITOT 1.0  PROT 6.5  ALBUMIN 3.3*   No results for input(s): LIPASE, AMYLASE in the last 168 hours. No results for input(s): AMMONIA in the last 168 hours. Coagulation Profile: No results for input(s): INR, PROTIME in the last 168 hours. Cardiac Enzymes: No results for input(s): CKTOTAL, CKMB, CKMBINDEX, TROPONINI in the last 168 hours. BNP (last 3 results) No results for input(s): PROBNP in the last 8760 hours. HbA1C: No results for input(s): HGBA1C in the last 72 hours. CBG: Recent Labs  Lab 02/19/21 0750  GLUCAP 104*   Lipid Profile: No results for input(s): CHOL, HDL, LDLCALC, TRIG, CHOLHDL, LDLDIRECT in the last 72 hours. Thyroid Function Tests: No results for input(s): TSH, T4TOTAL, FREET4, T3FREE, THYROIDAB in the last 72 hours. Anemia Panel: No results for input(s): VITAMINB12,  FOLATE, FERRITIN, TIBC, IRON, RETICCTPCT in the last 72 hours. Urine analysis:    Component Value Date/Time   COLORURINE YELLOW (A) 02/16/2021 1246   APPEARANCEUR CLEAR (A) 02/16/2021 1246   APPEARANCEUR Clear 01/02/2021 1358   LABSPEC 1.013 02/16/2021 1246   PHURINE 6.0 02/16/2021 1246   GLUCOSEU NEGATIVE 02/16/2021 1246   HGBUR NEGATIVE 02/16/2021 1246   BILIRUBINUR NEGATIVE 02/16/2021 1246   BILIRUBINUR Negative 01/02/2021 1358   KETONESUR NEGATIVE 02/16/2021 1246   PROTEINUR NEGATIVE 02/16/2021 1246   NITRITE NEGATIVE 02/16/2021 1246   LEUKOCYTESUR TRACE (A) 02/16/2021 1246   Sepsis Labs: '@LABRCNTIP'$ (procalcitonin:4,lacticidven:4) ) Recent Results (from the past 240 hour(s))  Resp Panel by RT-PCR (Flu A&B, Covid) Nasopharyngeal Swab     Status: None   Collection Time: 02/16/21  2:56 AM   Specimen: Nasopharyngeal Swab; Nasopharyngeal(NP) swabs in vial transport medium  Result Value Ref Range Status   SARS Coronavirus 2 by RT PCR NEGATIVE NEGATIVE Final    Comment: (NOTE) SARS-CoV-2 target nucleic acids are NOT DETECTED.  The SARS-CoV-2 RNA is generally detectable in upper respiratory specimens during the acute phase of infection. The lowest concentration of SARS-CoV-2 viral copies this assay can detect is 138 copies/mL. A negative result does not preclude SARS-Cov-2 infection and should not be used as the sole basis for treatment or other patient management decisions. A negative result may occur with  improper specimen collection/handling, submission of specimen other than nasopharyngeal swab, presence of viral mutation(s) within the areas targeted by this assay, and inadequate number of viral copies(<138 copies/mL). A negative result must be combined with clinical observations, patient history, and epidemiological information. The expected result is Negative.  Fact Sheet for Patients:  EntrepreneurPulse.com.au  Fact Sheet for Healthcare Providers:   IncredibleEmployment.be  This test is no t yet approved or cleared by the [place] FDA and  has been authorized for detection and/or diagnosis of SARS-CoV-2 by FDA under an Emergency Use Authorization (EUA). This EUA will remain  in effect (meaning this test can be used) for  the duration of the COVID-19 declaration under Section 564(b)(1) of the Act, 21 U.S.C.section 360bbb-3(b)(1), unless the authorization is terminated  or revoked sooner.       Influenza A by PCR NEGATIVE NEGATIVE Final   Influenza B by PCR NEGATIVE NEGATIVE Final    Comment: (NOTE) The Xpert Xpress SARS-CoV-2/FLU/RSV plus assay is intended as an aid in the diagnosis of influenza from Nasopharyngeal swab specimens and should not be used as a sole basis for treatment. Nasal washings and aspirates are unacceptable for Xpert Xpress SARS-CoV-2/FLU/RSV testing.  Fact Sheet for Patients: EntrepreneurPulse.com.au  Fact Sheet for Healthcare Providers: IncredibleEmployment.be  This test is not yet approved or cleared by the [place] FDA and has been authorized for detection and/or diagnosis of SARS-CoV-2 by FDA under an Emergency Use Authorization (EUA). This EUA will remain in effect (meaning this test can be used) for the duration of the COVID-19 declaration under Section 564(b)(1) of the Act, 21 U.S.C. section 360bbb-3(b)(1), unless the authorization is terminated or revoked.  Performed at [facility], [address] [place], [place] [address]   Urine Culture     Status: Abnormal   Collection Time: 02/16/21 12:46 PM   Specimen: Urine, Clean Catch  Result Value Ref Range Status   Specimen Description   Final    URINE, CLEAN CATCH Performed at [facility], [address], [place], [place] [address]    Special Requests   Final    NONE Performed at [facility], [place], [place] [address]     Culture 30,000 COLONIES/mL ENTEROCOCCUS FAECALIS (A)  Final   Report Status 02/18/2021 FINAL  Final   Organism ID, Bacteria ENTEROCOCCUS FAECALIS (A)  Final      Susceptibility   Enterococcus faecalis - MIC*    AMPICILLIN <=2 SENSITIVE Sensitive     NITROFURANTOIN <=16 SENSITIVE Sensitive     VANCOMYCIN <=0.5 SENSITIVE Sensitive     * 30,000 COLONIES/mL ENTEROCOCCUS FAECALIS  Resp Panel by RT-PCR (Flu A&B, Covid) Nasopharyngeal Swab     Status: Abnormal   Collection Time: 02/18/21 10:20 AM   Specimen: Nasopharyngeal Swab; Nasopharyngeal(NP) swabs in vial transport medium  Result Value Ref Range Status   SARS Coronavirus 2 by RT PCR POSITIVE (A) NEGATIVE Final    Comment: RESULT CALLED TO, READ BACK BY AND VERIFIED WITH: Matilde Sprang, RN [id number]. 02/18/2021 GAA (NOTE) SARS-CoV-2 target nucleic acids are DETECTED.  The SARS-CoV-2 RNA is generally detectable in upper respiratory specimens during the acute phase of infection. Positive results are indicative of the presence of the identified virus, but do not rule out bacterial infection or co-infection with other pathogens not detected by the test. Clinical correlation with patient history and other diagnostic information is necessary to determine patient infection status. The expected result is Negative.  Fact Sheet for Patients: EntrepreneurPulse.com.au  Fact Sheet for Healthcare Providers: IncredibleEmployment.be  This test is not yet approved or cleared by the [place] FDA and  has been authorized for detection and/or diagnosis of SARS-CoV-2 by FDA under an Emergency Use Authorization (EUA).  This EUA will remain in effect (meaning this test can  be used) for the duration of  the COVID-19 declaration under Section 564(b)(1) of the Act, 21 U.S.C. section 360bbb-3(b)(1), unless the authorization is terminated or revoked sooner.     Influenza A by PCR NEGATIVE NEGATIVE Final   Influenza B by  PCR NEGATIVE NEGATIVE Final    Comment: (NOTE) The Xpert Xpress SARS-CoV-2/FLU/RSV plus  assay is intended as an aid in the diagnosis of influenza from Nasopharyngeal swab specimens and should not be used as a sole basis for treatment. Nasal washings and aspirates are unacceptable for Xpert Xpress SARS-CoV-2/FLU/RSV testing.  Fact Sheet for Patients: EntrepreneurPulse.com.au  Fact Sheet for Healthcare Providers: IncredibleEmployment.be  This test is not yet approved or cleared by the [place] FDA and has been authorized for detection and/or diagnosis of SARS-CoV-2 by FDA under an Emergency Use Authorization (EUA). This EUA will remain in effect (meaning this test can be used) for the duration of the COVID-19 declaration under Section 564(b)(1) of the Act, 21 U.S.C. section 360bbb-3(b)(1), unless the authorization is terminated or revoked.  Performed at [facility], [address] [place], [place] [address]      Radiological Exams on Admission: CT Head Wo Contrast  Result Date: 02/19/2021 CLINICAL DATA:  [place], COVID-19 positive, hit head EXAM: CT HEAD WITHOUT CONTRAST TECHNIQUE: Contiguous axial images were obtained from the base of the skull through the vertex without intravenous contrast. COMPARISON:  None. FINDINGS: Brain: Confluent hypodensities throughout the periventricular and subcortical white matter are most consistent with chronic small vessel ischemic changes. 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 No signs of acute infarct or hemorrhage. There is diffuse cerebral atrophy with ex vacuo dilatation of the lateral ventricles. Midline structures are unremarkable. No acute extra-axial fluid collections. No mass effect. Vascular: Extensive atherosclerosis of the internal carotid arteries. No hyperdense vessel. Skull: Normal. Negative for fracture or focal lesion. Sinuses/Orbits: No acute finding. Other: None. IMPRESSION: 1.  Chronic ischemic changes as above. No acute intracranial process. Electronically Signed   By: Randa Ngo M.D.   On: 02/19/2021 20:50   CT Cervical Spine Wo Contrast  Result Date: 02/19/2021 CLINICAL DATA:  [place], COVID-19 positive EXAM: CT CERVICAL SPINE WITHOUT CONTRAST TECHNIQUE: Multidetector CT imaging of the cervical spine was performed without intravenous contrast. Multiplanar CT image reconstructions were also generated. COMPARISON:  None. FINDINGS: Alignment: Left convex curvature of the cervical spine. Otherwise alignment is anatomic. Skull base and vertebrae: No acute fracture. No primary bone lesion or focal pathologic process. Soft tissues and spinal canal: No prevertebral fluid or swelling. No visible canal hematoma. Disc levels: Multilevel cervical spondylosis and facet hypertrophy, with disc space narrowing most pronounced at C4-5, C5-6, and C6-7. Upper chest: Stable pleural and parenchymal density at the right apex consistent with chronic scarring, without significant change since chest CT 04/13/2019. No acute airspace disease. Central airway is patent. Other: Reconstructed images demonstrate no additional findings. IMPRESSION: 1. No acute cervical spine fracture. 2. Multilevel cervical spondylosis and facet hypertrophy as above. 3. Chronic right apical pleural and parenchymal scarring. Electronically Signed   By: Randa Ngo M.D.   On: 02/19/2021 20:52   DG Chest Portable 1 View  Result Date: 02/19/2021 CLINICAL DATA:  Fall. Fall at home. History of frequent falls. COVID positive 2 days ago. EXAM: PORTABLE CHEST 1 VIEW COMPARISON:  Radiograph 4 days ago 02/15/2021, CT 3 days ago 02/16/2021. FINDINGS: Right apical pleuroparenchymal scarring. Subsegmental atelectasis at the left lung base. Stable heart size and mediastinal contours. Aortic atherosclerosis. There is no pulmonary edema, pleural effusion, or pneumothorax. No acute osseous abnormalities are seen. IMPRESSION: No acute  abnormality. Electronically Signed   By: Keith Rake M.D.   On: 02/19/2021 22:30   CT MAXILLOFACIAL WO CONTRAST  Result Date: 02/19/2021 CLINICAL DATA:  [place], COVID-19 positive, hit head EXAM: CT MAXILLOFACIAL WITHOUT CONTRAST TECHNIQUE: Multidetector CT imaging of the  maxillofacial structures was performed. Multiplanar CT image reconstructions were also generated. COMPARISON:  None. FINDINGS: Osseous: There is a minimally displaced left-sided nasal bone fracture, with overlying soft tissue swelling. No other acute facial bone fractures are identified. Orbits: Negative. No traumatic or inflammatory finding. Sinuses: Clear. Soft tissues: Prominent soft tissue swelling along the left aspect of the nasal bridge extending into the left infraorbital region. Remaining soft tissues are unremarkable. Limited intracranial: No significant or unexpected finding. IMPRESSION: 1. Minimally displaced left-sided nasal bone fracture with overlying soft tissue swelling. Electronically Signed   By: Randa Ngo M.D.   On: 02/19/2021 20:54      Assessment/Plan Principal Problem:   Fall Active Problems:   Essential hypertension   Tobacco use disorder   Chronic kidney disease, stage 4 (severe) (HCC)   NSTEMI (non-ST elevated myocardial infarction) ([place])   COVID-19 virus infection   Acute lower UTI     #1 status post fall: Appears to be mechanical.  Patient is unsteady on his feet.  He reportedly had a fall over 2 weeks ago.  We will admit the patient.  Continue PT and OT.  Case management with engagement for possible placement in skilled facility.  #2 COVID-19 infection: Largely asymptomatic.  Patient may be a good candidate of paxlovid.  Continue monitoring.  Remdesivir will be the other option.  #3 chronic kidney disease stage IV: At baseline.  #4 recent non-ST elevation MI: On medical management  #5 recent UTI with urinary retention: Continue oral ampicillin from home  #6 COPD: No acute  exacerbation.  Continue home regimen  #7 tobacco abuse: Counseling with nicotine patch   DVT prophylaxis: Lovenox Code Status: DNR Family Communication: Daughter over the phone Disposition Plan: Probably skilled facility now Consults called: None Admission status: Inpatient  Severity of Illness: The appropriate patient status for this patient is INPATIENT. Inpatient status is judged to be reasonable and necessary in order to provide the required intensity of service to ensure the patient's safety. The patient's presenting symptoms, physical exam findings, and initial radiographic and laboratory data in the context of their chronic comorbidities is felt to place them at high risk for further clinical deterioration. Furthermore, it is not anticipated that the patient will be medically stable for discharge from the hospital within 2 midnights of admission. The following factors support the patient status of inpatient.   " The patient's presenting symptoms include fall. " The worrisome physical exam findings include poor gauge. " The initial radiographic and laboratory data are worrisome because of no observed abnormalities. " The chronic co-morbidities include gait abnormalities.   * I certify that at the point of admission it is my clinical judgment that the patient will require inpatient hospital care spanning beyond 2 midnights from the point of admission due to high intensity of service, high risk for further deterioration and high frequency of surveillance required.Barbette Merino MD [facility] Pager [phone]  If 7PM-7AM, please contact night-coverage [email or web address] Password [email or web address]  02/19/2021, 11:59 PM

## 2021-02-19 NOTE — ED Triage Notes (Signed)
EMS brings pt in from home to lobby via w/c for fall at home, hx frequent falls; +COVID 2 days ago and currently being tx for UTI, foley in place

## 2021-02-19 NOTE — Care Management Important Message (Signed)
Important Message  Patient Details  Name: Max Ellis MRN: [id number] Date of Birth: 10-Aug-1933   Medicare Important Message Given:  Other (see comment)  Attempted to review Medicare IM with patient via room phone due to isolation status.  Unable to reach at this time.   Dannette Barbara 02/19/2021, 10:29 AM

## 2021-02-19 NOTE — ED Provider Notes (Signed)
[facility] Emergency Department Provider Note  ____________________________________________   Event Date/Time   First MD Initiated Contact with Patient 02/19/21 2155     (approximate)  I have reviewed the triage vital signs and the nursing notes.   HISTORY  Chief Complaint Fall   HPI Max Ellis is a 87 y.o. male with a past medical history of COPD, HTN, PVD, CKD and recent diagnosis of COVID yesterday who comes from home via EMS for assessment after a fall that patient sustained after sitting up quickly out of bed.  He reportedly struck his head.  No LOC.  He is not sure what he struck his head against.  He states that he has been feeling unsteady since getting home earlier today after recent hospitalization he was found to have COVID and high blood pressure and a small heart attack he says.  He denies any significant chest pain, shortness of breath, abd pain, vomiting, diarrhea or issues with his Foley that he states he has had since leaving the hospital.  States he has been taking amoxicillin for treatment of UTI dx when he was in the hospital as well. He denies any other acute concerns at this time although does endorse very poor appetite.  He states he did fall 2 weeks ago as well.         Past Medical History:  Diagnosis Date   COPD (chronic obstructive pulmonary disease) ([place])    Hypertension    Peripheral vascular disease ([place])    Renal disorder    Stage 4 chronic kidney disease ([place])     Patient Active Problem List   Diagnosis Date Noted   Fall 02/19/2021   COVID-19 virus infection 02/18/2021   NSTEMI (non-ST elevated myocardial infarction) ([place]) 02/17/2021   Hypertensive urgency 02/16/2021   Acute urinary retention 02/16/2021   Chronic kidney disease, stage 4 (severe) ([place]) 02/16/2021   Benign prostatic hyperplasia with incomplete bladder emptying 03/03/2020   Essential hypertension 01/26/2017   Tobacco use disorder 01/26/2017   Pain  in limb 01/26/2017    No past surgical history on file.  Prior to Admission medications   Medication Sig Start Date End Date Taking? Authorizing Provider  amoxicillin (AMOXIL) 500 MG capsule Take 1 capsule (500 mg total) by mouth every 12 (twelve) hours for 5 days. 02/19/21 02/24/21 Yes Patrecia Pour, MD  aspirin (ASPIRIN 81) 81 MG EC tablet Take 1 tablet (81 mg total) by mouth daily. 02/19/21  Yes Patrecia Pour, MD  atorvastatin (LIPITOR) 80 MG tablet Take 1 tablet (80 mg total) by mouth daily. 02/20/21  Yes Patrecia Pour, MD  calcitRIOL (ROCALTROL) 0.25 MCG capsule Take 0.25 mcg by mouth daily. 09/18/19  Yes [provider]  cholecalciferol (VITAMIN D3) 25 MCG (1000 UT) tablet Take 1,000 Units by mouth daily.   Yes [provider]  citalopram (CELEXA) 10 MG tablet Take 10 mg by mouth daily.   Yes [provider]  clopidogrel (PLAVIX) 75 MG tablet Take 1 tablet (75 mg total) by mouth daily. 02/20/21  Yes Patrecia Pour, MD  famotidine (PEPCID) 20 MG tablet Take by mouth. 02/26/20 02/25/21 Yes [provider]  finasteride (PROSCAR) 5 MG tablet Take 5 mg by mouth daily. 12/30/16  Yes [provider]  furosemide (LASIX) 40 MG tablet Take 40 mg by mouth daily. 12/29/20  Yes [provider]  latanoprost (XALATAN) 0.005 % ophthalmic solution Place 1 drop into both eyes at bedtime. 02/15/20  Yes [provider]  metoprolol tartrate (LOPRESSOR) 25 MG tablet Take 1 tablet (25 mg total) by mouth 2 (two) times daily. 02/19/21  Yes Patrecia Pour, MD  omeprazole (PRILOSEC) 20 MG capsule Take 20 mg by mouth daily.   Yes [provider]  tamsulosin (FLOMAX) 0.4 MG CAPS capsule Take 0.8 mg by mouth daily.   Yes [provider]  traZODone (DESYREL) 50 MG tablet Take 150 mg by mouth at bedtime.   Yes [provider]  trimethoprim (TRIMPEX) 100 MG tablet Take 0.5 tablets (50 mg total) by mouth daily. 01/09/21 04/09/21 Yes Vaillancourt,  Aldona Bar, PA-C    Allergies Patient has no known allergies.  No family history on file.  Social History Social History   Tobacco Use   Smoking status: Every Day    Types: Cigarettes   Smokeless tobacco: Former    Types: Chew  Substance Use Topics   Alcohol use: No   Drug use: No    Review of Systems  Review of Systems  Constitutional:  Negative for chills and fever.  HENT:  Negative for sore throat.   Eyes:  Negative for pain.  Respiratory:  Negative for cough and stridor.   Cardiovascular:  Negative for chest pain.  Gastrointestinal:  Negative for vomiting.  Musculoskeletal:  Positive for falls.  Skin:  Negative for rash.  Neurological:  Positive for headaches. Negative for seizures and loss of consciousness.  Psychiatric/Behavioral:  Negative for suicidal ideas.   All other systems reviewed and are negative.    ____________________________________________   PHYSICAL EXAM:  VITAL SIGNS: ED Triage Vitals  Enc Vitals Group     BP 02/19/21 2059 (!) 191/74     Pulse Rate 02/19/21 2059 81     Resp 02/19/21 2059 20     Temp 02/19/21 2059 99.5 F (37.5 C)     Temp Source 02/19/21 2059 Oral     SpO2 02/19/21 2059 99 %     Weight 02/19/21 2056 140 lb (63.5 kg)     Height 02/19/21 2056 '6\' 2"'$  (1.88 m)     Head Circumference --      Peak Flow --      Pain Score 02/19/21 2056 0     Pain Loc --      Pain Edu? --      Excl. in [place]? --    Vitals:   02/19/21 2059 02/19/21 2240  BP: (!) 191/74 (!) 199/63  Pulse: 81 81  Resp: 20 15  Temp: 99.5 F (37.5 C)   SpO2: 99% 99%   Physical Exam Vitals and nursing note reviewed.  Constitutional:      Appearance: He is well-developed.  HENT:     Head: Normocephalic and atraumatic.     Right Ear: External ear normal.     Left Ear: External ear normal.     Nose: Nose normal.  Eyes:     Conjunctiva/sclera: Conjunctivae normal.  Cardiovascular:     Rate and Rhythm: Normal rate and regular rhythm.     Heart sounds: No  murmur heard. Pulmonary:     Effort: Pulmonary effort is normal. No respiratory distress.     Breath sounds: Normal breath sounds.  Abdominal:     Palpations: Abdomen is soft.     Tenderness: There is no abdominal tenderness.  Musculoskeletal:     Cervical back: Neck supple.  Skin:    General: Skin is warm and dry.     Capillary Refill: Capillary refill  takes less than 2 seconds.  Neurological:     Mental Status: He is alert and oriented to person, place, and time.  Psychiatric:        Mood and Affect: Mood normal.    Patient has a very small less than 0.25 cm laceration over the left nasal bridge.  No significant deformity noted there is some erythema edema and tenderness over the left nasal bridge.  No significant septal hematoma.  Cranial nerves II through XII grossly intact.  Patient has symmetric strength in his upper and lower extremities and there is little bit of tremor in his upper extremities.  Sensation is intact to light touch lower extremities.  2+ radial pulse.  Cranial nerves II through XII grossly intact.  No tenderness to fomites of the C/T/L-spine.  On trial standing patient is very unsteady and unable to take more than 2 steps without near fall and requiring significant assistance.  Further ambulation deferred. ____________________________________________   LABS (all labs ordered are listed, but only abnormal results are displayed)  Labs Reviewed  CBC WITH DIFFERENTIAL/PLATELET  COMPREHENSIVE METABOLIC PANEL   ____________________________________________  EKG  Sinus rhythm with ventricular rate of 76, normal axis, unremarkable intervals without clearance of acute ischemia. ____________________________________________  RADIOLOGY  ED MD interpretation: CT head shows chronic ischemic changes without evidence of skull fracture or intracranial hemorrhage.  CT C-spine shows no evidence of acute spinal fracture dislocation.  Chronic right apical pleural and  parenchymal scarring.  CT face shows minimally displaced left-sided nasal bone fracture with some overlying swelling.  Chest x-ray is unremarkable for pneumonia, no thorax, effusion, focal consolidation or other clear acute intrathoracic process.  Official radiology report(s): CT Head Wo Contrast  Result Date: 02/19/2021 CLINICAL DATA:  [place], COVID-19 positive, hit head EXAM: CT HEAD WITHOUT CONTRAST TECHNIQUE: Contiguous axial images were obtained from the base of the skull through the vertex without intravenous contrast. COMPARISON:  None. FINDINGS: Brain: Confluent hypodensities throughout the periventricular and subcortical white matter are most consistent with chronic small vessel ischemic changes. 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 No signs of acute infarct or hemorrhage. There is diffuse cerebral atrophy with ex vacuo dilatation of the lateral ventricles. Midline structures are unremarkable. No acute extra-axial fluid collections. No mass effect. Vascular: Extensive atherosclerosis of the internal carotid arteries. No hyperdense vessel. Skull: Normal. Negative for fracture or focal lesion. Sinuses/Orbits: No acute finding. Other: None. IMPRESSION: 1. Chronic ischemic changes as above. No acute intracranial process. Electronically Signed   By: Randa Ngo M.D.   On: 02/19/2021 20:50   CT Cervical Spine Wo Contrast  Result Date: 02/19/2021 CLINICAL DATA:  [place], COVID-19 positive EXAM: CT CERVICAL SPINE WITHOUT CONTRAST TECHNIQUE: Multidetector CT imaging of the cervical spine was performed without intravenous contrast. Multiplanar CT image reconstructions were also generated. COMPARISON:  None. FINDINGS: Alignment: Left convex curvature of the cervical spine. Otherwise alignment is anatomic. Skull base and vertebrae: No acute fracture. No primary bone lesion or focal pathologic process. Soft tissues and spinal canal: No prevertebral fluid or swelling. No visible canal hematoma. Disc  levels: Multilevel cervical spondylosis and facet hypertrophy, with disc space narrowing most pronounced at C4-5, C5-6, and C6-7. Upper chest: Stable pleural and parenchymal density at the right apex consistent with chronic scarring, without significant change since chest CT 04/13/2019. No acute airspace disease. Central airway is patent. Other: Reconstructed images demonstrate no additional findings. IMPRESSION: 1. No acute cervical spine fracture. 2. Multilevel cervical spondylosis and facet hypertrophy as above. 3.  Chronic right apical pleural and parenchymal scarring. Electronically Signed   By: Randa Ngo M.D.   On: 02/19/2021 20:52   DG Chest Portable 1 View  Result Date: 02/19/2021 CLINICAL DATA:  Fall. Fall at home. History of frequent falls. COVID positive 2 days ago. EXAM: PORTABLE CHEST 1 VIEW COMPARISON:  Radiograph 4 days ago 02/15/2021, CT 3 days ago 02/16/2021. FINDINGS: Right apical pleuroparenchymal scarring. Subsegmental atelectasis at the left lung base. Stable heart size and mediastinal contours. Aortic atherosclerosis. There is no pulmonary edema, pleural effusion, or pneumothorax. No acute osseous abnormalities are seen. IMPRESSION: No acute abnormality. Electronically Signed   By: Keith Rake M.D.   On: 02/19/2021 22:30   CT MAXILLOFACIAL WO CONTRAST  Result Date: 02/19/2021 CLINICAL DATA:  [place], COVID-19 positive, hit head EXAM: CT MAXILLOFACIAL WITHOUT CONTRAST TECHNIQUE: Multidetector CT imaging of the maxillofacial structures was performed. Multiplanar CT image reconstructions were also generated. COMPARISON:  None. FINDINGS: Osseous: There is a minimally displaced left-sided nasal bone fracture, with overlying soft tissue swelling. No other acute facial bone fractures are identified. Orbits: Negative. No traumatic or inflammatory finding. Sinuses: Clear. Soft tissues: Prominent soft tissue swelling along the left aspect of the nasal bridge extending into the left  infraorbital region. Remaining soft tissues are unremarkable. Limited intracranial: No significant or unexpected finding. IMPRESSION: 1. Minimally displaced left-sided nasal bone fracture with overlying soft tissue swelling. Electronically Signed   By: Randa Ngo M.D.   On: 02/19/2021 20:54    ____________________________________________   PROCEDURES  Procedure(s) performed (including Critical Care):  Marland KitchenMarland KitchenLaceration Repair  Date/Time: 02/19/2021 10:53 PM Performed by: Lucrezia Starch, MD Authorized by: Lucrezia Starch, MD   Consent:    Consent obtained:  Verbal   Consent given by:  Patient   Risks discussed:  Pain   Alternatives discussed:  No treatment Universal protocol:    Procedure explained and questions answered to patient or proxy's satisfaction: yes     Patient identity confirmed:  Verbally with patient Laceration details:    Length (cm):  0.3 Exploration:    Imaging outcome: foreign body not noted     Wound exploration: wound explored through full range of motion     Contaminated: no   Treatment:    Area cleansed with:  Saline   Amount of cleaning:  Standard   Irrigation solution:  Sterile water   Irrigation method:  Syringe   Debridement:  Moderate   Undermining:  None   Scar revision: no   Skin repair:    Repair method:  Steri-Strips Approximation:    Approximation:  Loose   ____________________________________________   INITIAL IMPRESSION / ASSESSMENT AND PLAN / ED COURSE      Patient presents with above-stated history exam after being discharged from the hospital earlier today for assessment after a fall he states he experienced when getting out of bed.  States he is fairly unsteady on his feet and fell last week and thinks he just lost his balance today.  States he hit his head and did not hit anywhere else.  He did not have any LOC.  On arrival he is a very small lack not amenable to suture repair and some edema and tenderness over the left nasal  bridge and a skin tear over the dorsum of left hand.  Otherwise he has symmetric strength in his extremities without clear focal deficits although he has not oriented to month or year.  He denies any other acute associated sick symptoms.  CT head shows chronic ischemic changes without evidence of skull fracture or intracranial hemorrhage.  CT C-spine shows no evidence of acute spinal fracture dislocation.  Chronic right apical pleural and parenchymal scarring.  CT face shows minimally displaced left-sided nasal bone fracture with some overlying swelling.  X-ray is unremarkable.  Low suspicion for occult injury to the hand.  Skin tear noted as he has no significant tenderness or snuffbox tenderness or decree strength.  I did offer patient a repair of a slack on the left side of the nose although he states he prefers to have bandage.  Wound was irrigated and Steri-Strip applied.  Tetanus updated.  Somewhat difficult to exclude possible open fracture given this is where nasal fracture was and so we will give a dose of Ancef.  I did review patient's admission records and it seems he was evaluated by OT recommended SNF placement although family had initially preferred patient to go home.  I did reach his daughter states she now realizes that he can no longer be safe at home and thinks he needs to be admitted.  I will plan to admit to medicine service for further evaluation and management likely SNF placement.      ____________________________________________   FINAL CLINICAL IMPRESSION(S) / ED DIAGNOSES  Final diagnoses:  Fall, initial encounter  Open fracture of nasal bone, initial encounter  Unsteady gait  COVID  Hypertension, unspecified type    Medications  ceFAZolin (ANCEF) IVPB 1 g/50 mL premix (has no administration in time range)  Tdap (BOOSTRIX) injection 0.5 mL (0.5 mLs Intramuscular Given 02/19/21 2231)     ED Discharge Orders     None        Note:  This document was  prepared using Dragon voice recognition software and may include unintentional dictation errors.    Lucrezia Starch, MD 02/19/21 [id number]

## 2021-02-19 NOTE — ED Triage Notes (Signed)
Pt brought in via ems from home.  Pt sat up on side of bed today and fell striking a table.  No loc. Lac to left cheek area.  Bleeding controlled.  Lives alone.  Foley in place.  Pt alert.

## 2021-02-19 NOTE — Plan of Care (Signed)

## 2021-02-19 NOTE — Discharge Summary (Signed)
Physician Discharge Summary  Max Ellis [id number] DOB: [birth year] DOA: 02/15/2021  PCP: Maryland Pink, MD  Admit date: 02/15/2021 Discharge date: 02/19/2021  Admitted From: Home Disposition: Home   Recommendations for Outpatient Follow-up:  Follow up with PCP in 1-2 weeks Needs repeat BMP and CBC at follow up Voiding trial at follow up. Follow up with cardiology in 1-2 weeks for outpatient nuclear stress testing. Continue DAPT per cardiology.   Home Health: PT, OT, RN, aide (pt and family refused SNF recommendation) Equipment/Devices: None new (has WC, RW, 3n1).  Discharge Condition: Stable CODE STATUS: DNR Diet recommendation: Heart healthy  Brief/Interim Summary: Max Ellis is a 87 y.o. male with medical history significant for COPD, hypertension, peripheral vascular disease, stage IV chronic kidney disease, who presented to the emergency room with acute onset of substernal chest pain with associated mild dyspnea and feeling of bloating with no nausea or vomiting or abdominal pain.  Initial troponin was normal, increased to 1076 on 7/18 am.  Patient is seen by cardiology, placed on heparin drip for 48 hours with no further work up or intervention planned. Chest pain resolved. 7/19.  Patient had a visitor 3 days ago who was found to have positive for COVID.  Patient was tested again 7/19, found to be positive, and given monoclonal antibody 7/20 prior to discharge home.   Discharge Diagnoses:  Active Problems:   Essential hypertension   Hypertensive urgency   Acute urinary retention   Chronic kidney disease, stage 4 (severe) (HCC)   NSTEMI (non-ST elevated myocardial infarction) ([place])   COVID-19 virus infection  Hypertensive urgency, resolved at this time, patient is normotensive at this time - Continue antihypertensives as below.   NSTEMI: Completed heparin gtt with resolution of chest pain and downward trendf of troponin. Echocardiogram was unremarkable. - Continue  DAPT, statin, BB per cardiology - Follow up with Cardiology as outpatient for possible nuclear stress test.   Covid-19 infection: Due to concern for pt being presymptomatic and high risk for decompensation, monoclonal antibody given prior to discharge. No respiratory symptoms at this time. Paxlovid not a good choice with renal dysfunction. Remain on isolation for 10 days from positive test 7/19.  CKD stage IV: stable  GERD: Stable   PVD:  Follow up with vascular surgery  Acute urinary retention, BPH:  - Restart BPH medications, will DC with foley, will need voiding trial at follow up.    Anemia of CKD: Relatively stable with no bleeding noted.  - Monitor CBC at follow up  Thrombocytopenia: Possibly due to covid. Will trend at follow up. Stopping heparin at DC.  Discharge Instructions Discharge Instructions     Call MD for:  persistant nausea and vomiting   Complete by: As directed    Call MD for:  severe uncontrolled pain   Complete by: As directed    Call MD for:  temperature >100.4   Complete by: As directed    Diet - low sodium heart healthy   Complete by: As directed    Discharge instructions   Complete by: As directed    You were evaluated for elevated blood pressure and damage to the heart known as NSTEMI. You have no chest pain and your blood pressure has improved. Your kidney function is poor but stable and you have been cleared for discharge by cardiology. Rehabilitation in a skilled nursing facility has been recommended, though you've opted to return home so we will order home health therapy. Cardiology has recommended aspirin and  plavix daily as well as addition of metoprolol and atorvastatin to reduce risk of heart attack. These have been sent to your pharmacy.   You should also take amoxicillin twice daily for 5 more days after discharge to treat a Enterococcus UTI which may have caused urinary retention. After completing this antibiotic you may resume taking  trimethoprim. You will be discharged with the foley catheter in place and will need to drain this periodically and keep sterile technique/hygiene. Follow up with your primary doctor in the next week to trial removing the catheter to see if you can void spontaneously. Continue taking flomax and finasteride. You will also need repeat labs.   Remain in isolation for 10 days from 7/19 when you tested positive for covid. You will receive the monoclonal antibody prior to discharge to help fight covid and reduce risk of complications. If you experience vomiting, shortness of breath, chest pain, or other worrying symptoms, please seek medical attention right away.   Increase activity slowly   Complete by: As directed    MyChart COVID-19 home monitoring program   Complete by: Feb 19, 2021    Is the patient willing to use the [facility] [place] for home monitoring?: Yes      Allergies as of 02/19/2021   No Known Allergies      Medication List     STOP taking these medications    predniSONE 10 MG (21) Tbpk tablet Commonly known as: STERAPRED UNI-PAK 21 TAB       TAKE these medications    amoxicillin 500 MG capsule Commonly known as: AMOXIL Take 1 capsule (500 mg total) by mouth every 12 (twelve) hours for 5 days.   aspirin 81 MG EC tablet Commonly known as: Aspirin 81 Take 1 tablet (81 mg total) by mouth daily. What changed: medication strength   atorvastatin 80 MG tablet Commonly known as: LIPITOR Take 1 tablet (80 mg total) by mouth daily. Start taking on: February 20, 2021   calcitRIOL 0.25 MCG capsule Commonly known as: ROCALTROL Take 0.25 mcg by mouth daily.   cholecalciferol 25 MCG (1000 UNIT) tablet Commonly known as: VITAMIN D3 Take 1,000 Units by mouth daily.   citalopram 10 MG tablet Commonly known as: CELEXA Take 10 mg by mouth daily.   clopidogrel 75 MG tablet Commonly known as: PLAVIX Take 1 tablet (75 mg total) by mouth daily. Start taking on: February 20, 2021    famotidine 20 MG tablet Commonly known as: PEPCID Take by mouth.   finasteride 5 MG tablet Commonly known as: PROSCAR Take 5 mg by mouth daily.   furosemide 40 MG tablet Commonly known as: LASIX Take 40 mg by mouth daily.   latanoprost 0.005 % ophthalmic solution Commonly known as: XALATAN Place 1 drop into both eyes at bedtime.   metoprolol tartrate 25 MG tablet Commonly known as: LOPRESSOR Take 1 tablet (25 mg total) by mouth 2 (two) times daily.   omeprazole 20 MG capsule Commonly known as: PRILOSEC Take 20 mg by mouth daily.   tamsulosin 0.4 MG Caps capsule Commonly known as: FLOMAX Take 0.8 mg by mouth daily.   traZODone 50 MG tablet Commonly known as: DESYREL Take 150 mg by mouth at bedtime.   trimethoprim 100 MG tablet Commonly known as: TRIMPEX Take 0.5 tablets (50 mg total) by mouth daily.        Follow-up Information     Maryland Pink, MD. Schedule an appointment as soon as possible for a visit in 1  week(s).   Specialty: Family Medicine Why: with trial of voiding (removal of foley) in the clinic Contact information: [address] [facility] [place] [address] [phone]         Yolonda Kida, MD. Schedule an appointment as soon as possible for a visit in 1 week(s).   Specialties: Cardiology, Internal Medicine Why: for scheduling of stress test Contact information: [place] [address] [phone]         Murlean Iba, MD Follow up.   Specialty: Nephrology Contact information: [place] [address] [phone]                No Known Allergies  Consultations: Nephrology Cardiology  Procedures/Studies: DG Chest 2 View  Result Date: 02/15/2021 CLINICAL DATA:  Chest pain and shortness of breath for several days EXAM: CHEST - 2 VIEW COMPARISON:  01/11/2019, 04/15/2020 FINDINGS: Frontal and lateral views of the chest demonstrate a stable cardiac  silhouette. Stable spiculated density at the right apex compatible with scarring based on previous CT imaging. No acute airspace disease, effusion, or pneumothorax. No acute bony abnormalities. IMPRESSION: 1. No acute intrathoracic process. 2. Stable right apical scarring. Electronically Signed   By: Randa Ngo M.D.   On: 02/15/2021 18:50   ECHOCARDIOGRAM COMPLETE  Result Date: 02/17/2021    ECHOCARDIOGRAM REPORT   Patient Name:   TAYSHAUN PARZIALE Date of Exam: 02/17/2021 Medical Rec #:  [id number]     Height:       74.0 in Accession #:    [id number]    Weight:       138.0 lb Date of Birth:  [birth year]     BSA:          1.856 m Patient Age:    47 years      BP:           106/47 mmHg Patient Gender: M             HR:           75 bpm. Exam Location:  [facility] Procedure: 2D Echo, Cardiac Doppler and Color Doppler Indications:     Elevated troponin  History:         Patient has no prior history of Echocardiogram examinations.                  COPD; Risk Factors:Hypertension.  Sonographer:     Sherrie Sport RDCS (AE) Referring Phys:  [id number] [facility] D CALLWOOD Diagnosing Phys: Serafina Royals MD  Sonographer Comments: Technically challenging study due to limited acoustic windows, no apical window and suboptimal parasternal window. Image acquisition challenging due to patient body habitus and Image acquisition challenging due to COPD. IMPRESSIONS  1. Left ventricular ejection fraction, by estimation, is 60 to 65%. The left ventricle has normal function. The left ventricle has no regional wall motion abnormalities. Left ventricular diastolic parameters were normal.  2. Right ventricular systolic function is normal. The right ventricular size is normal.  3. The mitral valve is normal in structure. Mild mitral valve regurgitation.  4. The aortic valve is normal in structure. Aortic valve regurgitation is trivial. FINDINGS  Left Ventricle: Left ventricular ejection fraction, by estimation, is 60 to 65%. The left ventricle has  normal function. The left ventricle has no regional wall motion abnormalities. The left ventricular internal cavity size was normal in size. There is  no left ventricular hypertrophy. Left ventricular diastolic parameters were normal. Right  Ventricle: The right ventricular size is normal. No increase in right ventricular wall thickness. Right ventricular systolic function is normal. Left Atrium: Left atrial size was normal in size. Right Atrium: Right atrial size was normal in size. Pericardium: There is no evidence of pericardial effusion. Mitral Valve: The mitral valve is normal in structure. Mild mitral valve regurgitation. Tricuspid Valve: The tricuspid valve is normal in structure. Tricuspid valve regurgitation is mild. Aortic Valve: The aortic valve is normal in structure. Aortic valve regurgitation is trivial. Pulmonic Valve: The pulmonic valve was not well visualized. Pulmonic valve regurgitation is not visualized. Aorta: The aortic root and ascending aorta are structurally normal, with no evidence of dilitation. IAS/Shunts: No atrial level shunt detected by color flow Doppler.  LEFT VENTRICLE PLAX 2D LVIDd:         3.83 cm LVIDs:         2.59 cm LV PW:         1.35 cm LV IVS:        1.03 cm LVOT diam:     2.10 cm LVOT Area:     3.46 cm  LEFT ATRIUM         Index LA diam:    3.80 cm 2.05 cm/m                        PULMONIC VALVE AORTA                 PV Vmax:        1.12 m/s Ao Root diam: 3.50 cm PV Peak grad:   5.0 mmHg                       RVOT Peak grad: 9 mmHg  TRICUSPID VALVE TR Peak grad:   7.2 mmHg TR Vmax:        134.00 cm/s  SHUNTS Systemic Diam: 2.10 cm Serafina Royals MD Electronically signed by Serafina Royals MD Signature Date/Time: 02/17/2021/1:27:15 PM    Final    CT CHEST ABDOMEN PELVIS WO CONTRAST  Result Date: 02/16/2021 CLINICAL DATA:  Chest pain, dyspnea, unspecified abdominal pain EXAM: CT CHEST, ABDOMEN AND PELVIS WITHOUT CONTRAST TECHNIQUE: Multidetector CT imaging of the chest,  abdomen and pelvis was performed following the standard protocol without IV contrast. COMPARISON:  04/15/2020 FINDINGS: CT CHEST FINDINGS Cardiovascular: Extensive multi-vessel coronary artery calcification. Global cardiac size within normal limits. No pericardial effusion. Central pulmonary arteries are of normal caliber. Moderate atherosclerotic calcification within the thoracic aorta. No aortic aneurysm. Mediastinum/Nodes: No pathologic thoracic adenopathy. Visualized thyroid is unremarkable. The esophagus appears patulous, but is otherwise unremarkable. Lungs/Pleura: Asymmetric right apical scarring, likely post infectious or post inflammatory in nature, appears stable. 5-6 mm subpleural pulmonary nodule within the right upper lobe, axial image # 78/5, is stable. Additional scattered 2-3 mm pulmonary nodules throughout the lungs bilaterally are also stable. No new focal pulmonary nodules or infiltrates. No pneumothorax or pleural effusion. Central airways are widely patent. Musculoskeletal: Multiple mild anterior wedge deformities of the midthoracic spine with associated mild thoracic dextroscoliosis is stable since prior examination. No acute bone abnormality. No lytic or blastic bone lesion. CT ABDOMEN PELVIS FINDINGS Hepatobiliary: No focal liver abnormality is seen. No gallstones, gallbladder wall thickening, or biliary dilatation. Pancreas: Unremarkable Spleen: Unremarkable Adrenals/Urinary Tract: The adrenal glands are unremarkable. The kidneys are normal in position. The kidneys are mildly atrophic bilaterally, stable since prior examination. Vascular calcifications are noted within the renal  hila bilaterally. No hydronephrosis. No urinary calculi. No perinephric fluid collections are seen. The bladder is unremarkable. Stomach/Bowel: Stomach is within normal limits. Appendix appears normal. No evidence of bowel wall thickening, distention, or inflammatory changes. No free intraperitoneal gas or fluid.  Vascular/Lymphatic: Extensive aortoiliac atherosclerotic calcification. Particularly prominent atherosclerotic calcification noted within the lower extremity arterial inflow and visualized outflow. No aortic aneurysm. No pathologic adenopathy within the abdomen and pelvis. Reproductive: Prostate is unremarkable. Other: No abdominal wall hernia.  Rectum unremarkable. Musculoskeletal: Degenerative changes are seen within the lumbar spine. No acute bone abnormality. No lytic or blastic bone lesion identified. IMPRESSION: No acute intrathoracic or intra-abdominal pathology identified. No definite radiographic explanation for the patient's reported symptoms. Extensive multi-vessel coronary artery calcification. Stable asymmetric right apical pulmonary tumefactive scarring, likely the result of remote infection or inflammation. Stable scattered pulmonary nodules within the lungs bilaterally, with a dominant nodule noted within the right upper lobe measuring 5-6 mm. These are stable since remote prior examination of 01/11/2019 and are safely considered benign. Peripheral vascular disease. If there is clinical evidence of lower extremity arterial insufficiency, CT arteriography may be more helpful for further evaluation. Aortic Atherosclerosis (ICD10-I70.0). Electronically Signed   By: Fidela Salisbury MD   On: 02/16/2021 02:07      Subjective: Feels well, wants to go home. Max Ellis declines SNF. No chest pain or dyspnea.   Discharge Exam: Vitals:   02/19/21 0355 02/19/21 0754  BP: (!) 136/43 (!) 135/56  Pulse: 65 62  Resp:  18  Temp: (!) 100.5 F (38.1 C) 100.1 F (37.8 C)  SpO2: 97% 98%   General: Pt is alert, awake, not in acute distress Cardiovascular: RRR, S1/S2 +, no rubs, no gallops Respiratory: CTA bilaterally, no wheezing, no rhonchi Abdominal: Soft, NT, ND, bowel sounds + Extremities: No edema, no cyanosis  Labs: BNP (last 3 results) No results for input(s): BNP in the last 8760  hours. Basic Metabolic Panel: Recent Labs  Lab 02/15/21 1828 02/16/21 0603 02/18/21 0257 02/18/21 1220 02/19/21 0616  NA 140 139 138  --  137  K 5.4* 5.0 5.8* 5.0 3.8  CL 110 109 110  --  111  CO2 '25 24 22  '$ --  23  GLUCOSE 108* 89 77  --  99  BUN 42* 39* 38*  --  32*  CREATININE 3.70* 3.22* 3.14*  --  3.11*  CALCIUM 9.0 8.9 8.5*  --  8.0*  MG  --   --  2.1  --   --    Liver Function Tests: No results for input(s): AST, ALT, ALKPHOS, BILITOT, PROT, ALBUMIN in the last 168 hours. No results for input(s): LIPASE, AMYLASE in the last 168 hours. No results for input(s): AMMONIA in the last 168 hours. CBC: Recent Labs  Lab 02/15/21 1828 02/16/21 0603 02/18/21 0257 02/19/21 0616  WBC 7.1 7.8 5.8 4.6  NEUTROABS  --   --   --  3.6  HGB 10.0* 10.4* 9.5* 8.5*  HCT 31.5* 32.2* 28.9* 25.8*  MCV 102.3* 102.2* 101.0* 99.2  PLT 189 175 142* 121*   Cardiac Enzymes: No results for input(s): CKTOTAL, CKMB, CKMBINDEX, TROPONINI in the last 168 hours. BNP: Invalid input(s): POCBNP CBG: Recent Labs  Lab 02/19/21 0750  GLUCAP 104*   D-Dimer No results for input(s): DDIMER in the last 72 hours. Hgb A1c No results for input(s): HGBA1C in the last 72 hours. Lipid Profile No results for input(s): CHOL, HDL, LDLCALC, TRIG, CHOLHDL, LDLDIRECT in the last  72 hours. Thyroid function studies No results for input(s): TSH, T4TOTAL, T3FREE, THYROIDAB in the last 72 hours.  Invalid input(s): FREET3 Anemia work up No results for input(s): VITAMINB12, FOLATE, FERRITIN, TIBC, IRON, RETICCTPCT in the last 72 hours. Urinalysis    Component Value Date/Time   COLORURINE YELLOW (A) 02/16/2021 1246   APPEARANCEUR CLEAR (A) 02/16/2021 1246   APPEARANCEUR Clear 01/02/2021 1358   LABSPEC 1.013 02/16/2021 1246   PHURINE 6.0 02/16/2021 1246   GLUCOSEU NEGATIVE 02/16/2021 1246   HGBUR NEGATIVE 02/16/2021 1246   BILIRUBINUR NEGATIVE 02/16/2021 1246   BILIRUBINUR Negative 01/02/2021 [place] 02/16/2021 1246   PROTEINUR NEGATIVE 02/16/2021 1246   NITRITE NEGATIVE 02/16/2021 1246   LEUKOCYTESUR TRACE (A) 02/16/2021 1246    Microbiology Recent Results (from the past 240 hour(s))  Resp Panel by RT-PCR (Flu A&B, Covid) Nasopharyngeal Swab     Status: None   Collection Time: 02/16/21  2:56 AM   Specimen: Nasopharyngeal Swab; Nasopharyngeal(NP) swabs in vial transport medium  Result Value Ref Range Status   SARS Coronavirus 2 by RT PCR NEGATIVE NEGATIVE Final    Comment: (NOTE) SARS-CoV-2 target nucleic acids are NOT DETECTED.  The SARS-CoV-2 RNA is generally detectable in upper respiratory specimens during the acute phase of infection. The lowest concentration of SARS-CoV-2 viral copies this assay can detect is 138 copies/mL. A negative result does not preclude SARS-Cov-2 infection and should not be used as the sole basis for treatment or other patient management decisions. A negative result may occur with  improper specimen collection/handling, submission of specimen other than nasopharyngeal swab, presence of viral mutation(s) within the areas targeted by this assay, and inadequate number of viral copies(<138 copies/mL). A negative result must be combined with clinical observations, patient history, and epidemiological information. The expected result is Negative.  Fact Sheet for Patients:  EntrepreneurPulse.com.au  Fact Sheet for Healthcare Providers:  IncredibleEmployment.be  This test is no t yet approved or cleared by the [place] FDA and  has been authorized for detection and/or diagnosis of SARS-CoV-2 by FDA under an Emergency Use Authorization (EUA). This EUA will remain  in effect (meaning this test can be used) for the duration of the COVID-19 declaration under Section 564(b)(1) of the Act, 21 U.S.C.section 360bbb-3(b)(1), unless the authorization is terminated  or revoked sooner.       Influenza  A by PCR NEGATIVE NEGATIVE Final   Influenza B by PCR NEGATIVE NEGATIVE Final    Comment: (NOTE) The Xpert Xpress SARS-CoV-2/FLU/RSV plus assay is intended as an aid in the diagnosis of influenza from Nasopharyngeal swab specimens and should not be used as a sole basis for treatment. Nasal washings and aspirates are unacceptable for Xpert Xpress SARS-CoV-2/FLU/RSV testing.  Fact Sheet for Patients: EntrepreneurPulse.com.au  Fact Sheet for Healthcare Providers: IncredibleEmployment.be  This test is not yet approved or cleared by the [place] FDA and has been authorized for detection and/or diagnosis of SARS-CoV-2 by FDA under an Emergency Use Authorization (EUA). This EUA will remain in effect (meaning this test can be used) for the duration of the COVID-19 declaration under Section 564(b)(1) of the Act, 21 U.S.C. section 360bbb-3(b)(1), unless the authorization is terminated or revoked.  Performed at [facility], [address], [place], [place] [address]   Urine Culture     Status: Abnormal   Collection Time: 02/16/21 12:46 PM   Specimen: Urine, Clean Catch  Result Value Ref Range Status   Specimen Description   Final  URINE, CLEAN CATCH Performed at [facility], [address], [place], [place] [address]    Special Requests   Final    NONE Performed at [facility], [place], [place] [address]    Culture 30,000 COLONIES/mL ENTEROCOCCUS FAECALIS (A)  Final   Report Status 02/18/2021 FINAL  Final   Organism ID, Bacteria ENTEROCOCCUS FAECALIS (A)  Final      Susceptibility   Enterococcus faecalis - MIC*    AMPICILLIN <=2 SENSITIVE Sensitive     NITROFURANTOIN <=16 SENSITIVE Sensitive     VANCOMYCIN <=0.5 SENSITIVE Sensitive     * 30,000 COLONIES/mL ENTEROCOCCUS FAECALIS  Resp Panel by RT-PCR (Flu A&B, Covid) Nasopharyngeal Swab     Status: Abnormal   Collection Time: 02/18/21 10:20  AM   Specimen: Nasopharyngeal Swab; Nasopharyngeal(NP) swabs in vial transport medium  Result Value Ref Range Status   SARS Coronavirus 2 by RT PCR POSITIVE (A) NEGATIVE Final    Comment: RESULT CALLED TO, READ BACK BY AND VERIFIED WITH: Matilde Sprang, RN [id number]. 02/18/2021 GAA (NOTE) SARS-CoV-2 target nucleic acids are DETECTED.  The SARS-CoV-2 RNA is generally detectable in upper respiratory specimens during the acute phase of infection. Positive results are indicative of the presence of the identified virus, but do not rule out bacterial infection or co-infection with other pathogens not detected by the test. Clinical correlation with patient history and other diagnostic information is necessary to determine patient infection status. The expected result is Negative.  Fact Sheet for Patients: EntrepreneurPulse.com.au  Fact Sheet for Healthcare Providers: IncredibleEmployment.be  This test is not yet approved or cleared by the [place] FDA and  has been authorized for detection and/or diagnosis of SARS-CoV-2 by FDA under an Emergency Use Authorization (EUA).  This EUA will remain in effect (meaning this test can  be used) for the duration of  the COVID-19 declaration under Section 564(b)(1) of the Act, 21 U.S.C. section 360bbb-3(b)(1), unless the authorization is terminated or revoked sooner.     Influenza A by PCR NEGATIVE NEGATIVE Final   Influenza B by PCR NEGATIVE NEGATIVE Final    Comment: (NOTE) The Xpert Xpress SARS-CoV-2/FLU/RSV plus assay is intended as an aid in the diagnosis of influenza from Nasopharyngeal swab specimens and should not be used as a sole basis for treatment. Nasal washings and aspirates are unacceptable for Xpert Xpress SARS-CoV-2/FLU/RSV testing.  Fact Sheet for Patients: EntrepreneurPulse.com.au  Fact Sheet for Healthcare Providers: IncredibleEmployment.be  This test is  not yet approved or cleared by the [place] FDA and has been authorized for detection and/or diagnosis of SARS-CoV-2 by FDA under an Emergency Use Authorization (EUA). This EUA will remain in effect (meaning this test can be used) for the duration of the COVID-19 declaration under Section 564(b)(1) of the Act, 21 U.S.C. section 360bbb-3(b)(1), unless the authorization is terminated or revoked.  Performed at [facility], [address], [place],  [address]     Time coordinating discharge: Approximately 40 minutes  Patrecia Pour, MD  [facility] 02/19/2021, 10:21 AM

## 2021-02-19 NOTE — Progress Notes (Signed)
[facility] [place], [place] 02/19/21  Subjective:   Hospital day # 3   Renal: 07/19 0701 - 07/20 0700 In: 113.5 [I.V.:113.5] Out: 700 [Urine:700] Lab Results  Component Value Date   CREATININE 3.11 (H) 02/19/2021   CREATININE 3.14 (H) 02/18/2021   CREATININE 3.22 (H) 02/16/2021   Patient known to our practice from outpatient f/u Originally admitted for substernal chest pain Dx with NSTEMI; currently denies chest pain D/c'd heparin drip Cardiology deferred cardiac cath, medical management  Denies chest pain a this time  Objective:  Vital signs in last 24 hours:  Temp:  [98 F (36.7 C)-100.5 F (38.1 C)] 100.1 F (37.8 C) (07/20 0754) Pulse Rate:  [62-73] 62 (07/20 0754) Resp:  [18-20] 18 (07/20 0754) BP: (119-153)/(43-62) 135/56 (07/20 0754) SpO2:  [97 %-100 %] 98 % (07/20 0754)  Weight change:  Filed Weights   02/15/21 1827 02/16/21 0521  Weight: 68 kg 62.6 kg    Intake/Output:    Intake/Output Summary (Last 24 hours) at 02/19/2021 1226 Last data filed at 02/19/2021 0600 Gross per 24 hour  Intake 113.45 ml  Output 700 ml  Net -586.55 ml     Physical Exam: General:  No acute distress, laying in the bed  HEENT  anicteric, moist oral mucous membrane  Pulm/lungs  normal breathing effort, lungs are clear to auscultation  CVS/Heart  regular rhythm, no rub or gallop  Abdomen:   Soft, nontender  Extremities:  No peripheral edema  Neurologic:  Alert, oriented, able to follow commands  Skin:  No acute rashes      Basic Metabolic Panel:  Recent Labs  Lab 02/15/21 1828 02/16/21 0603 02/18/21 0257 02/18/21 1220 02/19/21 0616  NA 140 139 138  --  137  K 5.4* 5.0 5.8* 5.0 3.8  CL 110 109 110  --  111  CO2 '25 24 22  '$ --  23  GLUCOSE 108* 89 77  --  99  BUN 42* 39* 38*  --  32*  CREATININE 3.70* 3.22* 3.14*  --  3.11*  CALCIUM 9.0 8.9 8.5*  --  8.0*  MG  --   --  2.1  --   --       CBC: Recent Labs  Lab 02/15/21 1828  02/16/21 0603 02/18/21 0257 02/19/21 0616  WBC 7.1 7.8 5.8 4.6  NEUTROABS  --   --   --  3.6  HGB 10.0* 10.4* 9.5* 8.5*  HCT 31.5* 32.2* 28.9* 25.8*  MCV 102.3* 102.2* 101.0* 99.2  PLT 189 175 142* 121*      No results found for: HEPBSAG, HEPBSAB, HEPBIGM    Microbiology:  Recent Results (from the past 240 hour(s))  Resp Panel by RT-PCR (Flu A&B, Covid) Nasopharyngeal Swab     Status: None   Collection Time: 02/16/21  2:56 AM   Specimen: Nasopharyngeal Swab; Nasopharyngeal(NP) swabs in vial transport medium  Result Value Ref Range Status   SARS Coronavirus 2 by RT PCR NEGATIVE NEGATIVE Final    Comment: (NOTE) SARS-CoV-2 target nucleic acids are NOT DETECTED.  The SARS-CoV-2 RNA is generally detectable in upper respiratory specimens during the acute phase of infection. The lowest concentration of SARS-CoV-2 viral copies this assay can detect is 138 copies/mL. A negative result does not preclude SARS-Cov-2 infection and should not be used as the sole basis for treatment or other patient management decisions. A negative result may occur with  improper specimen collection/handling, submission of specimen other than nasopharyngeal swab, presence of  viral mutation(s) within the areas targeted by this assay, and inadequate number of viral copies(<138 copies/mL). A negative result must be combined with clinical observations, patient history, and epidemiological information. The expected result is Negative.  Fact Sheet for Patients:  EntrepreneurPulse.com.au  Fact Sheet for Healthcare Providers:  IncredibleEmployment.be  This test is no t yet approved or cleared by the [place] FDA and  has been authorized for detection and/or diagnosis of SARS-CoV-2 by FDA under an Emergency Use Authorization (EUA). This EUA will remain  in effect (meaning this test can be used) for the duration of the COVID-19 declaration under Section 564(b)(1) of  the Act, 21 U.S.C.section 360bbb-3(b)(1), unless the authorization is terminated  or revoked sooner.       Influenza A by PCR NEGATIVE NEGATIVE Final   Influenza B by PCR NEGATIVE NEGATIVE Final    Comment: (NOTE) The Xpert Xpress SARS-CoV-2/FLU/RSV plus assay is intended as an aid in the diagnosis of influenza from Nasopharyngeal swab specimens and should not be used as a sole basis for treatment. Nasal washings and aspirates are unacceptable for Xpert Xpress SARS-CoV-2/FLU/RSV testing.  Fact Sheet for Patients: EntrepreneurPulse.com.au  Fact Sheet for Healthcare Providers: IncredibleEmployment.be  This test is not yet approved or cleared by the [place] FDA and has been authorized for detection and/or diagnosis of SARS-CoV-2 by FDA under an Emergency Use Authorization (EUA). This EUA will remain in effect (meaning this test can be used) for the duration of the COVID-19 declaration under Section 564(b)(1) of the Act, 21 U.S.C. section 360bbb-3(b)(1), unless the authorization is terminated or revoked.  Performed at [facility], [address] [place], [place] [address]   Urine Culture     Status: Abnormal   Collection Time: 02/16/21 12:46 PM   Specimen: Urine, Clean Catch  Result Value Ref Range Status   Specimen Description   Final    URINE, CLEAN CATCH Performed at [facility], [address] [place], [place] [address]    Special Requests   Final    NONE Performed at [facility], [place], [place] [address]    Culture 30,000 COLONIES/mL ENTEROCOCCUS FAECALIS (A)  Final   Report Status 02/18/2021 FINAL  Final   Organism ID, Bacteria ENTEROCOCCUS FAECALIS (A)  Final      Susceptibility   Enterococcus faecalis - MIC*    AMPICILLIN <=2 SENSITIVE Sensitive     NITROFURANTOIN <=16 SENSITIVE Sensitive     VANCOMYCIN <=0.5 SENSITIVE Sensitive     * 30,000 COLONIES/mL ENTEROCOCCUS  FAECALIS  Resp Panel by RT-PCR (Flu A&B, Covid) Nasopharyngeal Swab     Status: Abnormal   Collection Time: 02/18/21 10:20 AM   Specimen: Nasopharyngeal Swab; Nasopharyngeal(NP) swabs in vial transport medium  Result Value Ref Range Status   SARS Coronavirus 2 by RT PCR POSITIVE (A) NEGATIVE Final    Comment: RESULT CALLED TO, READ BACK BY AND VERIFIED WITH: Matilde Sprang, RN [id number]. 02/18/2021 GAA (NOTE) SARS-CoV-2 target nucleic acids are DETECTED.  The SARS-CoV-2 RNA is generally detectable in upper respiratory specimens during the acute phase of infection. Positive results are indicative of the presence of the identified virus, but do not rule out bacterial infection or co-infection with other pathogens not detected by the test. Clinical correlation with patient history and other diagnostic information is necessary to determine patient infection status. The expected result is Negative.  Fact Sheet for Patients: EntrepreneurPulse.com.au  Fact Sheet for Healthcare Providers: IncredibleEmployment.be  This test is not  yet approved or cleared by the [place] and  has been authorized for detection and/or diagnosis of SARS-CoV-2 by FDA under an Emergency Use Authorization (EUA).  This EUA will remain in effect (meaning this test can  be used) for the duration of  the COVID-19 declaration under Section 564(b)(1) of the Act, 21 U.S.C. section 360bbb-3(b)(1), unless the authorization is terminated or revoked sooner.     Influenza A by PCR NEGATIVE NEGATIVE Final   Influenza B by PCR NEGATIVE NEGATIVE Final    Comment: (NOTE) The Xpert Xpress SARS-CoV-2/FLU/RSV plus assay is intended as an aid in the diagnosis of influenza from Nasopharyngeal swab specimens and should not be used as a sole basis for treatment. Nasal washings and aspirates are unacceptable for Xpert Xpress SARS-CoV-2/FLU/RSV testing.  Fact Sheet for  Patients: EntrepreneurPulse.com.au  Fact Sheet for Healthcare Providers: IncredibleEmployment.be  This test is not yet approved or cleared by the [place] FDA and has been authorized for detection and/or diagnosis of SARS-CoV-2 by FDA under an Emergency Use Authorization (EUA). This EUA will remain in effect (meaning this test can be used) for the duration of the COVID-19 declaration under Section 564(b)(1) of the Act, 21 U.S.C. section 360bbb-3(b)(1), unless the authorization is terminated or revoked.  Performed at [facility], [address] [place], [place] [address]     Coagulation Studies: No results for input(s): LABPROT, INR in the last 72 hours.  Urinalysis: Recent Labs    02/16/21 1246  COLORURINE YELLOW*  LABSPEC 1.013  PHURINE 6.0  GLUCOSEU NEGATIVE  HGBUR NEGATIVE  BILIRUBINUR NEGATIVE  KETONESUR NEGATIVE  PROTEINUR NEGATIVE  NITRITE NEGATIVE  LEUKOCYTESUR TRACE*       Imaging: ECHOCARDIOGRAM COMPLETE  Result Date: 02/17/2021    ECHOCARDIOGRAM REPORT   Patient Name:   Max Ellis Date of Exam: 02/17/2021 Medical Rec #:  [id number]     Height:       74.0 in Accession #:    [id number]    Weight:       138.0 lb Date of Birth:  16-Sep-1933     BSA:          1.856 m Patient Age:    49 years      BP:           106/47 mmHg Patient Gender: M             HR:           75 bpm. Exam Location:  [facility] Procedure: 2D Echo, Cardiac Doppler and Color Doppler Indications:     Elevated troponin  History:         Patient has no prior history of Echocardiogram examinations.                  COPD; Risk Factors:Hypertension.  Sonographer:     Sherrie Sport RDCS (AE) Referring Phys:  [id number] [facility] Diagnosing Phys: Serafina Royals MD  Sonographer Comments: Technically challenging study due to limited acoustic windows, no apical window and suboptimal parasternal window. Image acquisition challenging due to patient body habitus and  Image acquisition challenging due to COPD. IMPRESSIONS  1. Left ventricular ejection fraction, by estimation, is 60 to 65%. The left ventricle has normal function. The left ventricle has no regional wall motion abnormalities. Left ventricular diastolic parameters were normal.  2. Right ventricular systolic function is normal. The right ventricular size is normal.  3. The mitral valve is normal in structure. Mild mitral valve  regurgitation.  4. The aortic valve is normal in structure. Aortic valve regurgitation is trivial. FINDINGS  Left Ventricle: Left ventricular ejection fraction, by estimation, is 60 to 65%. The left ventricle has normal function. The left ventricle has no regional wall motion abnormalities. The left ventricular internal cavity size was normal in size. There is  no left ventricular hypertrophy. Left ventricular diastolic parameters were normal. Right Ventricle: The right ventricular size is normal. No increase in right ventricular wall thickness. Right ventricular systolic function is normal. Left Atrium: Left atrial size was normal in size. Right Atrium: Right atrial size was normal in size. Pericardium: There is no evidence of pericardial effusion. Mitral Valve: The mitral valve is normal in structure. Mild mitral valve regurgitation. Tricuspid Valve: The tricuspid valve is normal in structure. Tricuspid valve regurgitation is mild. Aortic Valve: The aortic valve is normal in structure. Aortic valve regurgitation is trivial. Pulmonic Valve: The pulmonic valve was not well visualized. Pulmonic valve regurgitation is not visualized. Aorta: The aortic root and ascending aorta are structurally normal, with no evidence of dilitation. IAS/Shunts: No atrial level shunt detected by color flow Doppler.  LEFT VENTRICLE PLAX 2D LVIDd:         3.83 cm LVIDs:         2.59 cm LV PW:         1.35 cm LV IVS:        1.03 cm LVOT diam:     2.10 cm LVOT Area:     3.46 cm  LEFT ATRIUM         Index LA diam:     3.80 cm 2.05 cm/m                        PULMONIC VALVE AORTA                 PV Vmax:        1.12 m/s Ao Root diam: 3.50 cm PV Peak grad:   5.0 mmHg                       RVOT Peak grad: 9 mmHg  TRICUSPID VALVE TR Peak grad:   7.2 mmHg TR Vmax:        134.00 cm/s  SHUNTS Systemic Diam: 2.10 cm Serafina Royals MD Electronically signed by Serafina Royals MD Signature Date/Time: 02/17/2021/1:27:15 PM    Final      Medications:    sodium chloride     famotidine (PEPCID) IV      amoxicillin  500 mg Oral Q12H   aspirin  81 mg Oral Daily   atorvastatin  80 mg Oral Daily   calcitRIOL  0.25 mcg Oral Daily   Chlorhexidine Gluconate Cloth  6 each Topical Daily   cholecalciferol  1,000 Units Oral Daily   citalopram  10 mg Oral Daily   clopidogrel  75 mg Oral Daily   finasteride  5 mg Oral Daily   latanoprost  1 drop Both Eyes QHS   melatonin  5 mg Oral QHS   metoprolol tartrate  25 mg Oral BID   nicotine  21 mg Transdermal Daily   pantoprazole  40 mg Oral Daily   patiromer  8.4 g Oral Daily   tamsulosin  0.8 mg Oral Daily   traZODone  150 mg Oral QHS   sodium chloride, acetaminophen **OR** acetaminophen, albuterol, diphenhydrAMINE, EPINEPHrine, famotidine (PEPCID) IV, hydrALAZINE, magnesium hydroxide, methylPREDNISolone (SOLU-MEDROL) injection, ondansetron **  OR** ondansetron (ZOFRAN) IV  Assessment/ Plan:  87 y.o. male with  HTN, Chronic low back pain, h/o alcoholism, Osteoarthritis, BPH, major depressive disorder, migtains  admitted on 02/15/2021 for Hypertensive urgency [I16.0] Hypertensive emergency [I16.1] Abdominal pain [R10.9] Chest pain [R07.9] Acute renal failure superimposed on stage 4 chronic kidney disease, unspecified acute renal failure type ([place]) [N17.9, N18.4]   #CKD st 4 Baseline Creatinine approx 3.2/GFR 19 from feb 2022 Current Cr of 3.11, back to baseline  # NSTEMI Denies further chest pain Given advanced age, Advanced CKD and comorbidities, he would be at high risk of  contrast ATN leading to dialysis Cardiac cath not performed based on comorbidities Medical management and follow with cardiology outpatient Heparin drip discontinued and started on low dose aspirin and Plavix.   # Hyperkalemia Current 3.8   LOS: 3 Colon Flattery 7/20/202212:26 PM  [place], [place]  Note: This note was prepared with Dragon dictation. Any transcription errors are unintentional

## 2021-02-19 NOTE — Consult Note (Signed)
ANTICOAGULATION CONSULT NOTE - Initial Consult  Pharmacy Consult for heparin infusion Indication: chest pain/ACS  No Known Allergies  Patient Measurements: Height: '6\' 2"'$  (188 cm) Weight: 62.6 kg (138 lb 0.1 oz) IBW/kg (Calculated) : 82.2 Heparin Dosing Weight: 62.6 kg  Vital Signs: Temp: 100.5 F (38.1 C) (07/20 0355) BP: 136/43 (07/20 0355) Pulse Rate: 65 (07/20 0355)  Labs: Recent Labs    02/17/21 0545 02/17/21 0842 02/17/21 1049 02/18/21 0257 02/18/21 0257 02/18/21 1220 02/18/21 2030 02/19/21 0616  HGB  --   --   --  9.5*  --   --   --  8.5*  HCT  --   --   --  28.9*  --   --   --  25.8*  PLT  --   --   --  142*  --   --   --  121*  HEPARINUNFRC  --   --   --  0.29*   < > 0.37 0.30 0.21*  CREATININE  --   --   --  3.14*  --   --   --  3.11*  TROPONINIHS 1,076* 1,061* 966*  --   --   --   --   --    < > = values in this interval not displayed.     Estimated Creatinine Clearance: 15.4 mL/min (A) (by C-G formula based on SCr of 3.11 mg/dL (H)).   Medical History: Past Medical History:  Diagnosis Date   COPD (chronic obstructive pulmonary disease) (HCC)    Hypertension    Peripheral vascular disease (HCC)    Renal disorder    Stage 4 chronic kidney disease (HCC)     Medications:  Last dose of Enoxaparin 30 mg (ppx) given 7/18 @ 0900 >> Hep gtt + ASA / Plavix, BB, Statin  Assessment: 87 y/o male complains of persistent shortness of breath dyspnea weakness. Troponin elevated 15> 1076. Pharmacy has been consulted for initiation and management of heparin infusion for ACS.  Date Time aPTT/HL Rate/Comment 7/19 0257 0.29  Subthera; 750 > 850 un/hr    7/19 1220 0.37  Thera x1; 850 un/hr 7/19 2030 0.30  Thera x2; 850 un/hr 7/20     0616    0.21                Subtherapeutic 850 un/hr  Baseline Labs: aPTT - unk BL Hgb - 10>9.5 Plts - 189>142 Troponin: 15 >> 1076 >> 1061 >> 966  Goal of Therapy:  Heparin level 0.3-0.7 units/ml Monitor platelets by  anticoagulation protocol: Yes   Plan:  7/20 @ 0616 :   HL = 0.21 Will order Heparin 900 units IV x 1 bolus and increase drip rate to 950 units/hr.  Will recheck HL 8 hrs after rate change.   Orene Desanctis, PharmD Clinical Pharmacist   02/19/2021,6:57 AM

## 2021-02-20 DIAGNOSIS — I214 Non-ST elevation (NSTEMI) myocardial infarction: Principal | ICD-10-CM

## 2021-02-20 DIAGNOSIS — W19XXXA Unspecified fall, initial encounter: Secondary | ICD-10-CM | POA: Diagnosis not present

## 2021-02-20 DIAGNOSIS — N184 Chronic kidney disease, stage 4 (severe): Secondary | ICD-10-CM | POA: Diagnosis not present

## 2021-02-20 DIAGNOSIS — N390 Urinary tract infection, site not specified: Secondary | ICD-10-CM | POA: Diagnosis not present

## 2021-02-20 DIAGNOSIS — U071 COVID-19: Secondary | ICD-10-CM | POA: Diagnosis not present

## 2021-02-20 DIAGNOSIS — F17200 Nicotine dependence, unspecified, uncomplicated: Secondary | ICD-10-CM

## 2021-02-20 LAB — CBC
HCT: 25.8 % — ABNORMAL LOW (ref 39.0–52.0)
Hemoglobin: 8.5 g/dL — ABNORMAL LOW (ref 13.0–17.0)
MCH: 33.1 pg (ref 26.0–34.0)
MCHC: 32.9 g/dL (ref 30.0–36.0)
MCV: 100.4 fL — ABNORMAL HIGH (ref 80.0–100.0)
Platelets: 106 10*3/uL — ABNORMAL LOW (ref 150–400)
RBC: 2.57 MIL/uL — ABNORMAL LOW (ref 4.22–5.81)
RDW: 13.9 % (ref 11.5–15.5)
WBC: 4.9 10*3/uL (ref 4.0–10.5)
nRBC: 0 % (ref 0.0–0.2)

## 2021-02-20 LAB — COMPREHENSIVE METABOLIC PANEL
ALT: 15 U/L (ref 0–44)
AST: 26 U/L (ref 15–41)
Albumin: 3 g/dL — ABNORMAL LOW (ref 3.5–5.0)
Alkaline Phosphatase: 47 U/L (ref 38–126)
Anion gap: 4 — ABNORMAL LOW (ref 5–15)
BUN: 34 mg/dL — ABNORMAL HIGH (ref 8–23)
CO2: 24 mmol/L (ref 22–32)
Calcium: 8.2 mg/dL — ABNORMAL LOW (ref 8.9–10.3)
Chloride: 110 mmol/L (ref 98–111)
Creatinine, Ser: 3.09 mg/dL — ABNORMAL HIGH (ref 0.61–1.24)
GFR, Estimated: 19 mL/min — ABNORMAL LOW (ref >60)
Glucose, Bld: 100 mg/dL — ABNORMAL HIGH (ref 70–99)
Potassium: 3.6 mmol/L (ref 3.5–5.1)
Sodium: 138 mmol/L (ref 135–145)
Total Bilirubin: 0.6 mg/dL (ref 0.3–1.2)
Total Protein: 5.6 g/dL — ABNORMAL LOW (ref 6.5–8.1)

## 2021-02-20 MED ORDER — LORAZEPAM 2 MG/ML IJ SOLN
0.5000 mg | Freq: Once | INTRAMUSCULAR | Status: AC
  Administered 2021-02-20: 0.5 mg via INTRAVENOUS
  Filled 2021-02-20: qty 1

## 2021-02-20 MED ORDER — CHLORHEXIDINE GLUCONATE CLOTH 2 % EX PADS: 6.0000 | MEDICATED_PAD | Freq: Every day | CUTANEOUS | Status: DC

## 2021-02-20 MED ORDER — LORAZEPAM 2 MG/ML IJ SOLN
0.5000 mg | Freq: Once | INTRAMUSCULAR | Status: AC | PRN
  Administered 2021-02-20: 0.5 mg via INTRAVENOUS
  Filled 2021-02-20: qty 1

## 2021-02-20 MED ORDER — FAMOTIDINE 20 MG PO TABS
20.0000 mg | ORAL_TABLET | Freq: Every day | ORAL | Status: DC
  Administered 2021-02-22 – 2021-03-03 (×10): 20 mg via ORAL
  Filled 2021-02-20 (×10): qty 1

## 2021-02-20 NOTE — TOC Initial Note (Signed)
Transition of Care [facility]) - Initial/Assessment Note    Patient Details  Name: Max Ellis MRN: [id number] Date of Birth: Apr 27, 1934  [facility]) CM/SW Contact:    Beverly Sessions, RN Phone Number: 02/20/2021, 2:44 PM  Clinical Narrative:                Patient admitted to hospital after a fall Was discharged from this facility on 7/20 Patient not A&O Spoke with daughter [name] over the phone  Baseline patient lives at home alone.  Wife passed away a month ago PCP [facility] - [name] transports to appointments  Denies issues obtaining medications.  [name] states "we have all the equipment he needs at home"  PT eval pending Previous admission on 7/19 PT was recommending SNF.  Patient and daughter declined SNF or home health at discharge  Anticipated that SNF will be recommended again.  Daughter states if so she is agreeable to bed search Tested covid positive on 7/19 Has not been vaccinated   Expected Discharge Plan: [place] Barriers to Discharge: Continued Medical Work up   Patient Goals and CMS Choice        Expected Discharge Plan and Services Expected Discharge Plan: [place]       Living arrangements for the past 2 months: Single Family Home                                      Prior Living Arrangements/Services Living arrangements for the past 2 months: Single Family Home Lives with:: Self                   Activities of Daily Living Home Assistive Devices/Equipment: None ADL Screening (condition at time of admission) Patient's cognitive ability adequate to safely complete daily activities?: Yes Is the patient deaf or have difficulty hearing?: Yes Does the patient have difficulty seeing, even when wearing glasses/contacts?: No Does the patient have difficulty concentrating, remembering, or making decisions?: No Patient able to express need for assistance with ADLs?: Yes Does the patient have  difficulty dressing or bathing?: Yes Independently performs ADLs?: Yes (appropriate for developmental age) Does the patient have difficulty walking or climbing stairs?: Yes Weakness of Legs: None Weakness of Arms/Hands: None  Permission Sought/Granted                  Emotional Assessment              Admission diagnosis:  Unsteady gait [R26.81] Fall [W19.XXXA] Fall, initial encounter [id number]] Open fracture of nasal bone, initial encounter [S02.2XXB] Hypertension, unspecified type [I10] COVID [U07.1] Patient Active Problem List   Diagnosis Date Noted   Fall 02/19/2021   Acute lower UTI 02/19/2021   COVID-19 virus infection 02/18/2021   NSTEMI (non-ST elevated myocardial infarction) ([place]) 02/17/2021   Hypertensive urgency 02/16/2021   Acute urinary retention 02/16/2021   Chronic kidney disease, stage 4 (severe) ([place]) 02/16/2021   Benign prostatic hyperplasia with incomplete bladder emptying 03/03/2020   Essential hypertension 01/26/2017   Tobacco use disorder 01/26/2017   Pain in limb 01/26/2017   PCP:  [place] Pink, MD Pharmacy:   [id number] [place]Phone: [phone]: [phone]    Social Determinants of Health (SDOH) Interventions    Readmission Risk Interventions Readmission Risk Prevention Plan 02/20/2021  Transportation Screening Complete  Palliative Care Screening Complete  Medication Review Press photographer) Complete  Some recent data might be hidden

## 2021-02-20 NOTE — Progress Notes (Signed)
PHARMACY NOTE:  RENAL DOSAGE ADJUSTMENT  Current  regimen includes a mismatch between dosage and estimated renal function.  As per policy approved by the Pharmacy & Therapeutics and Medical Executive Committees, the antimicrobial dosage will be adjusted accordingly.  Current famotidine dosage:  20 mg po BID (continued from home)  Renal Function:  Estimated Creatinine Clearance: 15.7 mL/min (A) (by C-G formula based on SCr of 3.09 mg/dL (H)). '[]'$      On intermittent HD, scheduled: '[]'$      On CRRT    famotidine dosage has been changed to:  20 mg po once daily   Thank you for allowing pharmacy to be a part of this patient's care.  Dallie Piles, [facility] 02/20/2021 8:32 AM

## 2021-02-20 NOTE — Progress Notes (Signed)
Sent Dr. Bonner Puna a secure chat and let MD know that patient arouses to voice but is not awake enough to take oral medications so he has not gotten his plavix, aspirin nor metoprolol today. Also asked MD about discontinuing foley since patient was discharged yesterday with foley for urinary retention and unable to give Flomax at this time. Dr. Bonner Puna acknowledged and said it's okay to wait to discontinue foley catheter until patient is more coherent.

## 2021-02-20 NOTE — Progress Notes (Signed)
PT Cancellation Note  Patient Details Name: Max Ellis MRN: [id number] DOB: January 05, 1934   Cancelled Treatment:    Reason Eval/Treat Not Completed: Patient's level of consciousness Chart reviewed, spoke with nurse who had just come out of his room.  She reports that he is very lethargic (had Ativan early this AM) and could hardly open his eyes or acknowledge staff in room.  Suggests that he would not be able to meaningfully participate at this time.  Will maintain on caseload and try back tomorrow.    Kreg Shropshire, DPT 02/20/2021, 10:44 AM

## 2021-02-20 NOTE — Progress Notes (Signed)
Anticoagulation monitoring(Lovenox):  87 yo male ordered Lovenox 40 mg Q24h    Filed Weights   02/19/21 2056  Weight: 63.5 kg (140 lb)   BMI  17.97   Lab Results  Component Value Date   CREATININE 3.16 (H) 02/19/2021   CREATININE 3.11 (H) 02/19/2021   CREATININE 3.14 (H) 02/18/2021   Estimated Creatinine Clearance: 15.4 mL/min (A) (by C-G formula based on SCr of 3.16 mg/dL (H)). Hemoglobin & Hematocrit     Component Value Date/Time   HGB 9.9 (L) 02/19/2021 2231   HGB 10.8 (L) 01/20/2013 0446   HCT 30.1 (L) 02/19/2021 2231   HCT 32.0 (L) 01/20/2013 0446     Per Protocol for Patient with estCrcl < 30 ml/min and BMI < 40, will transition to Lovenox 30 mg Q24h.

## 2021-02-20 NOTE — Progress Notes (Signed)
PROGRESS NOTE  Max Ellis  [id number] DOB: [birth year] DOA: 02/19/2021 PCP: [place] Pink, MD   Brief Narrative: Max Ellis is an 87yo male with a history of COPD, HTN, PVD, CKD stage IV, recent NSTEMI and covid-19 infection. He received medical treatment for NSTEMI with improvement in chest pain and troponin elevation, cleared for discharge by cardiology. Also received monoclonal antibody for covid-19 infection on 7/20. Despite multiple recommendations for discharge to SNF, the patient and family declined and the patient was discharged home with maximal home health support only to fall when getting out of bed at home the night of discharge prompting return to the hospital 7/20. He had a low grade fever without normal WBC. CT head and cervical spine were without acute changes.   Assessment & Plan: Principal Problem:   Fall Active Problems:   Essential hypertension   Tobacco use disorder   Chronic kidney disease, stage 4 (severe) (HCC)   NSTEMI (non-ST elevated myocardial infarction) ([place])   COVID-19 virus infection   Acute lower UTI  UTI:  - Continue amoxicillin per culture data (sensitive enterococcus).   BPH, urinary retention:  - Continue home medications and voiding trial once more alert.   Acute metabolic encephalopathy: Neuroimaging on readmission was negative - Avoid ativan as much as possible. Pt did not have significant agitation/delirium during past hospitalization, so suspect this may be related to covid infection. Also may have contribution by concussion.  Covid-19 infection: Negative CXR. Possible sequelae instead are weakness, encephalopathy. s/p MAB 7/20. - Check CRP, low threshold for remdesivir and if hypoxic, steroids.  - Remain on isolation x10 days.  Fall, generalized weakness:  - PT and OT to continue. Suspect his weakness is attributable to covid infection.  CAD, recent NSTEMI: No anginal complaints at this time.  - Continue DAPT, statin, beta  blocker - Still will need cardiology follow up.   Stage IV CKD: SCr at baseline. - Avoid nephrotoxins, continue home medications.   GERD:  - PPI  Anemia of CKD: At presumed baseline.   Thrombocytopenia: Suspected due to covid, slightly worse. With head trauma, downtrending platelets and baseline low hgb, will hold anticoagulation at this time.   Tobacco use:  - Nicotine patch  COPD:  - Continue BDs, no exacerbation.   DVT prophylaxis: SCDs Code Status: DNR Family Communication: None at bedside Disposition Plan:  Status is: Inpatient  Remains inpatient appropriate because:Altered mental status and Unsafe d/c plan  Dispo: The patient is from: Home              Anticipated d/c is to: SNF              Patient currently is not medically stable to d/c.   Difficult to place patient No  Consultants:  None  Procedures:  None  Antimicrobials: Amoxicillin   Subjective: Pt difficult to rouse after receiving ativan overnight. Has no complaints.   Objective: Vitals:   02/19/21 2315 02/19/21 2330 02/20/21 0008 02/20/21 1020  BP:  (!) 191/67 (!) 183/68 (!) 152/52  Pulse: 73 78 75 (!) 55  Resp: '14 17 18   '$ Temp:   99.2 F (37.3 C) 98.8 F (37.1 C)  TempSrc:   Oral Axillary  SpO2: 98% 98% 100% 96%  Weight:      Height:        Intake/Output Summary (Last 24 hours) at 02/20/2021 1324 Last data filed at 02/20/2021 1000 Gross per 24 hour  Intake 50 ml  Output 300 ml  Net -  250 ml   Filed Weights   02/19/21 2056  Weight: 63.5 kg    Gen: Frail, elderly male in no distress Pulm: Non-labored breathing room air. Clear to auscultation bilaterally.  CV: Regular rate and rhythm. No murmur, rub, or gallop. No JVD, no pitting pedal edema. GI: Abdomen soft, non-tender, non-distended, with normoactive bowel sounds. No organomegaly or masses felt. Ext: Warm, no deformities Skin: Hemostatic transverse laceration on nasal bridge.  Neuro: Drowsy, poorly responsive. Maintaining  airway.  Psych: Sedated.  Data Reviewed: I have personally reviewed following labs and imaging studies  CBC: Recent Labs  Lab 02/16/21 0603 02/18/21 0257 02/19/21 0616 02/19/21 2231 02/20/21 0331  WBC 7.8 5.8 4.6 6.0 4.9  NEUTROABS  --   --  3.6 4.9  --   HGB 10.4* 9.5* 8.5* 9.9* 8.5*  HCT 32.2* 28.9* 25.8* 30.1* 25.8*  MCV 102.2* 101.0* 99.2 99.0 100.4*  PLT 175 142* 121* 124* [id number]*   Basic Metabolic Panel: Recent Labs  Lab 02/16/21 0603 02/18/21 0257 02/18/21 1220 02/19/21 0616 02/19/21 2231 02/20/21 0331  NA 139 138  --  137 138 138  K 5.0 5.8* 5.0 3.8 4.0 3.6  CL 109 110  --  111 108 110  CO2 24 22  --  23 19* 24  GLUCOSE 89 77  --  99 105* 100*  BUN 39* 38*  --  32* 32* 34*  CREATININE 3.22* 3.14*  --  3.11* 3.16* 3.09*  CALCIUM 8.9 8.5*  --  8.0* 8.6* 8.2*  MG  --  2.1  --   --   --   --    GFR: Estimated Creatinine Clearance: 15.7 mL/min (A) (by C-G formula based on SCr of 3.09 mg/dL (H)). Liver Function Tests: Recent Labs  Lab 02/19/21 2231 02/20/21 0331  AST 34 26  ALT 13 15  ALKPHOS 57 47  BILITOT 1.0 0.6  PROT 6.5 5.6*  ALBUMIN 3.3* 3.0*   No results for input(s): LIPASE, AMYLASE in the last 168 hours. No results for input(s): AMMONIA in the last 168 hours. Coagulation Profile: No results for input(s): INR, PROTIME in the last 168 hours. Cardiac Enzymes: No results for input(s): CKTOTAL, CKMB, CKMBINDEX, TROPONINI in the last 168 hours. BNP (last 3 results) No results for input(s): PROBNP in the last 8760 hours. HbA1C: No results for input(s): HGBA1C in the last 72 hours. CBG: Recent Labs  Lab 02/19/21 0750  GLUCAP 104*   Lipid Profile: No results for input(s): CHOL, HDL, LDLCALC, TRIG, CHOLHDL, LDLDIRECT in the last 72 hours. Thyroid Function Tests: No results for input(s): TSH, T4TOTAL, FREET4, T3FREE, THYROIDAB in the last 72 hours. Anemia Panel: No results for input(s): VITAMINB12, FOLATE, FERRITIN, TIBC, IRON, RETICCTPCT in the  last 72 hours. Urine analysis:    Component Value Date/Time   COLORURINE YELLOW (A) 02/16/2021 1246   APPEARANCEUR CLEAR (A) 02/16/2021 1246   APPEARANCEUR Clear 01/02/2021 1358   LABSPEC 1.013 02/16/2021 1246   PHURINE 6.0 02/16/2021 1246   GLUCOSEU NEGATIVE 02/16/2021 1246   HGBUR NEGATIVE 02/16/2021 1246   BILIRUBINUR NEGATIVE 02/16/2021 1246   BILIRUBINUR Negative 01/02/2021 [place] 02/16/2021 1246   PROTEINUR NEGATIVE 02/16/2021 1246   NITRITE NEGATIVE 02/16/2021 1246   LEUKOCYTESUR TRACE (A) 02/16/2021 1246   Recent Results (from the past 240 hour(s))  Resp Panel by RT-PCR (Flu A&B, Covid) Nasopharyngeal Swab     Status: None   Collection Time: 02/16/21  2:56 AM   Specimen: Nasopharyngeal Swab;  Nasopharyngeal(NP) swabs in vial transport medium  Result Value Ref Range Status   SARS Coronavirus 2 by RT PCR NEGATIVE NEGATIVE Final    Comment: (NOTE) SARS-CoV-2 target nucleic acids are NOT DETECTED.  The SARS-CoV-2 RNA is generally detectable in upper respiratory specimens during the acute phase of infection. The lowest concentration of SARS-CoV-2 viral copies this assay can detect is 138 copies/mL. A negative result does not preclude SARS-Cov-2 infection and should not be used as the sole basis for treatment or other patient management decisions. A negative result may occur with  improper specimen collection/handling, submission of specimen other than nasopharyngeal swab, presence of viral mutation(s) within the areas targeted by this assay, and inadequate number of viral copies(<138 copies/mL). A negative result must be combined with clinical observations, patient history, and epidemiological information. The expected result is Negative.  Fact Sheet for Patients:  EntrepreneurPulse.com.au  Fact Sheet for Healthcare Providers:  IncredibleEmployment.be  This test is no t yet approved or cleared by the [place]  FDA and  has been authorized for detection and/or diagnosis of SARS-CoV-2 by FDA under an Emergency Use Authorization (EUA). This EUA will remain  in effect (meaning this test can be used) for the duration of the COVID-19 declaration under Section 564(b)(1) of the Act, 21 U.S.C.section 360bbb-3(b)(1), unless the authorization is terminated  or revoked sooner.       Influenza A by PCR NEGATIVE NEGATIVE Final   Influenza B by PCR NEGATIVE NEGATIVE Final    Comment: (NOTE) The Xpert Xpress SARS-CoV-2/FLU/RSV plus assay is intended as an aid in the diagnosis of influenza from Nasopharyngeal swab specimens and should not be used as a sole basis for treatment. Nasal washings and aspirates are unacceptable for Xpert Xpress SARS-CoV-2/FLU/RSV testing.  Fact Sheet for Patients: EntrepreneurPulse.com.au  Fact Sheet for Healthcare Providers: IncredibleEmployment.be  This test is not yet approved or cleared by the [place] FDA and has been authorized for detection and/or diagnosis of SARS-CoV-2 by FDA under an Emergency Use Authorization (EUA). This EUA will remain in effect (meaning this test can be used) for the duration of the COVID-19 declaration under Section 564(b)(1) of the Act, 21 U.S.C. section 360bbb-3(b)(1), unless the authorization is terminated or revoked.  Performed at [facility], [address], [place], [place] [address]   Urine Culture     Status: Abnormal   Collection Time: 02/16/21 12:46 PM   Specimen: Urine, Clean Catch  Result Value Ref Range Status   Specimen Description   Final    URINE, CLEAN CATCH Performed at [facility], [address] [place], [place] [address]    Special Requests   Final    NONE Performed at [facility], [place],  [address]    Culture 30,000 COLONIES/mL ENTEROCOCCUS FAECALIS (A)  Final   Report Status 02/18/2021 FINAL  Final   Organism  ID, Bacteria ENTEROCOCCUS FAECALIS (A)  Final      Susceptibility   Enterococcus faecalis - MIC*    AMPICILLIN <=2 SENSITIVE Sensitive     NITROFURANTOIN <=16 SENSITIVE Sensitive     VANCOMYCIN <=0.5 SENSITIVE Sensitive     * 30,000 COLONIES/mL ENTEROCOCCUS FAECALIS  Resp Panel by RT-PCR (Flu A&B, Covid) Nasopharyngeal Swab     Status: Abnormal   Collection Time: 02/18/21 10:20 AM   Specimen: Nasopharyngeal Swab; Nasopharyngeal(NP) swabs in vial transport medium  Result Value Ref Range Status   SARS Coronavirus 2 by RT PCR POSITIVE (A) NEGATIVE Final  Comment: RESULT CALLED TO, READ BACK BY AND VERIFIED WITH: Matilde Sprang, RN [id number]. 02/18/2021 GAA (NOTE) SARS-CoV-2 target nucleic acids are DETECTED.  The SARS-CoV-2 RNA is generally detectable in upper respiratory specimens during the acute phase of infection. Positive results are indicative of the presence of the identified virus, but do not rule out bacterial infection or co-infection with other pathogens not detected by the test. Clinical correlation with patient history and other diagnostic information is necessary to determine patient infection status. The expected result is Negative.  Fact Sheet for Patients: EntrepreneurPulse.com.au  Fact Sheet for Healthcare Providers: IncredibleEmployment.be  This test is not yet approved or cleared by the [place] FDA and  has been authorized for detection and/or diagnosis of SARS-CoV-2 by FDA under an Emergency Use Authorization (EUA).  This EUA will remain in effect (meaning this test can  be used) for the duration of  the COVID-19 declaration under Section 564(b)(1) of the Act, 21 U.S.C. section 360bbb-3(b)(1), unless the authorization is terminated or revoked sooner.     Influenza A by PCR NEGATIVE NEGATIVE Final   Influenza B by PCR NEGATIVE NEGATIVE Final    Comment: (NOTE) The Xpert Xpress SARS-CoV-2/FLU/RSV plus assay is intended as an  aid in the diagnosis of influenza from Nasopharyngeal swab specimens and should not be used as a sole basis for treatment. Nasal washings and aspirates are unacceptable for Xpert Xpress SARS-CoV-2/FLU/RSV testing.  Fact Sheet for Patients: EntrepreneurPulse.com.au  Fact Sheet for Healthcare Providers: IncredibleEmployment.be  This test is not yet approved or cleared by the [place] FDA and has been authorized for detection and/or diagnosis of SARS-CoV-2 by FDA under an Emergency Use Authorization (EUA). This EUA will remain in effect (meaning this test can be used) for the duration of the COVID-19 declaration under Section 564(b)(1) of the Act, 21 U.S.C. section 360bbb-3(b)(1), unless the authorization is terminated or revoked.  Performed at [facility], [address], [place],  [address]       Radiology Studies: CT Head Wo Contrast  Result Date: 02/19/2021 CLINICAL DATA:  [place], COVID-19 positive, hit head EXAM: CT HEAD WITHOUT CONTRAST TECHNIQUE: Contiguous axial images were obtained from the base of the skull through the vertex without intravenous contrast. COMPARISON:  None. FINDINGS: Brain: Confluent hypodensities throughout the periventricular and subcortical white matter are most consistent with chronic small vessel ischemic changes. 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 No signs of acute infarct or hemorrhage. There is diffuse cerebral atrophy with ex vacuo dilatation of the lateral ventricles. Midline structures are unremarkable. No acute extra-axial fluid collections. No mass effect. Vascular: Extensive atherosclerosis of the internal carotid arteries. No hyperdense vessel. Skull: Normal. Negative for fracture or focal lesion. Sinuses/Orbits: No acute finding. Other: None. IMPRESSION: 1. Chronic ischemic changes as above. No acute intracranial process. Electronically Signed   By: Randa Ngo M.D.   On:  02/19/2021 20:50   CT Cervical Spine Wo Contrast  Result Date: 02/19/2021 CLINICAL DATA:  [place], COVID-19 positive EXAM: CT CERVICAL SPINE WITHOUT CONTRAST TECHNIQUE: Multidetector CT imaging of the cervical spine was performed without intravenous contrast. Multiplanar CT image reconstructions were also generated. COMPARISON:  None. FINDINGS: Alignment: Left convex curvature of the cervical spine. Otherwise alignment is anatomic. Skull base and vertebrae: No acute fracture. No primary bone lesion or focal pathologic process. Soft tissues and spinal canal: No prevertebral fluid or swelling. No visible canal hematoma. Disc levels: Multilevel cervical spondylosis and facet hypertrophy, with disc space narrowing most pronounced at C4-5,  C5-6, and C6-7. Upper chest: Stable pleural and parenchymal density at the right apex consistent with chronic scarring, without significant change since chest CT 04/13/2019. No acute airspace disease. Central airway is patent. Other: Reconstructed images demonstrate no additional findings. IMPRESSION: 1. No acute cervical spine fracture. 2. Multilevel cervical spondylosis and facet hypertrophy as above. 3. Chronic right apical pleural and parenchymal scarring. Electronically Signed   By: Randa Ngo M.D.   On: 02/19/2021 20:52   DG Chest Portable 1 View  Result Date: 02/19/2021 CLINICAL DATA:  Fall. Fall at home. History of frequent falls. COVID positive 2 days ago. EXAM: PORTABLE CHEST 1 VIEW COMPARISON:  Radiograph 4 days ago 02/15/2021, CT 3 days ago 02/16/2021. FINDINGS: Right apical pleuroparenchymal scarring. Subsegmental atelectasis at the left lung base. Stable heart size and mediastinal contours. Aortic atherosclerosis. There is no pulmonary edema, pleural effusion, or pneumothorax. No acute osseous abnormalities are seen. IMPRESSION: No acute abnormality. Electronically Signed   By: Keith Rake M.D.   On: 02/19/2021 22:30   CT MAXILLOFACIAL WO  CONTRAST  Result Date: 02/19/2021 CLINICAL DATA:  [place], COVID-19 positive, hit head EXAM: CT MAXILLOFACIAL WITHOUT CONTRAST TECHNIQUE: Multidetector CT imaging of the maxillofacial structures was performed. Multiplanar CT image reconstructions were also generated. COMPARISON:  None. FINDINGS: Osseous: There is a minimally displaced left-sided nasal bone fracture, with overlying soft tissue swelling. No other acute facial bone fractures are identified. Orbits: Negative. No traumatic or inflammatory finding. Sinuses: Clear. Soft tissues: Prominent soft tissue swelling along the left aspect of the nasal bridge extending into the left infraorbital region. Remaining soft tissues are unremarkable. Limited intracranial: No significant or unexpected finding. IMPRESSION: 1. Minimally displaced left-sided nasal bone fracture with overlying soft tissue swelling. Electronically Signed   By: Randa Ngo M.D.   On: 02/19/2021 20:54    Scheduled Meds:  amoxicillin  500 mg Oral Q12H   aspirin EC  81 mg Oral Daily   atorvastatin  80 mg Oral Daily   calcitRIOL  0.25 mcg Oral Daily   cholecalciferol  1,000 Units Oral Daily   citalopram  10 mg Oral Daily   clopidogrel  75 mg Oral Daily   enoxaparin (LOVENOX) injection  30 mg Subcutaneous Q24H   [START ON 02/21/2021] famotidine  20 mg Oral Daily   finasteride  5 mg Oral Daily   furosemide  40 mg Oral Daily   latanoprost  1 drop Both Eyes QHS   metoprolol tartrate  25 mg Oral BID   pantoprazole  40 mg Oral Daily   tamsulosin  0.8 mg Oral Daily   traZODone  150 mg Oral QHS   trimethoprim  50 mg Oral Daily   Continuous Infusions:   LOS: 1 day   Time spent: 35 minutes.  Patrecia Pour, MD [facility] [email or web address] 02/20/2021, 1:24 PM

## 2021-02-20 NOTE — Progress Notes (Signed)
Patient is confused and agitated.  Dr. Tamala Julian notified and ativan 0.'5mg'$  ordered. Patient refuses all medication.  Given ativan.  Will continue to monitor.  Christene Slates

## 2021-02-20 NOTE — Progress Notes (Addendum)
Patient was still agitated after the 0.'5mg'$  of ativan was given, so another dose was given about an hour later.  Patient is now resting in bed.  Will continue to monitor.  Christene Slates

## 2021-02-20 NOTE — Progress Notes (Signed)
Report given to [place], Therapist, sports. When doing bedside report and repositioning patient he told both nurses including myself and Gwen to leave him alone.

## 2021-02-21 DIAGNOSIS — U071 COVID-19: Secondary | ICD-10-CM | POA: Diagnosis not present

## 2021-02-21 DIAGNOSIS — W19XXXA Unspecified fall, initial encounter: Secondary | ICD-10-CM | POA: Diagnosis not present

## 2021-02-21 DIAGNOSIS — N184 Chronic kidney disease, stage 4 (severe): Secondary | ICD-10-CM | POA: Diagnosis not present

## 2021-02-21 DIAGNOSIS — N390 Urinary tract infection, site not specified: Secondary | ICD-10-CM | POA: Diagnosis not present

## 2021-02-21 LAB — CBC WITH DIFFERENTIAL/PLATELET
Abs Immature Granulocytes: 0.01 10*3/uL (ref 0.00–0.07)
Basophils Absolute: 0 10*3/uL (ref 0.0–0.1)
Basophils Relative: 0 %
Eosinophils Absolute: 0 10*3/uL (ref 0.0–0.5)
Eosinophils Relative: 0 %
HCT: 29.5 % — ABNORMAL LOW (ref 39.0–52.0)
Hemoglobin: 9.8 g/dL — ABNORMAL LOW (ref 13.0–17.0)
Immature Granulocytes: 0 %
Lymphocytes Relative: 19 %
Lymphs Abs: 0.9 10*3/uL (ref 0.7–4.0)
MCH: 32.9 pg (ref 26.0–34.0)
MCHC: 33.2 g/dL (ref 30.0–36.0)
MCV: 99 fL (ref 80.0–100.0)
Monocytes Absolute: 0.6 10*3/uL (ref 0.1–1.0)
Monocytes Relative: 12 %
Neutro Abs: 3.4 10*3/uL (ref 1.7–7.7)
Neutrophils Relative %: 69 %
Platelets: 105 10*3/uL — ABNORMAL LOW (ref 150–400)
RBC: 2.98 MIL/uL — ABNORMAL LOW (ref 4.22–5.81)
RDW: 14 % (ref 11.5–15.5)
WBC: 4.9 10*3/uL (ref 4.0–10.5)
nRBC: 0 % (ref 0.0–0.2)

## 2021-02-21 LAB — COMPREHENSIVE METABOLIC PANEL
ALT: 13 U/L (ref 0–44)
AST: 28 U/L (ref 15–41)
Albumin: 2.8 g/dL — ABNORMAL LOW (ref 3.5–5.0)
Alkaline Phosphatase: 48 U/L (ref 38–126)
Anion gap: 7 (ref 5–15)
BUN: 37 mg/dL — ABNORMAL HIGH (ref 8–23)
CO2: 22 mmol/L (ref 22–32)
Calcium: 8.4 mg/dL — ABNORMAL LOW (ref 8.9–10.3)
Chloride: 112 mmol/L — ABNORMAL HIGH (ref 98–111)
Creatinine, Ser: 3.05 mg/dL — ABNORMAL HIGH (ref 0.61–1.24)
GFR, Estimated: 19 mL/min — ABNORMAL LOW (ref >60)
Glucose, Bld: 76 mg/dL (ref 70–99)
Potassium: 4.1 mmol/L (ref 3.5–5.1)
Sodium: 141 mmol/L (ref 135–145)
Total Bilirubin: 0.8 mg/dL (ref 0.3–1.2)
Total Protein: 5.6 g/dL — ABNORMAL LOW (ref 6.5–8.1)

## 2021-02-21 LAB — C-REACTIVE PROTEIN: CRP: 5.9 mg/dL — ABNORMAL HIGH (ref <1.0)

## 2021-02-21 NOTE — Progress Notes (Signed)
PROGRESS NOTE  Max Ellis  [id number] DOB: 05/17/1934 DOA: 02/19/2021 PCP: [place] Pink, MD   Brief Narrative: Max Ellis is an 87yo male with a history of COPD, HTN, PVD, CKD stage IV, recent NSTEMI and covid-19 infection. He received medical treatment for NSTEMI with improvement in chest pain and troponin elevation, cleared for discharge by cardiology. Also received monoclonal antibody for covid-19 infection on 7/20. Despite multiple recommendations for discharge to SNF, the patient and family declined and the patient was discharged home with maximal home health support only to fall when getting out of bed at home the night of discharge prompting return to the hospital 7/20. He had a low grade fever without normal WBC. CT head and cervical spine were without acute changes.   Assessment & Plan: Principal Problem:   Fall Active Problems:   Essential hypertension   Tobacco use disorder   Chronic kidney disease, stage 4 (severe) (HCC)   NSTEMI (non-ST elevated myocardial infarction) ([place])   COVID-19 virus infection   Acute lower UTI  UTI:  - Continue amoxicillin x7 days (put stop date) per culture data (sensitive enterococcus).   BPH, urinary retention:  - Continue home medications. Voiding trial today.   Acute metabolic encephalopathy: Neuroimaging on readmission was negative - Avoid ativan as much as possible. Pt did not have significant agitation/delirium during past hospitalization, so suspect this may be related to covid infection. Also may have contribution by concussion. Mentation improved on 7/22. Again, would avoid ativan in favor of sitter/redirection as much as possible.   Covid-19 infection: Negative CXR. Possible sequelae instead are weakness, encephalopathy.  - s/p MAB 7/20. - CRP is elevated modestly, though no respiratory symptoms to date.  - Remain on isolation x10 days.  Fall, generalized weakness:  - PT and OT to continue. Suspect his weakness is  attributable to covid infection and is persistent on chronic deconditioning/advanced age. Strongly suspect SNF discharge would be safest option for this patient. TOC on board.  CAD, recent NSTEMI: No anginal complaints at this time.  - Continue DAPT, statin, beta blocker - Still will need cardiology follow up.   Stage IV CKD: SCr remains at baseline. - Avoid nephrotoxins, continue home medications. Avoiding IVF, pushing po.  GERD:  - PPI  Anemia of CKD: At presumed baseline. No ongoing bleeding despite diffuse bruising (which is not progressive since admission)  Thrombocytopenia: Suspected due to covid, stabilized. With head trauma, downtrending platelets and baseline low hgb, will hold anticoagulation at this time.   Tobacco use:  - Nicotine patch  COPD:  - Continue BDs, no exacerbation.   DVT prophylaxis: SCDs Code Status: DNR Family Communication: None at bedside Disposition Plan:  Status is: Inpatient  Remains inpatient appropriate because:Altered mental status and Unsafe d/c plan  Dispo: The patient is from: Home              Anticipated d/c is to: SNF              Patient currently is not medically stable to d/c.   Difficult to place patient No  Consultants:  None  Procedures:  None  Antimicrobials: Amoxicillin   Subjective: Grouchy and tired, feels weak diffusely without focal weakness or numbness. No dyspnea, cough, chest pain or palpitations. Has not been eating much but no nausea or vomiting.   Objective: Vitals:   02/20/21 1020 02/20/21 1727 02/20/21 2008 02/21/21 0918  BP: (!) 152/52 (!) 128/49 (!) 169/60 (!) 160/53  Pulse: (!) 55 (!) 57  68 (!) 59  Resp:  16  16  Temp: 98.8 F (37.1 C)  97.6 F (36.4 C) 97.6 F (36.4 C)  TempSrc: Axillary   Axillary  SpO2: 96% 97% 100% 97%  Weight:      Height:        Intake/Output Summary (Last 24 hours) at 02/21/2021 1554 Last data filed at 02/21/2021 [id number] Gross per 24 hour  Intake 0 ml  Output 325 ml   Net -325 ml   Filed Weights   02/19/21 2056  Weight: 63.5 kg   Gen: Frail elderly male in no distress Pulm: Nonlabored breathing room air. Clear bilaterally. CV: Regular rate and rhythm, borderline bradycardia. No new murmur, rub, or gallop. No JVD, no pitting dependent edema. GI: Abdomen soft, non-tender, non-distended, with normoactive bowel sounds.  Ext: Warm, no deformities Skin: Significant diffuse ecchymoses are stable. No new rashes, lesions or ulcers on visualized skin. Neuro: Alert and incompletely interactive with neuro exam. Diffusely weak limiting ability to sit/stand. No definite focal neurological deficits. Psych: Judgement and insight appear fair. Appears withdrawn. Calm.  Data Reviewed: I have personally reviewed following labs and imaging studies  CBC: Recent Labs  Lab 02/18/21 0257 02/19/21 0616 02/19/21 2231 02/20/21 0331 02/21/21 0519  WBC 5.8 4.6 6.0 4.9 4.9  NEUTROABS  --  3.6 4.9  --  3.4  HGB 9.5* 8.5* 9.9* 8.5* 9.8*  HCT 28.9* 25.8* 30.1* 25.8* 29.5*  MCV 101.0* 99.2 99.0 100.4* 99.0  PLT 142* 121* 124* 106* [id number]*   Basic Metabolic Panel: Recent Labs  Lab 02/18/21 0257 02/18/21 1220 02/19/21 0616 02/19/21 2231 02/20/21 0331 02/21/21 0519  NA 138  --  137 138 138 141  K 5.8* 5.0 3.8 4.0 3.6 4.1  CL 110  --  111 108 110 112*  CO2 22  --  23 19* 24 22  GLUCOSE 77  --  99 105* 100* 76  BUN 38*  --  32* 32* 34* 37*  CREATININE 3.14*  --  3.11* 3.16* 3.09* 3.05*  CALCIUM 8.5*  --  8.0* 8.6* 8.2* 8.4*  MG 2.1  --   --   --   --   --    GFR: Estimated Creatinine Clearance: 15.9 mL/min (A) (by C-G formula based on SCr of 3.05 mg/dL (H)). Liver Function Tests: Recent Labs  Lab 02/19/21 2231 02/20/21 0331 02/21/21 0519  AST 34 26 28  ALT '13 15 13  '$ ALKPHOS 57 47 48  BILITOT 1.0 0.6 0.8  PROT 6.5 5.6* 5.6*  ALBUMIN 3.3* 3.0* 2.8*   No results for input(s): LIPASE, AMYLASE in the last 168 hours. No results for input(s): AMMONIA in the  last 168 hours. Coagulation Profile: No results for input(s): INR, PROTIME in the last 168 hours. Cardiac Enzymes: No results for input(s): CKTOTAL, CKMB, CKMBINDEX, TROPONINI in the last 168 hours. BNP (last 3 results) No results for input(s): PROBNP in the last 8760 hours. HbA1C: No results for input(s): HGBA1C in the last 72 hours. CBG: Recent Labs  Lab 02/19/21 0750  GLUCAP 104*   Lipid Profile: No results for input(s): CHOL, HDL, LDLCALC, TRIG, CHOLHDL, LDLDIRECT in the last 72 hours. Thyroid Function Tests: No results for input(s): TSH, T4TOTAL, FREET4, T3FREE, THYROIDAB in the last 72 hours. Anemia Panel: No results for input(s): VITAMINB12, FOLATE, FERRITIN, TIBC, IRON, RETICCTPCT in the last 72 hours. Urine analysis:    Component Value Date/Time   COLORURINE YELLOW (A) 02/16/2021 1246   APPEARANCEUR CLEAR (A) 02/16/2021  [place] 01/02/2021 1358   LABSPEC 1.013 02/16/2021 1246   PHURINE 6.0 02/16/2021 1246   GLUCOSEU NEGATIVE 02/16/2021 1246   HGBUR NEGATIVE 02/16/2021 [place] 02/16/2021 1246   BILIRUBINUR Negative 01/02/2021 [place] 02/16/2021 1246   PROTEINUR NEGATIVE 02/16/2021 1246   NITRITE NEGATIVE 02/16/2021 1246   LEUKOCYTESUR TRACE (A) 02/16/2021 1246   Recent Results (from the past 240 hour(s))  Resp Panel by RT-PCR (Flu A&B, Covid) Nasopharyngeal Swab     Status: None   Collection Time: 02/16/21  2:56 AM   Specimen: Nasopharyngeal Swab; Nasopharyngeal(NP) swabs in vial transport medium  Result Value Ref Range Status   SARS Coronavirus 2 by RT PCR NEGATIVE NEGATIVE Final    Comment: (NOTE) SARS-CoV-2 target nucleic acids are NOT DETECTED.  The SARS-CoV-2 RNA is generally detectable in upper respiratory specimens during the acute phase of infection. The lowest concentration of SARS-CoV-2 viral copies this assay can detect is 138 copies/mL. A negative result does not preclude SARS-Cov-2 infection  and should not be used as the sole basis for treatment or other patient management decisions. A negative result may occur with  improper specimen collection/handling, submission of specimen other than nasopharyngeal swab, presence of viral mutation(s) within the areas targeted by this assay, and inadequate number of viral copies(<138 copies/mL). A negative result must be combined with clinical observations, patient history, and epidemiological information. The expected result is Negative.  Fact Sheet for Patients:  EntrepreneurPulse.com.au  Fact Sheet for Healthcare Providers:  IncredibleEmployment.be  This test is no t yet approved or cleared by the [place] FDA and  has been authorized for detection and/or diagnosis of SARS-CoV-2 by FDA under an Emergency Use Authorization (EUA). This EUA will remain  in effect (meaning this test can be used) for the duration of the COVID-19 declaration under Section 564(b)(1) of the Act, 21 U.S.C.section 360bbb-3(b)(1), unless the authorization is terminated  or revoked sooner.       Influenza A by PCR NEGATIVE NEGATIVE Final   Influenza B by PCR NEGATIVE NEGATIVE Final    Comment: (NOTE) The Xpert Xpress SARS-CoV-2/FLU/RSV plus assay is intended as an aid in the diagnosis of influenza from Nasopharyngeal swab specimens and should not be used as a sole basis for treatment. Nasal washings and aspirates are unacceptable for Xpert Xpress SARS-CoV-2/FLU/RSV testing.  Fact Sheet for Patients: EntrepreneurPulse.com.au  Fact Sheet for Healthcare Providers: IncredibleEmployment.be  This test is not yet approved or cleared by the [place] FDA and has been authorized for detection and/or diagnosis of SARS-CoV-2 by FDA under an Emergency Use Authorization (EUA). This EUA will remain in effect (meaning this test can be used) for the duration of the COVID-19 declaration  under Section 564(b)(1) of the Act, 21 U.S.C. section 360bbb-3(b)(1), unless the authorization is terminated or revoked.  Performed at [facility], [address], [place], [place] [address]   Urine Culture     Status: Abnormal   Collection Time: 02/16/21 12:46 PM   Specimen: Urine, Clean Catch  Result Value Ref Range Status   Specimen Description   Final    URINE, CLEAN CATCH Performed at [facility], [address], [place], [place] [address]    Special Requests   Final    NONE Performed at [facility], [place], [place] [address]    Culture 30,000 COLONIES/mL ENTEROCOCCUS FAECALIS (A)  Final   Report Status 02/18/2021 FINAL  Final  Organism ID, Bacteria ENTEROCOCCUS FAECALIS (A)  Final      Susceptibility   Enterococcus faecalis - MIC*    AMPICILLIN <=2 SENSITIVE Sensitive     NITROFURANTOIN <=16 SENSITIVE Sensitive     VANCOMYCIN <=0.5 SENSITIVE Sensitive     * 30,000 COLONIES/mL ENTEROCOCCUS FAECALIS  Resp Panel by RT-PCR (Flu A&B, Covid) Nasopharyngeal Swab     Status: Abnormal   Collection Time: 02/18/21 10:20 AM   Specimen: Nasopharyngeal Swab; Nasopharyngeal(NP) swabs in vial transport medium  Result Value Ref Range Status   SARS Coronavirus 2 by RT PCR POSITIVE (A) NEGATIVE Final    Comment: RESULT CALLED TO, READ BACK BY AND VERIFIED WITH: Matilde Sprang, RN [id number]. 02/18/2021 GAA (NOTE) SARS-CoV-2 target nucleic acids are DETECTED.  The SARS-CoV-2 RNA is generally detectable in upper respiratory specimens during the acute phase of infection. Positive results are indicative of the presence of the identified virus, but do not rule out bacterial infection or co-infection with other pathogens not detected by the test. Clinical correlation with patient history and other diagnostic information is necessary to determine patient infection status. The expected result is Negative.  Fact Sheet for  Patients: EntrepreneurPulse.com.au  Fact Sheet for Healthcare Providers: IncredibleEmployment.be  This test is not yet approved or cleared by the [place] FDA and  has been authorized for detection and/or diagnosis of SARS-CoV-2 by FDA under an Emergency Use Authorization (EUA).  This EUA will remain in effect (meaning this test can  be used) for the duration of  the COVID-19 declaration under Section 564(b)(1) of the Act, 21 U.S.C. section 360bbb-3(b)(1), unless the authorization is terminated or revoked sooner.     Influenza A by PCR NEGATIVE NEGATIVE Final   Influenza B by PCR NEGATIVE NEGATIVE Final    Comment: (NOTE) The Xpert Xpress SARS-CoV-2/FLU/RSV plus assay is intended as an aid in the diagnosis of influenza from Nasopharyngeal swab specimens and should not be used as a sole basis for treatment. Nasal washings and aspirates are unacceptable for Xpert Xpress SARS-CoV-2/FLU/RSV testing.  Fact Sheet for Patients: EntrepreneurPulse.com.au  Fact Sheet for Healthcare Providers: IncredibleEmployment.be  This test is not yet approved or cleared by the [place] FDA and has been authorized for detection and/or diagnosis of SARS-CoV-2 by FDA under an Emergency Use Authorization (EUA). This EUA will remain in effect (meaning this test can be used) for the duration of the COVID-19 declaration under Section 564(b)(1) of the Act, 21 U.S.C. section 360bbb-3(b)(1), unless the authorization is terminated or revoked.  Performed at [facility], [address] [place], [place] [address]       Radiology Studies: CT Head Wo Contrast  Result Date: 02/19/2021 CLINICAL DATA:  [place], COVID-19 positive, hit head EXAM: CT HEAD WITHOUT CONTRAST TECHNIQUE: Contiguous axial images were obtained from the base of the skull through the vertex without intravenous contrast. COMPARISON:  None. FINDINGS:  Brain: Confluent hypodensities throughout the periventricular and subcortical white matter are most consistent with chronic small vessel ischemic changes. 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 No signs of acute infarct or hemorrhage. There is diffuse cerebral atrophy with ex vacuo dilatation of the lateral ventricles. Midline structures are unremarkable. No acute extra-axial fluid collections. No mass effect. Vascular: Extensive atherosclerosis of the internal carotid arteries. No hyperdense vessel. Skull: Normal. Negative for fracture or focal lesion. Sinuses/Orbits: No acute finding. Other: None. IMPRESSION: 1. Chronic ischemic changes as above. No acute intracranial process. Electronically Signed   By: Diana Eves.D.  On: 02/19/2021 20:50   CT Cervical Spine Wo Contrast  Result Date: 02/19/2021 CLINICAL DATA:  [place], COVID-19 positive EXAM: CT CERVICAL SPINE WITHOUT CONTRAST TECHNIQUE: Multidetector CT imaging of the cervical spine was performed without intravenous contrast. Multiplanar CT image reconstructions were also generated. COMPARISON:  None. FINDINGS: Alignment: Left convex curvature of the cervical spine. Otherwise alignment is anatomic. Skull base and vertebrae: No acute fracture. No primary bone lesion or focal pathologic process. Soft tissues and spinal canal: No prevertebral fluid or swelling. No visible canal hematoma. Disc levels: Multilevel cervical spondylosis and facet hypertrophy, with disc space narrowing most pronounced at C4-5, C5-6, and C6-7. Upper chest: Stable pleural and parenchymal density at the right apex consistent with chronic scarring, without significant change since chest CT 04/13/2019. No acute airspace disease. Central airway is patent. Other: Reconstructed images demonstrate no additional findings. IMPRESSION: 1. No acute cervical spine fracture. 2. Multilevel cervical spondylosis and facet hypertrophy as above. 3. Chronic right apical pleural and parenchymal  scarring. Electronically Signed   By: Randa Ngo M.D.   On: 02/19/2021 20:52   DG Chest Portable 1 View  Result Date: 02/19/2021 CLINICAL DATA:  Fall. Fall at home. History of frequent falls. COVID positive 2 days ago. EXAM: PORTABLE CHEST 1 VIEW COMPARISON:  Radiograph 4 days ago 02/15/2021, CT 3 days ago 02/16/2021. FINDINGS: Right apical pleuroparenchymal scarring. Subsegmental atelectasis at the left lung base. Stable heart size and mediastinal contours. Aortic atherosclerosis. There is no pulmonary edema, pleural effusion, or pneumothorax. No acute osseous abnormalities are seen. IMPRESSION: No acute abnormality. Electronically Signed   By: Keith Rake M.D.   On: 02/19/2021 22:30   CT MAXILLOFACIAL WO CONTRAST  Result Date: 02/19/2021 CLINICAL DATA:  [place], COVID-19 positive, hit head EXAM: CT MAXILLOFACIAL WITHOUT CONTRAST TECHNIQUE: Multidetector CT imaging of the maxillofacial structures was performed. Multiplanar CT image reconstructions were also generated. COMPARISON:  None. FINDINGS: Osseous: There is a minimally displaced left-sided nasal bone fracture, with overlying soft tissue swelling. No other acute facial bone fractures are identified. Orbits: Negative. No traumatic or inflammatory finding. Sinuses: Clear. Soft tissues: Prominent soft tissue swelling along the left aspect of the nasal bridge extending into the left infraorbital region. Remaining soft tissues are unremarkable. Limited intracranial: No significant or unexpected finding. IMPRESSION: 1. Minimally displaced left-sided nasal bone fracture with overlying soft tissue swelling. Electronically Signed   By: Randa Ngo M.D.   On: 02/19/2021 20:54    Scheduled Meds:  amoxicillin  500 mg Oral Q12H   aspirin EC  81 mg Oral Daily   atorvastatin  80 mg Oral Daily   calcitRIOL  0.25 mcg Oral Daily   Chlorhexidine Gluconate Cloth  6 each Topical Daily   cholecalciferol  1,000 Units Oral Daily   citalopram  10 mg Oral  Daily   clopidogrel  75 mg Oral Daily   famotidine  20 mg Oral Daily   finasteride  5 mg Oral Daily   furosemide  40 mg Oral Daily   latanoprost  1 drop Both Eyes QHS   metoprolol tartrate  25 mg Oral BID   pantoprazole  40 mg Oral Daily   tamsulosin  0.8 mg Oral Daily   traZODone  150 mg Oral QHS   Continuous Infusions:   LOS: 2 days   Time spent: 35 minutes.  Patrecia Pour, MD [facility] [email or web address] 02/21/2021, 3:54 PM

## 2021-02-22 DIAGNOSIS — N184 Chronic kidney disease, stage 4 (severe): Secondary | ICD-10-CM | POA: Diagnosis not present

## 2021-02-22 DIAGNOSIS — U071 COVID-19: Secondary | ICD-10-CM | POA: Diagnosis not present

## 2021-02-22 DIAGNOSIS — N390 Urinary tract infection, site not specified: Secondary | ICD-10-CM | POA: Diagnosis not present

## 2021-02-22 DIAGNOSIS — W19XXXA Unspecified fall, initial encounter: Secondary | ICD-10-CM | POA: Diagnosis not present

## 2021-02-22 LAB — CBC WITH DIFFERENTIAL/PLATELET
Abs Immature Granulocytes: 0.03 10*3/uL (ref 0.00–0.07)
Basophils Absolute: 0 10*3/uL (ref 0.0–0.1)
Basophils Relative: 0 %
Eosinophils Absolute: 0 10*3/uL (ref 0.0–0.5)
Eosinophils Relative: 0 %
HCT: 27.2 % — ABNORMAL LOW (ref 39.0–52.0)
Hemoglobin: 9.1 g/dL — ABNORMAL LOW (ref 13.0–17.0)
Immature Granulocytes: 1 %
Lymphocytes Relative: 22 %
Lymphs Abs: 1 10*3/uL (ref 0.7–4.0)
MCH: 33.1 pg (ref 26.0–34.0)
MCHC: 33.5 g/dL (ref 30.0–36.0)
MCV: 98.9 fL (ref 80.0–100.0)
Monocytes Absolute: 0.4 10*3/uL (ref 0.1–1.0)
Monocytes Relative: 9 %
Neutro Abs: 3 10*3/uL (ref 1.7–7.7)
Neutrophils Relative %: 68 %
Platelets: 102 10*3/uL — ABNORMAL LOW (ref 150–400)
RBC: 2.75 MIL/uL — ABNORMAL LOW (ref 4.22–5.81)
RDW: 13.7 % (ref 11.5–15.5)
WBC: 4.5 10*3/uL (ref 4.0–10.5)
nRBC: 0 % (ref 0.0–0.2)

## 2021-02-22 NOTE — Plan of Care (Signed)
Patient had 2 large diarrhea episodes during this shift. Pleasant and following commands. A&O x2 self and place. Daughter called, returned call and updated on patient status.   Problem: Safety: Goal: Non-violent Restraint(s) Outcome: Progressing   Problem: Clinical Measurements: Goal: Respiratory complications will improve Outcome: Progressing   Problem: Nutrition: Goal: Adequate nutrition will be maintained Outcome: Progressing   Problem: Pain Managment: Goal: General experience of comfort will improve Outcome: Progressing   Problem: Safety: Goal: Ability to remain free from injury will improve Outcome: Progressing

## 2021-02-22 NOTE — Progress Notes (Addendum)
PROGRESS NOTE  Max Ellis  [id number] DOB: [birth year] DOA: 02/19/2021 PCP: [place] Pink, MD   Brief Narrative: Max Ellis is an 87yo male with a history of COPD, HTN, PVD, CKD stage IV, recent NSTEMI and covid-19 infection. He received medical treatment for NSTEMI with improvement in chest pain and troponin elevation, cleared for discharge by cardiology. Also received monoclonal antibody for covid-19 infection on 7/20. Despite multiple recommendations for discharge to SNF, the patient and family declined and the patient was discharged home with maximal home health support only to fall when getting out of bed at home the night of discharge prompting return to the hospital 7/20. He had a low grade fever without normal WBC. CT head and cervical spine were without acute changes.   Assessment & Plan: Principal Problem:   Fall Active Problems:   Essential hypertension   Tobacco use disorder   Chronic kidney disease, stage 4 (severe) (HCC)   NSTEMI (non-ST elevated myocardial infarction) ([place])   COVID-19 virus infection   Acute lower UTI  UTI:  - Continue amoxicillin x7 days (put stop date) per culture data (sensitive enterococcus).   BPH, urinary retention: Passed voiding trial this hospitalization. - Continue home medications.    Acute metabolic encephalopathy: Neuroimaging on readmission was negative - Avoid ativan as much as possible. Pt did not have significant agitation/delirium during past hospitalization, so suspect this may be related to covid infection. Also may have contribution by concussion. Mentation improved on 7/22 and much closer to baseline on 7/23. Again, would avoid sedating medications as much as possible.  Covid-19 infection: Negative CXR. Possible sequelae instead are weakness, encephalopathy.  - s/p MAB 7/20. - Remdesivir not strictly contraindicated with severe renal dysfunction, though potential benefit (very limited, and not a mortality benefit in available  studies to date) is not likely to justify risk at this time. 100% on room air, so no steroids. - CRP is elevated modestly, though no respiratory symptoms to date.  - Remain on isolation x10 days.  Fall, generalized weakness: Pt returned from home with fall less than 24 hours after discharge. - PT and OT to continue. Suspect his weakness is attributable to covid infection and is persistent on chronic deconditioning/advanced age. Strongly suspect SNF discharge would be safest option for this patient. TOC on board.  CAD, recent NSTEMI: No anginal complaints at this time.  - Continue DAPT, statin, beta blocker - Still will need cardiology follow up.   Stage IV CKD: SCr remains at baseline. - Avoid nephrotoxins, continue home medications. Avoiding IVF, pushing po.  GERD:  - PPI  Anemia of CKD: At presumed baseline. No ongoing bleeding despite diffuse bruising (which is not progressive since admission)  Thrombocytopenia: Suspected due to covid, stabilized. With head trauma, downtrending platelets and baseline low hgb, will hold anticoagulation at this time.   Tobacco use:  - Nicotine patch  HTN: BP elevated but not severely so. Consider addition of norvasc if continues, though wish to avoid orthostatic hypotension, so will monitor only for now.   COPD:  - Continue BDs, no exacerbation.   DVT prophylaxis: SCDs Code Status: DNR Family Communication: None at bedside Disposition Plan:  Status is: Inpatient  Remains inpatient appropriate because:Altered mental status and Unsafe d/c plan  Dispo: The patient is from: Home              Anticipated d/c is to: SNF              Patient currently is not  medically stable to d/c.   Difficult to place patient No  Consultants:  None  Procedures:  None  Antimicrobials: Amoxicillin   Subjective: Much more alert this morning. Worked with PT to get to chair. Ate breakfast. No chest pain or dyspnea. He's frustrated with his weakness and the  recommendation to go to SNF. Diarrhea was reported yesterday but pt does not mention this and denied abdominal pain, nausea or vomiting as well.  Objective: Vitals:   02/21/21 1959 02/21/21 2324 02/22/21 0547 02/22/21 0736  BP: (!) 156/58 (!) 173/64 (!) 154/51 (!) 150/47  Pulse: 67 60 (!) 56 (!) 51  Resp: '18 16 18 15  '$ Temp: (!) 97.5 F (36.4 C) 98 F (36.7 C) (!) 97.5 F (36.4 C) 97.8 F (36.6 C)  TempSrc:      SpO2: 97% 100% 100% 98%  Weight:      Height:       No intake or output data in the 24 hours ending 02/22/21 1027  Filed Weights   02/19/21 2056  Weight: 63.5 kg   Gen: Alert elderly, frail male in no distress Pulm: Nonlabored breathing room air. Clear. CV: Regular rate and rhythm. No murmur, rub, or gallop. No JVD, no pitting dependent edema. GI: Abdomen soft, non-tender, non-distended, with normoactive bowel sounds.  Ext: Warm, no deformities, dry with scattered significant but stable ecchymoses. Skin: No new rashes, lesions or ulcers on visualized skin. N[place] bridge lac is shallow and hemostatic, healing. Neuro: Alert and oriented. No focal neurological deficits. Psych: Judgement and insight appear fair.   Data Reviewed: I have personally reviewed following labs and imaging studies  CBC: Recent Labs  Lab 02/19/21 0616 02/19/21 2231 02/20/21 0331 02/21/21 0519 02/22/21 0525  WBC 4.6 6.0 4.9 4.9 4.5  NEUTROABS 3.6 4.9  --  3.4 3.0  HGB 8.5* 9.9* 8.5* 9.8* 9.1*  HCT 25.8* 30.1* 25.8* 29.5* 27.2*  MCV 99.2 99.0 100.4* 99.0 98.9  PLT 121* 124* 106* 105* [id number]*   Basic Metabolic Panel: Recent Labs  Lab 02/18/21 0257 02/18/21 1220 02/19/21 0616 02/19/21 2231 02/20/21 0331 02/21/21 0519  NA 138  --  137 138 138 141  K 5.8* 5.0 3.8 4.0 3.6 4.1  CL 110  --  111 108 110 112*  CO2 22  --  23 19* 24 22  GLUCOSE 77  --  99 105* 100* 76  BUN 38*  --  32* 32* 34* 37*  CREATININE 3.14*  --  3.11* 3.16* 3.09* 3.05*  CALCIUM 8.5*  --  8.0* 8.6* 8.2* 8.4*  MG 2.1   --   --   --   --   --    GFR: Estimated Creatinine Clearance: 15.9 mL/min (A) (by C-G formula based on SCr of 3.05 mg/dL (H)). Liver Function Tests: Recent Labs  Lab 02/19/21 2231 02/20/21 0331 02/21/21 0519  AST 34 26 28  ALT '13 15 13  '$ ALKPHOS 57 47 48  BILITOT 1.0 0.6 0.8  PROT 6.5 5.6* 5.6*  ALBUMIN 3.3* 3.0* 2.8*   No results for input(s): LIPASE, AMYLASE in the last 168 hours. No results for input(s): AMMONIA in the last 168 hours. Coagulation Profile: No results for input(s): INR, PROTIME in the last 168 hours. Cardiac Enzymes: No results for input(s): CKTOTAL, CKMB, CKMBINDEX, TROPONINI in the last 168 hours. BNP (last 3 results) No results for input(s): PROBNP in the last 8760 hours. HbA1C: No results for input(s): HGBA1C in the last 72 hours. CBG: Recent Labs  Lab 02/19/21 0750  GLUCAP 104*   Lipid Profile: No results for input(s): CHOL, HDL, LDLCALC, TRIG, CHOLHDL, LDLDIRECT in the last 72 hours. Thyroid Function Tests: No results for input(s): TSH, T4TOTAL, FREET4, T3FREE, THYROIDAB in the last 72 hours. Anemia Panel: No results for input(s): VITAMINB12, FOLATE, FERRITIN, TIBC, IRON, RETICCTPCT in the last 72 hours. Urine analysis:    Component Value Date/Time   COLORURINE YELLOW (A) 02/16/2021 1246   APPEARANCEUR CLEAR (A) 02/16/2021 1246   APPEARANCEUR Clear 01/02/2021 1358   LABSPEC 1.013 02/16/2021 1246   PHURINE 6.0 02/16/2021 1246   GLUCOSEU NEGATIVE 02/16/2021 1246   HGBUR NEGATIVE 02/16/2021 1246   BILIRUBINUR NEGATIVE 02/16/2021 1246   BILIRUBINUR Negative 01/02/2021 1358   [place] 02/16/2021 1246   PROTEINUR NEGATIVE 02/16/2021 1246   NITRITE NEGATIVE 02/16/2021 1246   LEUKOCYTESUR TRACE (A) 02/16/2021 1246   Recent Results (from the past 240 hour(s))  Resp Panel by RT-PCR (Flu A&B, Covid) Nasopharyngeal Swab     Status: None   Collection Time: 02/16/21  2:56 AM   Specimen: Nasopharyngeal Swab; Nasopharyngeal(NP) swabs in  vial transport medium  Result Value Ref Range Status   SARS Coronavirus 2 by RT PCR NEGATIVE NEGATIVE Final    Comment: (NOTE) SARS-CoV-2 target nucleic acids are NOT DETECTED.  The SARS-CoV-2 RNA is generally detectable in upper respiratory specimens during the acute phase of infection. The lowest concentration of SARS-CoV-2 viral copies this assay can detect is 138 copies/mL. A negative result does not preclude SARS-Cov-2 infection and should not be used as the sole basis for treatment or other patient management decisions. A negative result may occur with  improper specimen collection/handling, submission of specimen other than nasopharyngeal swab, presence of viral mutation(s) within the areas targeted by this assay, and inadequate number of viral copies(<138 copies/mL). A negative result must be combined with clinical observations, patient history, and epidemiological information. The expected result is Negative.  Fact Sheet for Patients:  EntrepreneurPulse.com.au  Fact Sheet for Healthcare Providers:  IncredibleEmployment.be  This test is no t yet approved or cleared by the [place] FDA and  has been authorized for detection and/or diagnosis of SARS-CoV-2 by FDA under an Emergency Use Authorization (EUA). This EUA will remain  in effect (meaning this test can be used) for the duration of the COVID-19 declaration under Section 564(b)(1) of the Act, 21 U.S.C.section 360bbb-3(b)(1), unless the authorization is terminated  or revoked sooner.       Influenza A by PCR NEGATIVE NEGATIVE Final   Influenza B by PCR NEGATIVE NEGATIVE Final    Comment: (NOTE) The Xpert Xpress SARS-CoV-2/FLU/RSV plus assay is intended as an aid in the diagnosis of influenza from Nasopharyngeal swab specimens and should not be used as a sole basis for treatment. Nasal washings and aspirates are unacceptable for Xpert Xpress SARS-CoV-2/FLU/RSV testing.  Fact  Sheet for Patients: EntrepreneurPulse.com.au  Fact Sheet for Healthcare Providers: IncredibleEmployment.be  This test is not yet approved or cleared by the [place] FDA and has been authorized for detection and/or diagnosis of SARS-CoV-2 by FDA under an Emergency Use Authorization (EUA). This EUA will remain in effect (meaning this test can be used) for the duration of the COVID-19 declaration under Section 564(b)(1) of the Act, 21 U.S.C. section 360bbb-3(b)(1), unless the authorization is terminated or revoked.  Performed at [facility], [address], [place], [place] [address]   Urine Culture     Status: Abnormal   Collection Time: 02/16/21 12:46 PM  Specimen: Urine, Clean Catch  Result Value Ref Range Status   Specimen Description   Final    URINE, CLEAN CATCH Performed at [facility], [address] [place], [place] [address]    Special Requests   Final    NONE Performed at [facility], [place], [place] [address]    Culture 30,000 COLONIES/mL ENTEROCOCCUS FAECALIS (A)  Final   Report Status 02/18/2021 FINAL  Final   Organism ID, Bacteria ENTEROCOCCUS FAECALIS (A)  Final      Susceptibility   Enterococcus faecalis - MIC*    AMPICILLIN <=2 SENSITIVE Sensitive     NITROFURANTOIN <=16 SENSITIVE Sensitive     VANCOMYCIN <=0.5 SENSITIVE Sensitive     * 30,000 COLONIES/mL ENTEROCOCCUS FAECALIS  Resp Panel by RT-PCR (Flu A&B, Covid) Nasopharyngeal Swab     Status: Abnormal   Collection Time: 02/18/21 10:20 AM   Specimen: Nasopharyngeal Swab; Nasopharyngeal(NP) swabs in vial transport medium  Result Value Ref Range Status   SARS Coronavirus 2 by RT PCR POSITIVE (A) NEGATIVE Final    Comment: RESULT CALLED TO, READ BACK BY AND VERIFIED WITH: Matilde Sprang, RN [id number]. 02/18/2021 GAA (NOTE) SARS-CoV-2 target nucleic acids are DETECTED.  The SARS-CoV-2 RNA is generally detectable in upper  respiratory specimens during the acute phase of infection. Positive results are indicative of the presence of the identified virus, but do not rule out bacterial infection or co-infection with other pathogens not detected by the test. Clinical correlation with patient history and other diagnostic information is necessary to determine patient infection status. The expected result is Negative.  Fact Sheet for Patients: EntrepreneurPulse.com.au  Fact Sheet for Healthcare Providers: IncredibleEmployment.be  This test is not yet approved or cleared by the [place] FDA and  has been authorized for detection and/or diagnosis of SARS-CoV-2 by FDA under an Emergency Use Authorization (EUA).  This EUA will remain in effect (meaning this test can  be used) for the duration of  the COVID-19 declaration under Section 564(b)(1) of the Act, 21 U.S.C. section 360bbb-3(b)(1), unless the authorization is terminated or revoked sooner.     Influenza A by PCR NEGATIVE NEGATIVE Final   Influenza B by PCR NEGATIVE NEGATIVE Final    Comment: (NOTE) The Xpert Xpress SARS-CoV-2/FLU/RSV plus assay is intended as an aid in the diagnosis of influenza from Nasopharyngeal swab specimens and should not be used as a sole basis for treatment. Nasal washings and aspirates are unacceptable for Xpert Xpress SARS-CoV-2/FLU/RSV testing.  Fact Sheet for Patients: EntrepreneurPulse.com.au  Fact Sheet for Healthcare Providers: IncredibleEmployment.be  This test is not yet approved or cleared by the [place] FDA and has been authorized for detection and/or diagnosis of SARS-CoV-2 by FDA under an Emergency Use Authorization (EUA). This EUA will remain in effect (meaning this test can be used) for the duration of the COVID-19 declaration under Section 564(b)(1) of the Act, 21 U.S.C. section 360bbb-3(b)(1), unless the authorization is  terminated or revoked.  Performed at [facility], [address] [place], [place] [address]       Radiology Studies: No results found.  Scheduled Meds:  amoxicillin  500 mg Oral Q12H   aspirin EC  81 mg Oral Daily   atorvastatin  80 mg Oral Daily   calcitRIOL  0.25 mcg Oral Daily   cholecalciferol  1,000 Units Oral Daily   citalopram  10 mg Oral Daily   clopidogrel  75 mg Oral Daily   famotidine  20  mg Oral Daily   finasteride  5 mg Oral Daily   furosemide  40 mg Oral Daily   latanoprost  1 drop Both Eyes QHS   metoprolol tartrate  25 mg Oral BID   pantoprazole  40 mg Oral Daily   tamsulosin  0.8 mg Oral Daily   traZODone  150 mg Oral QHS   Continuous Infusions:   LOS: 3 days   Time spent: 25 minutes.  Patrecia Pour, MD [facility] [email or web address] 02/22/2021, 10:27 AM

## 2021-02-22 NOTE — Evaluation (Signed)
Physical Therapy Evaluation Patient Details Name: Max Ellis MRN: [id number] DOB: 18-Mar-1934 Today's Date: 02/22/2021   History of Present Illness  Max Ellis is an 87yo male with a history of COPD, HTN, PVD, CKD stage IV, recent NSTEMI and covid-19 infection. He received medical treatment for NSTEMI with improvement in chest pain and troponin elevation, cleared for discharge by cardiology. Also received monoclonal antibody for covid-19 infection on 7/20. Despite multiple recommendations for discharge to SNF, the patient and family declined and the patient was discharged home with maximal home health support only to fall when getting out of bed at home the night of discharge prompting return to the hospital 7/20. He had a low grade fever without normal WBC. CT head and cervical spine were without acute changes.  Clinical Impression  Pt seen for PT evaluation with pt reporting he returned home alone with intermittent assistance following d/c from last hospitalization. On this date, pt performs bed mobility with supervision & transfers with RW & CGA<>min assist. Pt is able to ambulate in room with RW with decreased gait speed and improved ability to maneuver RW & doesn't hit objects as frequently as he did the last time this PT saw him. Pt requires a seated rest break 2/2 his legs feeling weak before attempting gait without AD, during which pt requires min assist & demonstrates shuffled, unsteady gait pattern. PT educates pt on recommendation of STR upon d/c to maximize independence with mobility & reduce fall risk prior to return home but pt continues to report he's going home. Will continue to follow pt acutely to progress gait with LRAD & address balance & safety with mobility.  Attempted to review use of call bell with pt with pt unable to follow instructions. Notified nurse secretary of pt's need for single button call bell.    Follow Up Recommendations SNF;Supervision/Assistance - 24 hour     Equipment Recommendations  Rolling walker with 5" wheels;3in1 (PT)    Recommendations for Other Services       Precautions / Restrictions Precautions Precautions: Fall Restrictions Weight Bearing Restrictions: No      Mobility  Bed Mobility Overal bed mobility: Needs Assistance Bed Mobility: Supine to Sit     Supine to sit: Supervision;HOB elevated     General bed mobility comments: bed rails    Transfers Overall transfer level: Needs assistance Equipment used: Rolling walker (2 wheeled) Transfers: Sit to/from [facility] Sit to Stand: Min guard;Min assist Stand pivot transfers: Min guard;Min assist       General transfer comment: cuing for safe hand placement  Ambulation/Gait Ambulation/Gait assistance: Min assist Gait Distance (Feet): 50 Feet (+ 25 ft) Assistive device: Rolling walker (2 wheeled);None Gait Pattern/deviations: Decreased step length - left;Decreased step length - right;Trunk flexed     General Gait Details: Pt ambulates 50 ft with RW & decreased step & stride length, decreased gait speed. Pt ambulates additional 25 ft without AD & min assist with shuffled gait & cuing not to reach for objects for support.  Stairs            Wheelchair Mobility    Modified Rankin (Stroke Patients Only)       Balance Overall balance assessment: Needs assistance Sitting-balance support: No upper extremity supported;Feet supported Sitting balance-Leahy Scale: Good Sitting balance - Comments: supervision static sitting EOB   Standing balance support: Bilateral upper extremity supported;No upper extremity supported;During functional activity Standing balance-Leahy Scale: Fair  Pertinent Vitals/Pain Pain Assessment: No/denies pain    Home Living Family/patient expects to be discharged to:: Private residence Living Arrangements: Alone Available Help at Discharge: Family;Available  PRN/intermittently Type of Home: House Home Access: Ramped entrance     Home Layout: One level Home Equipment: None      Prior Function Level of Independence: Needs assistance   Gait / Transfers Assistance Needed: ambulates without AD, denies falls in the past 6 months up until this one  ADL's / Homemaking Assistance Needed: family assists with transportation, meals/groceries, family assists pt into the shower but he bathes himself, family also will "check over" pt's medications  Comments: Per grandson, pt with very impaired vision 2/2 glaucoma, pt wears glasses, wife passed away ~6 weeks ago     Hand Dominance   Dominant Hand: Left    Extremity/Trunk Assessment   Upper Extremity Assessment Upper Extremity Assessment: Generalized weakness    Lower Extremity Assessment Lower Extremity Assessment: Generalized weakness    Cervical / Trunk Assessment Cervical / Trunk Assessment: Kyphotic (bruising noted to B eyes, L UE)  Communication   Communication: No difficulties  Cognition Arousal/Alertness: Awake/alert Behavior During Therapy: WFL for tasks assessed/performed Overall Cognitive Status: No family/caregiver present to determine baseline cognitive functioning                                 General Comments: Pt oriented to time but year only, oriented to location. Poor safety awareness.      General Comments General comments (skin integrity, edema, etc.): after gait HR 66 bpm, SpO2 100% on room air    Exercises     Assessment/Plan    PT Assessment Patient needs continued PT services  PT Problem List Decreased strength;Decreased mobility;Decreased safety awareness;Decreased activity tolerance;Cardiopulmonary status limiting activity;Decreased knowledge of use of DME;Decreased balance;Decreased cognition       PT Treatment Interventions DME instruction;Therapeutic activities;Cognitive remediation;Modalities;Gait training;Therapeutic  exercise;Patient/family education;Stair training;Balance training;Functional mobility training;Neuromuscular re-education;Manual techniques    PT Goals (Current goals can be found in the Care Plan section)  Acute Rehab PT Goals Patient Stated Goal: go home PT Goal Formulation: With patient Time For Goal Achievement: 03/08/21 Potential to Achieve Goals: Fair    Frequency Min 2X/week   Barriers to discharge Decreased caregiver support lives alone    Co-evaluation               AM-PAC PT "6 Clicks" Mobility  Outcome Measure Help needed turning from your back to your side while in a flat bed without using bedrails?: None Help needed moving from lying on your back to sitting on the side of a flat bed without using bedrails?: A Little Help needed moving to and from a bed to a chair (including a wheelchair)?: A Little Help needed standing up from a chair using your arms (e.g., wheelchair or bedside chair)?: A Little Help needed to walk in hospital room?: A Little Help needed climbing 3-5 steps with a railing? : A Lot 6 Click Score: 18    End of Session Equipment Utilized During Treatment: Gait belt Activity Tolerance: Patient tolerated treatment well Patient left: in chair;with chair alarm set;with call bell/phone within reach Nurse Communication: Mobility status (need for single push button call bell) PT Visit Diagnosis: Muscle weakness (generalized) (M62.81);Difficulty in walking, not elsewhere classified (R26.2);Unsteadiness on feet (R26.81)    Time: [id number] PT Time Calculation (min) (ACUTE ONLY): 25 min   Charges:  PT Evaluation $PT Eval Moderate Complexity: 1 Mod PT Treatments $Therapeutic Activity: 8-22 mins        Lavone Nian, PT, DPT 02/22/21, 2:20 PM   Waunita Schooner 02/22/2021, 2:17 PM

## 2021-02-23 DIAGNOSIS — W19XXXA Unspecified fall, initial encounter: Secondary | ICD-10-CM | POA: Diagnosis not present

## 2021-02-23 DIAGNOSIS — U071 COVID-19: Secondary | ICD-10-CM | POA: Diagnosis not present

## 2021-02-23 DIAGNOSIS — N390 Urinary tract infection, site not specified: Secondary | ICD-10-CM | POA: Diagnosis not present

## 2021-02-23 DIAGNOSIS — N184 Chronic kidney disease, stage 4 (severe): Secondary | ICD-10-CM | POA: Diagnosis not present

## 2021-02-23 LAB — CBC WITH DIFFERENTIAL/PLATELET
Abs Immature Granulocytes: 0.02 10*3/uL (ref 0.00–0.07)
Basophils Absolute: 0 10*3/uL (ref 0.0–0.1)
Basophils Relative: 0 %
Eosinophils Absolute: 0 10*3/uL (ref 0.0–0.5)
Eosinophils Relative: 1 %
HCT: 27.7 % — ABNORMAL LOW (ref 39.0–52.0)
Hemoglobin: 9.2 g/dL — ABNORMAL LOW (ref 13.0–17.0)
Immature Granulocytes: 0 %
Lymphocytes Relative: 27 %
Lymphs Abs: 1.2 10*3/uL (ref 0.7–4.0)
MCH: 32.1 pg (ref 26.0–34.0)
MCHC: 33.2 g/dL (ref 30.0–36.0)
MCV: 96.5 fL (ref 80.0–100.0)
Monocytes Absolute: 0.5 10*3/uL (ref 0.1–1.0)
Monocytes Relative: 11 %
Neutro Abs: 2.7 10*3/uL (ref 1.7–7.7)
Neutrophils Relative %: 61 %
Platelets: 98 10*3/uL — ABNORMAL LOW (ref 150–400)
RBC: 2.87 MIL/uL — ABNORMAL LOW (ref 4.22–5.81)
RDW: 13.6 % (ref 11.5–15.5)
WBC: 4.5 10*3/uL (ref 4.0–10.5)
nRBC: 0 % (ref 0.0–0.2)

## 2021-02-23 LAB — IMMATURE PLATELET FRACTION: Immature Platelet Fraction: 11.2 % — ABNORMAL HIGH (ref 1.2–8.6)

## 2021-02-23 MED ORDER — IPRATROPIUM-ALBUTEROL 20-100 MCG/ACT IN AERS
1.0000 | INHALATION_SPRAY | Freq: Four times a day (QID) | RESPIRATORY_TRACT | Status: DC
  Administered 2021-02-23 – 2021-03-03 (×29): 1 via RESPIRATORY_TRACT
  Filled 2021-02-23: qty 4

## 2021-02-23 MED ORDER — ALBUTEROL SULFATE HFA 108 (90 BASE) MCG/ACT IN AERS
2.0000 | INHALATION_SPRAY | RESPIRATORY_TRACT | Status: DC | PRN
  Filled 2021-02-23 (×2): qty 6.7

## 2021-02-23 MED ORDER — PREDNISONE 20 MG PO TABS
40.0000 mg | ORAL_TABLET | Freq: Every day | ORAL | Status: AC
Start: 2021-02-23 — End: 2021-02-27
  Administered 2021-02-23 – 2021-02-27 (×5): 40 mg via ORAL
  Filled 2021-02-23 (×5): qty 2

## 2021-02-23 MED ORDER — PREDNISONE 20 MG PO TABS: 40.0000 mg | ORAL_TABLET | Freq: Every day | ORAL | Status: DC

## 2021-02-23 NOTE — NC FL2 (Signed)
[place] LEVEL OF CARE SCREENING TOOL     IDENTIFICATION  Patient Name: Max Ellis Birthdate: [birth year] Sex: male Admission Date (Current Location): 02/19/2021  [facility] and [place] Number:  Engineering geologist and Address:  [facility], [address], [place], [place] [address]      Provider Number: [id number]  Attending Physician Name and Address:  Patrecia Pour, MD  Relative Name and Phone Number:  Rush Landmark [phone]    Current Level of Care: Hospital Recommended Level of Care: [place] Prior Approval Number:    Date Approved/Denied:   PASRR Number: [id number]  Discharge Plan:      Current Diagnoses: Patient Active Problem List   Diagnosis Date Noted   Fall 02/19/2021   Acute lower UTI 02/19/2021   COVID-19 virus infection 02/18/2021   NSTEMI (non-ST elevated myocardial infarction) ([place]) 02/17/2021   Hypertensive urgency 02/16/2021   Acute urinary retention 02/16/2021   Chronic kidney disease, stage 4 (severe) (HCC) 02/16/2021   Benign prostatic hyperplasia with incomplete bladder emptying 03/03/2020   Essential hypertension 01/26/2017   Tobacco use disorder 01/26/2017   Pain in limb 01/26/2017    Orientation RESPIRATION BLADDER Height & Weight     Self, Place  Normal Incontinent Weight: 140 lb (63.5 kg) Height:  '6\' 2"'$  (188 cm)  BEHAVIORAL SYMPTOMS/MOOD NEUROLOGICAL BOWEL NUTRITION STATUS      Incontinent Diet (heart diet, thin liquids)  AMBULATORY STATUS COMMUNICATION OF NEEDS Skin   Limited Assist Verbally Bruising                       Personal Care Assistance Level of Assistance  Bathing, Feeding, Dressing Bathing Assistance: Limited assistance Feeding assistance: Limited assistance Dressing Assistance: Limited assistance     Functional Limitations Info             SPECIAL CARE FACTORS FREQUENCY  PT (By licensed PT), OT (By licensed OT)     PT Frequency: 5 x/week OT  Frequency: 5 x/week            Contractures      Additional Factors Info  Code Status, Allergies Code Status Info: DNR Allergies Info: nka           Current Medications (02/23/2021):  This is the current hospital active medication list Current Facility-Administered Medications  Medication Dose Route Frequency Provider Last Rate Last Admin   acetaminophen (TYLENOL) tablet 650 mg  650 mg Oral Q6H PRN Elwyn Reach, MD   650 mg at 02/22/21 0544   Or   acetaminophen (TYLENOL) suppository 650 mg  650 mg Rectal Q6H PRN Elwyn Reach, MD       albuterol (VENTOLIN HFA) 108 (90 Base) MCG/ACT inhaler 2 puff  2 puff Inhalation Q4H PRN Patrecia Pour, MD       amoxicillin (AMOXIL) capsule 500 mg  500 mg Oral Q12H Vance Gather B, MD   500 mg at 02/23/21 1029   aspirin EC tablet 81 mg  81 mg Oral Daily Gala Romney L, MD   81 mg at 02/23/21 1028   atorvastatin (LIPITOR) tablet 80 mg  80 mg Oral Daily Gala Romney L, MD   80 mg at 02/23/21 1028   calcitRIOL (ROCALTROL) capsule 0.25 mcg  0.25 mcg Oral Daily Gala Romney L, MD   0.25 mcg at 02/23/21 1030   cholecalciferol (VITAMIN D3) tablet 1,000 Units  1,000 Units Oral Daily Elwyn Reach, MD  1,000 Units at 02/23/21 1028   citalopram (CELEXA) tablet 10 mg  10 mg Oral Daily Gala Romney L, MD   10 mg at 02/23/21 1028   clopidogrel (PLAVIX) tablet 75 mg  75 mg Oral Daily Gala Romney L, MD   75 mg at 02/23/21 1028   famotidine (PEPCID) tablet 20 mg  20 mg Oral Daily Dallie Piles, RPH   20 mg at 02/23/21 1029   finasteride (PROSCAR) tablet 5 mg  5 mg Oral Daily Gala Romney L, MD   5 mg at 02/23/21 1028   furosemide (LASIX) tablet 40 mg  40 mg Oral Daily Gala Romney L, MD   40 mg at 02/23/21 1028   Ipratropium-Albuterol (COMBIVENT) respimat 1 puff  1 puff Inhalation Q6H Patrecia Pour, MD       latanoprost (XALATAN) 0.005 % ophthalmic solution 1 drop  1 drop Both Eyes QHS Gala Romney L, MD   1 drop at 02/22/21  2049   metoprolol tartrate (LOPRESSOR) tablet 25 mg  25 mg Oral BID Gala Romney L, MD   25 mg at 02/23/21 1029   ondansetron (ZOFRAN) tablet 4 mg  4 mg Oral Q6H PRN Elwyn Reach, MD       Or   ondansetron (ZOFRAN) injection 4 mg  4 mg Intravenous Q6H PRN Elwyn Reach, MD       pantoprazole (PROTONIX) EC tablet 40 mg  40 mg Oral Daily Gala Romney L, MD   40 mg at 02/23/21 1028   predniSONE (DELTASONE) tablet 40 mg  40 mg Oral Q breakfast Patrecia Pour, MD       tamsulosin (FLOMAX) capsule 0.8 mg  0.8 mg Oral Daily Jonelle Sidle, Mohammad L, MD   0.8 mg at 02/23/21 1029   traZODone (DESYREL) tablet 150 mg  150 mg Oral QHS Elwyn Reach, MD   150 mg at 02/22/21 2049     Discharge Medications: Please see discharge summary for a list of discharge medications.  Relevant Imaging Results:  Relevant Lab Results:   Additional Information SS #: [id number]  [place], LCSW

## 2021-02-23 NOTE — Progress Notes (Signed)
PROGRESS NOTE  Max Ellis  [id number] DOB: 04-03-1934 DOA: 02/19/2021 PCP: [place] Pink, MD   Brief Narrative: Max Ellis is an 87yo male with a history of COPD, HTN, PVD, CKD stage IV, recent NSTEMI and covid-19 infection. He received medical treatment for NSTEMI with improvement in chest pain and troponin elevation, cleared for discharge by cardiology. Also received monoclonal antibody for covid-19 infection on 7/20. Despite multiple recommendations for discharge to SNF, the patient and family declined and the patient was discharged home with maximal home health support only to fall when getting out of bed at home the night of discharge prompting return to the hospital 7/20. He had a low grade fever without normal WBC. CT head and cervical spine were without acute changes.   Assessment & Plan: Principal Problem:   Fall Active Problems:   Essential hypertension   Tobacco use disorder   Chronic kidney disease, stage 4 (severe) (HCC)   NSTEMI (non-ST elevated myocardial infarction) ([place])   COVID-19 virus infection   Acute lower UTI  UTI:  - Continue amoxicillin x7 days (put stop date) per culture data (sensitive enterococcus).   BPH, urinary retention: Passed voiding trial this hospitalization. - Continue home medications.    Acute metabolic encephalopathy: Neuroimaging on readmission was negative - Avoid ativan as much as possible. Pt did not have significant agitation/delirium during past hospitalization, so suspect this may be related to covid infection. Also may have contribution by concussion. Mentation improved on 7/22 and much closer to baseline on 7/23. On 7/24 he is confirmed to be oriented to his situation.   Covid-19 infection: Negative CXR. Possible sequelae instead are weakness, encephalopathy.  - s/p MAB 7/20. - Remdesivir not strictly contraindicated with severe renal dysfunction, though potential benefit (very limited, and not a mortality benefit in available  studies to date) is not likely to justify risk at this time. 100% on room air, so no steroids. - CRP is elevated modestly, though no respiratory symptoms to date.  - Remain on isolation x10 days.  Fall, generalized weakness: Pt returned from home with fall less than 24 hours after discharge. - PT and OT to continue. Suspect his weakness is attributable to covid infection and is persistent on chronic deconditioning/advanced age. Strongly recommend SNF at discharge given his immediate readmission and continued PT/OT recommendations.   CAD, recent NSTEMI: No anginal complaints at this time.  - Continue DAPT, statin, beta blocker - Still will need cardiology follow up.   Stage IV CKD: SCr remains at baseline. - Avoid nephrotoxins, continue home medications. Avoiding IVF, pushing po.  GERD:  - PPI  Anemia of CKD: At presumed baseline. No ongoing bleeding despite diffuse bruising (which is not progressive since admission)  Thrombocytopenia: Suspected due to covid. Slightly worse today.  - Check again in AM with IPF.  Tobacco use:  - Nicotine patch  HTN:  - No changes currently planned.  COPD with acute exacerbation:  - Continue BDs by inhaler (not neb) - schedule combivent, prn albuterol. Start prednisone given expiratory wheezing.   DVT prophylaxis: SCDs due to thrombocytopenia and widespread ecchymoses and fall risk Code Status: DNR Family Communication: None at bedside Disposition Plan:  Status is: Inpatient  Remains inpatient appropriate because:Altered mental status and Unsafe d/c plan  Dispo: The patient is from: Home              Anticipated d/c is to: SNF              Patient currently  is not medically stable to d/c.   Difficult to place patient No  Consultants:  None  Procedures:  None  Antimicrobials: Amoxicillin   Subjective: Started wheezing over past 24 hours, though denies chest pain or dyspnea. Feels weak, but insists he will be safe to be at home with  only intermittent supervision. He flatly refused SNF at this time. I encouraged him to discuss it with his family and those who will need to be taking care of him.   Objective: Vitals:   02/22/21 1738 02/22/21 2048 02/23/21 0451 02/23/21 0923  BP: (!) 148/53 (!) 150/58 (!) 145/57 (!) 130/51  Pulse: (!) 57 63 (!) 52 (!) 57  Resp: '16 16 16 18  '$ Temp: 98 F (36.7 C) 97.8 F (36.6 C) 97.8 F (36.6 C) 97.8 F (36.6 C)  TempSrc:      SpO2: 99% 98% 100% 100%  Weight:      Height:       No intake or output data in the 24 hours ending 02/23/21 1302  Filed Weights   02/19/21 2056  Weight: 63.5 kg   Gen: Frail, elderly male in no distress Pulm: Nonlabored breathing with expiratory rhonchi and wheezes. CV: Regular rate and rhythm. No murmur, rub, or gallop. No JVD, minimal dependent edema. GI: Abdomen soft, non-tender, non-distended, with normoactive bowel sounds.  Ext: Warm, no deformities Skin: No new rashes, lesions or ulcers on visualized skin. Neuro: Alert and oriented x4 without focal neurological deficits. Psych: Judgement is poor and insight is marginal. Mood euthymic & affect congruent. Behavior is appropriate.    Data Reviewed: I have personally reviewed following labs and imaging studies  CBC: Recent Labs  Lab 02/19/21 0616 02/19/21 2231 02/20/21 0331 02/21/21 0519 02/22/21 0525 02/23/21 0510  WBC 4.6 6.0 4.9 4.9 4.5 4.5  NEUTROABS 3.6 4.9  --  3.4 3.0 2.7  HGB 8.5* 9.9* 8.5* 9.8* 9.1* 9.2*  HCT 25.8* 30.1* 25.8* 29.5* 27.2* 27.7*  MCV 99.2 99.0 100.4* 99.0 98.9 96.5  PLT 121* 124* 106* 105* 102* 98*   Basic Metabolic Panel: Recent Labs  Lab 02/18/21 0257 02/18/21 1220 02/19/21 0616 02/19/21 2231 02/20/21 0331 02/21/21 0519  NA 138  --  137 138 138 141  K 5.8* 5.0 3.8 4.0 3.6 4.1  CL 110  --  111 108 110 112*  CO2 22  --  23 19* 24 22  GLUCOSE 77  --  99 105* 100* 76  BUN 38*  --  32* 32* 34* 37*  CREATININE 3.14*  --  3.11* 3.16* 3.09* 3.05*  CALCIUM  8.5*  --  8.0* 8.6* 8.2* 8.4*  MG 2.1  --   --   --   --   --    GFR: Estimated Creatinine Clearance: 15.9 mL/min (A) (by C-G formula based on SCr of 3.05 mg/dL (H)). Liver Function Tests: Recent Labs  Lab 02/19/21 2231 02/20/21 0331 02/21/21 0519  AST 34 26 28  ALT '13 15 13  '$ ALKPHOS 57 47 48  BILITOT 1.0 0.6 0.8  PROT 6.5 5.6* 5.6*  ALBUMIN 3.3* 3.0* 2.8*   No results for input(s): LIPASE, AMYLASE in the last 168 hours. No results for input(s): AMMONIA in the last 168 hours. Coagulation Profile: No results for input(s): INR, PROTIME in the last 168 hours. Cardiac Enzymes: No results for input(s): CKTOTAL, CKMB, CKMBINDEX, TROPONINI in the last 168 hours. BNP (last 3 results) No results for input(s): PROBNP in the last 8760 hours. HbA1C: No results  for input(s): HGBA1C in the last 72 hours. CBG: Recent Labs  Lab 02/19/21 0750  GLUCAP 104*   Lipid Profile: No results for input(s): CHOL, HDL, LDLCALC, TRIG, CHOLHDL, LDLDIRECT in the last 72 hours. Thyroid Function Tests: No results for input(s): TSH, T4TOTAL, FREET4, T3FREE, THYROIDAB in the last 72 hours. Anemia Panel: No results for input(s): VITAMINB12, FOLATE, FERRITIN, TIBC, IRON, RETICCTPCT in the last 72 hours. Urine analysis:    Component Value Date/Time   COLORURINE YELLOW (A) 02/16/2021 1246   APPEARANCEUR CLEAR (A) 02/16/2021 1246   APPEARANCEUR Clear 01/02/2021 1358   LABSPEC 1.013 02/16/2021 1246   PHURINE 6.0 02/16/2021 1246   GLUCOSEU NEGATIVE 02/16/2021 1246   HGBUR NEGATIVE 02/16/2021 1246   BILIRUBINUR NEGATIVE 02/16/2021 1246   BILIRUBINUR Negative 01/02/2021 1358   [place] 02/16/2021 1246   PROTEINUR NEGATIVE 02/16/2021 1246   NITRITE NEGATIVE 02/16/2021 1246   LEUKOCYTESUR TRACE (A) 02/16/2021 1246   Recent Results (from the past 240 hour(s))  Resp Panel by RT-PCR (Flu A&B, Covid) Nasopharyngeal Swab     Status: None   Collection Time: 02/16/21  2:56 AM   Specimen:  Nasopharyngeal Swab; Nasopharyngeal(NP) swabs in vial transport medium  Result Value Ref Range Status   SARS Coronavirus 2 by RT PCR NEGATIVE NEGATIVE Final    Comment: (NOTE) SARS-CoV-2 target nucleic acids are NOT DETECTED.  The SARS-CoV-2 RNA is generally detectable in upper respiratory specimens during the acute phase of infection. The lowest concentration of SARS-CoV-2 viral copies this assay can detect is 138 copies/mL. A negative result does not preclude SARS-Cov-2 infection and should not be used as the sole basis for treatment or other patient management decisions. A negative result may occur with  improper specimen collection/handling, submission of specimen other than nasopharyngeal swab, presence of viral mutation(s) within the areas targeted by this assay, and inadequate number of viral copies(<138 copies/mL). A negative result must be combined with clinical observations, patient history, and epidemiological information. The expected result is Negative.  Fact Sheet for Patients:  EntrepreneurPulse.com.au  Fact Sheet for Healthcare Providers:  IncredibleEmployment.be  This test is no t yet approved or cleared by the [place] FDA and  has been authorized for detection and/or diagnosis of SARS-CoV-2 by FDA under an Emergency Use Authorization (EUA). This EUA will remain  in effect (meaning this test can be used) for the duration of the COVID-19 declaration under Section 564(b)(1) of the Act, 21 U.S.C.section 360bbb-3(b)(1), unless the authorization is terminated  or revoked sooner.       Influenza A by PCR NEGATIVE NEGATIVE Final   Influenza B by PCR NEGATIVE NEGATIVE Final    Comment: (NOTE) The Xpert Xpress SARS-CoV-2/FLU/RSV plus assay is intended as an aid in the diagnosis of influenza from Nasopharyngeal swab specimens and should not be used as a sole basis for treatment. Nasal washings and aspirates are unacceptable for  Xpert Xpress SARS-CoV-2/FLU/RSV testing.  Fact Sheet for Patients: EntrepreneurPulse.com.au  Fact Sheet for Healthcare Providers: IncredibleEmployment.be  This test is not yet approved or cleared by the [place] FDA and has been authorized for detection and/or diagnosis of SARS-CoV-2 by FDA under an Emergency Use Authorization (EUA). This EUA will remain in effect (meaning this test can be used) for the duration of the COVID-19 declaration under Section 564(b)(1) of the Act, 21 U.S.C. section 360bbb-3(b)(1), unless the authorization is terminated or revoked.  Performed at [facility], [address] [place], [place] [address]   Urine Culture  Status: Abnormal   Collection Time: 02/16/21 12:46 PM   Specimen: Urine, Clean Catch  Result Value Ref Range Status   Specimen Description   Final    URINE, CLEAN CATCH Performed at [facility], [address] [place], [place] [address]    Special Requests   Final    NONE Performed at [facility], [place], [place] [address]    Culture 30,000 COLONIES/mL ENTEROCOCCUS FAECALIS (A)  Final   Report Status 02/18/2021 FINAL  Final   Organism ID, Bacteria ENTEROCOCCUS FAECALIS (A)  Final      Susceptibility   Enterococcus faecalis - MIC*    AMPICILLIN <=2 SENSITIVE Sensitive     NITROFURANTOIN <=16 SENSITIVE Sensitive     VANCOMYCIN <=0.5 SENSITIVE Sensitive     * 30,000 COLONIES/mL ENTEROCOCCUS FAECALIS  Resp Panel by RT-PCR (Flu A&B, Covid) Nasopharyngeal Swab     Status: Abnormal   Collection Time: 02/18/21 10:20 AM   Specimen: Nasopharyngeal Swab; Nasopharyngeal(NP) swabs in vial transport medium  Result Value Ref Range Status   SARS Coronavirus 2 by RT PCR POSITIVE (A) NEGATIVE Final    Comment: RESULT CALLED TO, READ BACK BY AND VERIFIED WITH: Matilde Sprang, RN [id number]. 02/18/2021 GAA (NOTE) SARS-CoV-2 target nucleic acids are DETECTED.  The  SARS-CoV-2 RNA is generally detectable in upper respiratory specimens during the acute phase of infection. Positive results are indicative of the presence of the identified virus, but do not rule out bacterial infection or co-infection with other pathogens not detected by the test. Clinical correlation with patient history and other diagnostic information is necessary to determine patient infection status. The expected result is Negative.  Fact Sheet for Patients: EntrepreneurPulse.com.au  Fact Sheet for Healthcare Providers: IncredibleEmployment.be  This test is not yet approved or cleared by the [place] FDA and  has been authorized for detection and/or diagnosis of SARS-CoV-2 by FDA under an Emergency Use Authorization (EUA).  This EUA will remain in effect (meaning this test can  be used) for the duration of  the COVID-19 declaration under Section 564(b)(1) of the Act, 21 U.S.C. section 360bbb-3(b)(1), unless the authorization is terminated or revoked sooner.     Influenza A by PCR NEGATIVE NEGATIVE Final   Influenza B by PCR NEGATIVE NEGATIVE Final    Comment: (NOTE) The Xpert Xpress SARS-CoV-2/FLU/RSV plus assay is intended as an aid in the diagnosis of influenza from Nasopharyngeal swab specimens and should not be used as a sole basis for treatment. Nasal washings and aspirates are unacceptable for Xpert Xpress SARS-CoV-2/FLU/RSV testing.  Fact Sheet for Patients: EntrepreneurPulse.com.au  Fact Sheet for Healthcare Providers: IncredibleEmployment.be  This test is not yet approved or cleared by the [place] FDA and has been authorized for detection and/or diagnosis of SARS-CoV-2 by FDA under an Emergency Use Authorization (EUA). This EUA will remain in effect (meaning this test can be used) for the duration of the COVID-19 declaration under Section 564(b)(1) of the Act, 21 U.S.C. section  360bbb-3(b)(1), unless the authorization is terminated or revoked.  Performed at [facility], [address] [place], [place] [address]       Radiology Studies: No results found.  Scheduled Meds:  amoxicillin  500 mg Oral Q12H   aspirin EC  81 mg Oral Daily   atorvastatin  80 mg Oral Daily   calcitRIOL  0.25 mcg Oral Daily   cholecalciferol  1,000 Units Oral Daily   citalopram  10 mg Oral Daily  clopidogrel  75 mg Oral Daily   famotidine  20 mg Oral Daily   finasteride  5 mg Oral Daily   furosemide  40 mg Oral Daily   latanoprost  1 drop Both Eyes QHS   metoprolol tartrate  25 mg Oral BID   pantoprazole  40 mg Oral Daily   tamsulosin  0.8 mg Oral Daily   traZODone  150 mg Oral QHS   Continuous Infusions:   LOS: 4 days   Time spent: 35 minutes.  Patrecia Pour, MD [facility] [email or web address] 02/23/2021, 1:02 PM

## 2021-02-23 NOTE — Plan of Care (Signed)
Patient continues to have rhonchi and expiratory wheezes. Has runny nose, slight non productive cough.   Problem: Education: Goal: Knowledge of General Education information will improve Description: Including pain rating scale, medication(s)/side effects and non-pharmacologic comfort measures Outcome: Progressing   Problem: Health Behavior/Discharge Planning: Goal: Ability to manage health-related needs will improve Outcome: Progressing   Problem: Clinical Measurements: Goal: Respiratory complications will improve Outcome: Not Progressing   Problem: Activity: Goal: Risk for activity intolerance will decrease Outcome: Progressing   Problem: Nutrition: Goal: Adequate nutrition will be maintained Outcome: Progressing   Problem: Pain Managment: Goal: General experience of comfort will improve Outcome: Progressing   Problem: Safety: Goal: Ability to remain free from injury will improve Outcome: Progressing

## 2021-02-23 NOTE — TOC Progression Note (Addendum)
Transition of Care [facility]) - Progression Note    Patient Details  Name: Max Ellis MRN: [id number] Date of Birth: [birth year]  Transition of Care [facility]) CM/SW [place], LCSW Phone Number: 02/23/2021, 11:37 AM  Clinical Narrative:   Patient COVID + on 7/19. According to chart review patient is disoriented x 2. RNCM spoke with daughter earlier this week who stated they would like a SNF work up if recommended. PT did recommend SNF. Left VM for daughter requesting a return call.  Spoke to RN who said patient understands the risks of going home but still refuses SNF. Asked RN and MD if patient is competent to make this decision. Per MD patient has become more clear than earlier in hospitalization, will need to see how he is doing in a few days to determine if he is competent to decide.   Patient would not be abel to go to SNF until 10 days after his positive COVID test.  TOC to follow.     2:40- Call from patient's son Max Ellis who stated they do want patient to go to rehab if patient will agree. He was agreeable to CSW starting SNF work up.     Expected Discharge Plan: [place] Barriers to Discharge: Continued Medical Work up  Expected Discharge Plan and Services Expected Discharge Plan: [place] arrangements for the past 2 months: Single Family Home                                       Social Determinants of Health (SDOH) Interventions    Readmission Risk Interventions Readmission Risk Prevention Plan 02/20/2021  Transportation Screening Complete  Palliative Care Screening Complete  Medication Review (RN Care Manager) Complete  Some recent data might be hidden

## 2021-02-24 DIAGNOSIS — I10 Essential (primary) hypertension: Secondary | ICD-10-CM | POA: Diagnosis not present

## 2021-02-24 DIAGNOSIS — W19XXXA Unspecified fall, initial encounter: Secondary | ICD-10-CM | POA: Diagnosis not present

## 2021-02-24 LAB — RENAL FUNCTION PANEL
Albumin: 2.6 g/dL — ABNORMAL LOW (ref 3.5–5.0)
Anion gap: 6 (ref 5–15)
BUN: 55 mg/dL — ABNORMAL HIGH (ref 8–23)
CO2: 19 mmol/L — ABNORMAL LOW (ref 22–32)
Calcium: 8.1 mg/dL — ABNORMAL LOW (ref 8.9–10.3)
Chloride: 111 mmol/L (ref 98–111)
Creatinine, Ser: 3.17 mg/dL — ABNORMAL HIGH (ref 0.61–1.24)
GFR, Estimated: 18 mL/min — ABNORMAL LOW (ref >60)
Glucose, Bld: 117 mg/dL — ABNORMAL HIGH (ref 70–99)
Phosphorus: 3.1 mg/dL (ref 2.5–4.6)
Potassium: 5 mmol/L (ref 3.5–5.1)
Sodium: 136 mmol/L (ref 135–145)

## 2021-02-24 LAB — CBC WITH DIFFERENTIAL/PLATELET
Abs Immature Granulocytes: 0.02 10*3/uL (ref 0.00–0.07)
Basophils Absolute: 0 10*3/uL (ref 0.0–0.1)
Basophils Relative: 0 %
Eosinophils Absolute: 0 10*3/uL (ref 0.0–0.5)
Eosinophils Relative: 0 %
HCT: 27.5 % — ABNORMAL LOW (ref 39.0–52.0)
Hemoglobin: 9.3 g/dL — ABNORMAL LOW (ref 13.0–17.0)
Immature Granulocytes: 1 %
Lymphocytes Relative: 15 %
Lymphs Abs: 0.5 10*3/uL — ABNORMAL LOW (ref 0.7–4.0)
MCH: 32.5 pg (ref 26.0–34.0)
MCHC: 33.8 g/dL (ref 30.0–36.0)
MCV: 96.2 fL (ref 80.0–100.0)
Monocytes Absolute: 0.1 10*3/uL (ref 0.1–1.0)
Monocytes Relative: 2 %
Neutro Abs: 2.8 10*3/uL (ref 1.7–7.7)
Neutrophils Relative %: 82 %
Platelets: 98 10*3/uL — ABNORMAL LOW (ref 150–400)
RBC: 2.86 MIL/uL — ABNORMAL LOW (ref 4.22–5.81)
RDW: 13.3 % (ref 11.5–15.5)
WBC: 3.3 10*3/uL — ABNORMAL LOW (ref 4.0–10.5)
nRBC: 0 % (ref 0.0–0.2)

## 2021-02-24 MED ORDER — SODIUM CHLORIDE 0.9 % IV SOLN: INTRAVENOUS | Status: DC

## 2021-02-24 NOTE — Progress Notes (Signed)
PROGRESS NOTE  Max Ellis  DOB: 1934-07-10  PCP: Maryland Pink, MD [id number]  DOA: 02/19/2021  LOS: 5 days  Hospital Day: 6   Chief Complaint  Patient presents with   Fall    Brief narrative: Max Ellis is a 87 y.o. male with PMH significant for COPD, HTN, PVD, CKD stage IV, recent NSTEMI and covid-19 infection.  During that hospitalization, patient received monoclonal antibody for covid-19 infection. Despite multiple recommendations for discharge to SNF, the patient and family declined and the patient was discharged home with maximal home health support. In the ED, he had a low grade fever without normal WBC. CT head and cervical spine were without acute changes.  Admitted to hospitalist service. PT recommended SNF.  Patient still refuses to accept the recommendation.  Subjective: Patient was seen and examined this morning.  Pleasant elderly pleasant male.  Propped up in bed.  Not in any distress.  Assessment/Plan: UTI -Continue amoxicillin x7 days (put stop date) per culture data (sensitive enterococcus).   BPH, urinary retention -Passed voiding trial this hospitalization. -Continue home medications.    AKI -Baseline creatinine seems to be at 2.78 in 2020.  Creatinine elevated to 3.17 today. Recent Labs    02/15/21 1828 02/16/21 0603 02/18/21 0257 02/19/21 0616 02/19/21 2231 02/20/21 0331 02/21/21 0519 02/24/21 0522  BUN 42* 39* 38* 32* 32* 34* 37* 55*  CREATININE 3.70* 3.22* 3.14* 3.11* 3.16* 3.09* 3.05* [id number]*   Acute metabolic encephalopathy -Neuroimaging on readmission was negative -Avoid ativan as much as possible. Pt did not have significant agitation/delirium during past hospitalization, so suspect this may be related to covid infection. Also may have contribution by concussion.  -Mental status seems to be fluctuating, currently he is oriented to place and person, not to time.   Covid-19 infection -Negative CXR. Possible sequelae instead are  weakness, encephalopathy. -s/p MAB 7/20. -Remdesivir not strictly contraindicated with severe renal dysfunction, though potential benefit (very limited, and not a mortality benefit in available studies to date) is not likely to justify risk at this time. 100% on room air, so no steroids. -CRP is elevated modestly, though no respiratory symptoms to date. -Remain on isolation x10 days.   Fall, generalized weakness -Pt returned from home with fall less than 24 hours after discharge. -PT and OT to continue. Suspect his weakness is attributable to covid infection and is persistent on chronic deconditioning/advanced age. Strongly recommend SNF at discharge given his immediate readmission and continued PT/OT recommendations.    CAD, recent NSTEMI -No anginal complaints at this time. -Continue DAPT, statin, beta blocker -Still will need cardiology follow up.   Stage IV CKD -SCr remains at baseline. -Avoid nephrotoxins, continue home medications. Avoiding IVF, pushing po.   GERD - PPI   Anemia of CKD -At presumed baseline. No ongoing bleeding despite diffuse bruising (which is not progressive since admission)   Thrombocytopenia -Low but stable. Recent Labs  Lab 02/18/21 0257 02/19/21 0616 02/19/21 2231 02/20/21 0331 02/21/21 0519 02/22/21 0525 02/23/21 0510 02/24/21 0522  PLT 142* 121* 124* 106* 105* 102* 98* 98*    Tobacco use -Nicotine patch   HTN -Blood pressure controlled on metoprolol.   COPD with acute exacerbation -Continue BDs by inhaler (not neb) - schedule combivent, prn albuterol. Start prednisone given expiratory wheezing.   Mobility: PT eval obtained, SNF recommended Code Status:   Code Status: DNR  Nutritional status: Body mass index is 17.97 kg/m.     Diet:  Diet Order  Diet Heart Room service appropriate? Yes; Fluid consistency: Thin  Diet effective now                  DVT prophylaxis: SCDs    Antimicrobials: Amoxicillin 500 mg  twice daily 7/25 Fluid: NS at 75 mill per hour Consultants: None Family Communication: None at bedside  Status is: Inpatient  Remains inpatient appropriate because: Continues to be weak, needs SNF placement but continues to refuse  Dispo: The patient is from: Home              Anticipated d/c is to: SNF              Patient currently is not medically stable to d/c.   Difficult to place patient No     Infusions:   sodium chloride 75 mL/hr at 02/24/21 [id number]    Scheduled Meds:  amoxicillin  500 mg Oral Q12H   aspirin EC  81 mg Oral Daily   atorvastatin  80 mg Oral Daily   calcitRIOL  0.25 mcg Oral Daily   cholecalciferol  1,000 Units Oral Daily   citalopram  10 mg Oral Daily   clopidogrel  75 mg Oral Daily   famotidine  20 mg Oral Daily   finasteride  5 mg Oral Daily   Ipratropium-Albuterol  1 puff Inhalation Q6H   latanoprost  1 drop Both Eyes QHS   metoprolol tartrate  25 mg Oral BID   pantoprazole  40 mg Oral Daily   predniSONE  40 mg Oral Q breakfast   tamsulosin  0.8 mg Oral Daily   traZODone  150 mg Oral QHS    Antimicrobials: Anti-infectives (From admission, onward)    Start     Dose/Rate Route Frequency Ordered Stop   02/20/21 1000  trimethoprim (TRIMPEX) tablet 50 mg  Status:  Discontinued        50 mg Oral Daily 02/19/21 2351 02/20/21 1655   02/20/21 0000  amoxicillin (AMOXIL) capsule 500 mg        500 mg Oral Every 12 hours 02/19/21 2351 02/24/21 2359   02/19/21 2300  ceFAZolin (ANCEF) IVPB 1 g/50 mL premix        1 g 100 mL/hr over 30 Minutes Intravenous  Once 02/19/21 2256 02/20/21 0008       PRN meds: acetaminophen **OR** acetaminophen, albuterol, ondansetron **OR** ondansetron (ZOFRAN) IV   Objective: Vitals:   02/24/21 0857 02/24/21 1548  BP: (!) 181/57 (!) 142/55  Pulse: (!) 54 (!) 52  Resp: 17 16  Temp: (!) 97.5 F (36.4 C) 97.8 F (36.6 C)  SpO2: 100% 100%    Intake/Output Summary (Last 24 hours) at 02/24/2021 1605 Last data filed  at 02/24/2021 0558 Gross per 24 hour  Intake --  Output 800 ml  Net -800 ml   Filed Weights   02/19/21 2056  Weight: 63.5 kg   Weight change:  Body mass index is 17.97 kg/m.   Physical Exam: General exam: Elderly Caucasian male.  Not in physical distress Skin: No rashes, lesions or ulcers. HEENT: Atraumatic, normocephalic, no obvious bleeding Lungs: Clear to auscultation bilaterally CVS: Regular rate and rhythm, no murmur GI/Abd soft, nontender, nondistended, bowel sound present CNS: Alert, awake, oriented to place and person Psychiatry: Depressed look Extremities: No pedal edema, no calf tenderness  Data Review: I have personally reviewed the laboratory data and studies available.  Recent Labs  Lab 02/19/21 2231 02/20/21 0331 02/21/21 0519 02/22/21 0525 02/23/21 0510 02/24/21 0522  WBC  6.0 4.9 4.9 4.5 4.5 3.3*  NEUTROABS 4.9  --  3.4 3.0 2.7 2.8  HGB 9.9* 8.5* 9.8* 9.1* 9.2* 9.3*  HCT 30.1* 25.8* 29.5* 27.2* 27.7* 27.5*  MCV 99.0 100.4* 99.0 98.9 96.5 96.2  PLT 124* 106* 105* 102* 98* 98*   Recent Labs  Lab 02/18/21 0257 02/18/21 1220 02/19/21 0616 02/19/21 2231 02/20/21 0331 02/21/21 0519 02/24/21 0522  NA 138  --  137 138 138 141 136  K 5.8*   < > 3.8 4.0 3.6 4.1 5.0  CL 110  --  111 108 110 112* 111  CO2 22  --  23 19* 24 22 19*  GLUCOSE 77  --  99 105* 100* 76 117*  BUN 38*  --  32* 32* 34* 37* 55*  CREATININE 3.14*  --  3.11* 3.16* 3.09* 3.05* 3.17*  CALCIUM 8.5*  --  8.0* 8.6* 8.2* 8.4* 8.1*  MG 2.1  --   --   --   --   --   --   PHOS  --   --   --   --   --   --  3.1   < > = values in this interval not displayed.    F/u labs ordered Unresulted Labs (From admission, onward)     Start     Ordered   02/21/21 0500  CBC with Differential/Platelet  Daily,   R     Question:  Specimen collection method  Answer:  Lab=Lab collect   02/20/21 1656            Signed, Terrilee Croak, MD [facility] 02/24/2021

## 2021-02-25 DIAGNOSIS — I10 Essential (primary) hypertension: Secondary | ICD-10-CM | POA: Diagnosis not present

## 2021-02-25 DIAGNOSIS — W19XXXA Unspecified fall, initial encounter: Secondary | ICD-10-CM | POA: Diagnosis not present

## 2021-02-25 DIAGNOSIS — F32A Depression, unspecified: Secondary | ICD-10-CM

## 2021-02-25 DIAGNOSIS — Z9181 History of falling: Secondary | ICD-10-CM

## 2021-02-25 LAB — CBC WITH DIFFERENTIAL/PLATELET
Abs Immature Granulocytes: 0.04 10*3/uL (ref 0.00–0.07)
Basophils Absolute: 0 10*3/uL (ref 0.0–0.1)
Basophils Relative: 0 %
Eosinophils Absolute: 0 10*3/uL (ref 0.0–0.5)
Eosinophils Relative: 0 %
HCT: 29.6 % — ABNORMAL LOW (ref 39.0–52.0)
Hemoglobin: 9.8 g/dL — ABNORMAL LOW (ref 13.0–17.0)
Immature Granulocytes: 1 %
Lymphocytes Relative: 12 %
Lymphs Abs: 0.7 10*3/uL (ref 0.7–4.0)
MCH: 32.5 pg (ref 26.0–34.0)
MCHC: 33.1 g/dL (ref 30.0–36.0)
MCV: 98 fL (ref 80.0–100.0)
Monocytes Absolute: 0.5 10*3/uL (ref 0.1–1.0)
Monocytes Relative: 7 %
Neutro Abs: 5.1 10*3/uL (ref 1.7–7.7)
Neutrophils Relative %: 80 %
Platelets: 105 10*3/uL — ABNORMAL LOW (ref 150–400)
RBC: 3.02 MIL/uL — ABNORMAL LOW (ref 4.22–5.81)
RDW: 13.4 % (ref 11.5–15.5)
WBC: 6.3 10*3/uL (ref 4.0–10.5)
nRBC: 0 % (ref 0.0–0.2)

## 2021-02-25 LAB — BASIC METABOLIC PANEL
Anion gap: 7 (ref 5–15)
BUN: 61 mg/dL — ABNORMAL HIGH (ref 8–23)
CO2: 20 mmol/L — ABNORMAL LOW (ref 22–32)
Calcium: 8.4 mg/dL — ABNORMAL LOW (ref 8.9–10.3)
Chloride: 111 mmol/L (ref 98–111)
Creatinine, Ser: 2.89 mg/dL — ABNORMAL HIGH (ref 0.61–1.24)
GFR, Estimated: 21 mL/min — ABNORMAL LOW (ref >60)
Glucose, Bld: 106 mg/dL — ABNORMAL HIGH (ref 70–99)
Potassium: 4.7 mmol/L (ref 3.5–5.1)
Sodium: 138 mmol/L (ref 135–145)

## 2021-02-25 NOTE — Consult Note (Signed)
[facility] Face-to-Face Psychiatry Consult   Reason for Consult: Consult for 87 year old man in the hospital after a fall and subsequent COVID-19.  Question about capacity to live alone. Referring Physician: Dahal Patient Identification: Max Ellis MRN:  [id number] Principal Diagnosis: Fall Diagnosis:  Principal Problem:   Fall Active Problems:   Essential hypertension   Tobacco use disorder   Chronic kidney disease, stage 4 (severe) (HCC)   NSTEMI (non-ST elevated myocardial infarction) ([place])   COVID-19 virus infection   Acute lower UTI   Total Time spent with patient: 1 hour  Subjective:   Max Ellis is a 87 y.o. male patient admitted with "I fell".  HPI: Patient seen chart reviewed.  87 year old man presented to the hospital after a fall.  Subsequently found to have COVID.  Treatment team is recommending skilled nursing facility at discharge.  Patient is stating a preference to go back to his home.  Patient's wife passed away recently like within the last month or so.  Previously the 2 of them had been living together and now he is alone at home in a house.  Patient reports that he has had no problems taking care of himself since she passed away.  He says his daughter and granddaughter take him to purchase food.  He admits to feeling sad much of the time and says he has had some thoughts at times of wishing he had died but denies any thoughts of killing himself.  He says he sleeps with sleeping medicine.  Claims to be eating well.  Denies any hallucinations.  Patient states that he would prefer to be discharged back to his home.  Apparently this is the home where he and his wife had lived together for a long time.  He did acknowledge that he knew that a recommendation had been made about a nursing home.  When asked to explain why he wanted to go back home instead of a nursing home all he could come up with was "it is my home".  He was not able to articulate any possible advantages to going to  a nursing home.  Patient was slow in his responses.  Disoriented to month and year.  He was able to remember 2 out of 3 words at 5 minutes.  Past Psychiatric History: No known past psychiatric history.  No history of depression.  Unclear whether dementia had even been mentioned in previous examinations.  No history of psychiatric hospitalization.  Risk to Self:   Risk to Others:   Prior Inpatient Therapy:   Prior Outpatient Therapy:    Past Medical History:  Past Medical History:  Diagnosis Date   COPD (chronic obstructive pulmonary disease) ([place])    Hypertension    Peripheral vascular disease ([place])    Renal disorder    Stage 4 chronic kidney disease ([place])    No past surgical history on file. Family History: No family history on file. Family Psychiatric  History: Denies knowing of any Social History:  Social History   Substance and Sexual Activity  Alcohol Use No     Social History   Substance and Sexual Activity  Drug Use No    Social History   Socioeconomic History   Marital status: Married    Spouse name: Not on file   Number of children: Not on file   Years of education: Not on file   Highest education level: Not on file  Occupational History   Not on file  Tobacco Use  Smoking status: Every Day    Types: Cigarettes   Smokeless tobacco: Former    Types: Chew  Substance and Sexual Activity   Alcohol use: No   Drug use: No   Sexual activity: Not on file  Other Topics Concern   Not on file  Social History Narrative   Not on file   Social Determinants of Health   Financial Resource Strain: Not on file  Food Insecurity: Not on file  Transportation Needs: Not on file  Physical Activity: Not on file  Stress: Not on file  Social Connections: Not on file   Additional Social History:    Allergies:  No Known Allergies  Labs:  Results for orders placed or performed during the hospital encounter of 02/19/21 (from the past 48 hour(s))  CBC with  Differential/Platelet     Status: Abnormal   Collection Time: 02/24/21  5:22 AM  Result Value Ref Range   WBC 3.3 (L) 4.0 - 10.5 K/uL   RBC 2.86 (L) 4.22 - 5.81 MIL/uL   Hemoglobin 9.3 (L) 13.0 - 17.0 g/dL   HCT 27.5 (L) 39.0 - 52.0 %   MCV 96.2 80.0 - 100.0 fL   MCH 32.5 26.0 - 34.0 pg   MCHC 33.8 30.0 - 36.0 g/dL   RDW 13.3 11.5 - 15.5 %   Platelets 98 (L) 150 - 400 K/uL    Comment: Immature Platelet Fraction may be clinically indicated, consider ordering this additional test [id number]    nRBC 0.0 0.0 - 0.2 %   Neutrophils Relative % 82 %   Neutro Abs 2.8 1.7 - 7.7 K/uL   Lymphocytes Relative 15 %   Lymphs Abs 0.5 (L) 0.7 - 4.0 K/uL   Monocytes Relative 2 %   Monocytes Absolute 0.1 0.1 - 1.0 K/uL   Eosinophils Relative 0 %   Eosinophils Absolute 0.0 0.0 - 0.5 K/uL   Basophils Relative 0 %   Basophils Absolute 0.0 0.0 - 0.1 K/uL   Immature Granulocytes 1 %   Abs Immature Granulocytes 0.02 0.00 - 0.07 K/uL    Comment: Performed at [facility], [address] [place], [place] [address]  Renal function panel     Status: Abnormal   Collection Time: 02/24/21  5:22 AM  Result Value Ref Range   Sodium 136 135 - 145 mmol/L   Potassium 5.0 3.5 - 5.1 mmol/L   Chloride 111 98 - 111 mmol/L   CO2 19 (L) 22 - 32 mmol/L   Glucose, Bld 117 (H) 70 - 99 mg/dL    Comment: Glucose reference range applies only to samples taken after fasting for at least 8 hours.   BUN 55 (H) 8 - 23 mg/dL   Creatinine, Ser 3.17 (H) 0.61 - 1.24 mg/dL   Calcium 8.1 (L) 8.9 - 10.3 mg/dL   Phosphorus 3.1 2.5 - 4.6 mg/dL   Albumin 2.6 (L) 3.5 - 5.0 g/dL   GFR, Estimated 18 (L) >60 mL/min    Comment: (NOTE) Calculated using the CKD-EPI Creatinine Equation (2021)    Anion gap 6 5 - 15    Comment: Performed at [facility], [address] [place], [place] [address]  CBC with Differential/Platelet     Status: Abnormal   Collection Time: 02/25/21  5:12 AM  Result Value Ref Range   WBC 6.3  4.0 - 10.5 K/uL   RBC 3.02 (L) 4.22 - 5.81 MIL/uL   Hemoglobin 9.8 (L) 13.0 - 17.0 g/dL   HCT 29.6 (  L) 39.0 - 52.0 %   MCV 98.0 80.0 - 100.0 fL   MCH 32.5 26.0 - 34.0 pg   MCHC 33.1 30.0 - 36.0 g/dL   RDW 13.4 11.5 - 15.5 %   Platelets 105 (L) 150 - 400 K/uL    Comment: Immature Platelet Fraction may be clinically indicated, consider ordering this additional test [id number]    nRBC 0.0 0.0 - 0.2 %   Neutrophils Relative % 80 %   Neutro Abs 5.1 1.7 - 7.7 K/uL   Lymphocytes Relative 12 %   Lymphs Abs 0.7 0.7 - 4.0 K/uL   Monocytes Relative 7 %   Monocytes Absolute 0.5 0.1 - 1.0 K/uL   Eosinophils Relative 0 %   Eosinophils Absolute 0.0 0.0 - 0.5 K/uL   Basophils Relative 0 %   Basophils Absolute 0.0 0.0 - 0.1 K/uL   Immature Granulocytes 1 %   Abs Immature Granulocytes 0.04 0.00 - 0.07 K/uL    Comment: Performed at [facility], [address] [place], [place] [id number]  Basic metabolic panel     Status: Abnormal   Collection Time: 02/25/21  5:12 AM  Result Value Ref Range   Sodium 138 135 - 145 mmol/L   Potassium 4.7 3.5 - 5.1 mmol/L   Chloride 111 98 - 111 mmol/L   CO2 20 (L) 22 - 32 mmol/L   Glucose, Bld 106 (H) 70 - 99 mg/dL    Comment: Glucose reference range applies only to samples taken after fasting for at least 8 hours.   BUN 61 (H) 8 - 23 mg/dL   Creatinine, Ser 2.89 (H) 0.61 - 1.24 mg/dL   Calcium 8.4 (L) 8.9 - 10.3 mg/dL   GFR, Estimated 21 (L) >60 mL/min    Comment: (NOTE) Calculated using the CKD-EPI Creatinine Equation (2021)    Anion gap 7 5 - 15    Comment: Performed at [facility], [address] [place], [place] [address]    Current Facility-Administered Medications  Medication Dose Route Frequency Provider Last Rate Last Admin   0.9 %  sodium chloride infusion   Intravenous Continuous Dahal, Marlowe Aschoff, MD 75 mL/hr at 02/25/21 1644 Infusion Verify at 02/25/21 1644   acetaminophen (TYLENOL) tablet 650 mg  650 mg Oral Q6H PRN Elwyn Reach, MD   650 mg at 02/22/21 0544   Or   acetaminophen (TYLENOL) suppository 650 mg  650 mg Rectal Q6H PRN Elwyn Reach, MD       albuterol (VENTOLIN HFA) 108 (90 Base) MCG/ACT inhaler 2 puff  2 puff Inhalation Q4H PRN Patrecia Pour, MD       aspirin EC tablet 81 mg  81 mg Oral Daily Gala Romney L, MD   81 mg at 02/25/21 [id number]   atorvastatin (LIPITOR) tablet 80 mg  80 mg Oral Daily Elwyn Reach, MD   80 mg at 02/25/21 [id number]   calcitRIOL (ROCALTROL) capsule 0.25 mcg  0.25 mcg Oral Daily Gala Romney L, MD   0.25 mcg at 02/25/21 [id number]   cholecalciferol (VITAMIN D3) tablet 1,000 Units  1,000 Units Oral Daily Elwyn Reach, MD   1,000 Units at 02/25/21 [id number]   citalopram (CELEXA) tablet 10 mg  10 mg Oral Daily Elwyn Reach, MD   10 mg at 02/25/21 [id number]   clopidogrel (PLAVIX) tablet 75 mg  75 mg Oral Daily Elwyn Reach, MD   75 mg at 02/25/21 0833   famotidine (PEPCID) tablet 20 mg  20 mg Oral Daily Dallie Piles, RPH   20 mg at 02/25/21 [id number]   finasteride (PROSCAR) tablet 5 mg  5 mg Oral Daily Elwyn Reach, MD   5 mg at 02/25/21 [id number]   Ipratropium-Albuterol (COMBIVENT) respimat 1 puff  1 puff Inhalation Q6H Vance Gather B, MD   1 puff at 02/25/21 0843   latanoprost (XALATAN) 0.005 % ophthalmic solution 1 drop  1 drop Both Eyes QHS Gala Romney L, MD   1 drop at 02/24/21 2234   metoprolol tartrate (LOPRESSOR) tablet 25 mg  25 mg Oral BID Gala Romney L, MD   25 mg at 02/25/21 0832   ondansetron (ZOFRAN) tablet 4 mg  4 mg Oral Q6H PRN Elwyn Reach, MD       Or   ondansetron (ZOFRAN) injection 4 mg  4 mg Intravenous Q6H PRN Elwyn Reach, MD       pantoprazole (PROTONIX) EC tablet 40 mg  40 mg Oral Daily Gala Romney L, MD   40 mg at 02/25/21 0831   predniSONE (DELTASONE) tablet 40 mg  40 mg Oral Q breakfast Patrecia Pour, MD   40 mg at 02/25/21 [id number]   tamsulosin (FLOMAX) capsule 0.8 mg  0.8 mg Oral Daily Gala Romney L, MD   0.8 mg at 02/25/21  [id number]   traZODone (DESYREL) tablet 150 mg  150 mg Oral QHS Elwyn Reach, MD   150 mg at 02/24/21 2233    Musculoskeletal: Strength & Muscle Tone: decreased Gait & Station: unsteady Patient leans: N/A            Psychiatric Specialty Exam:  Presentation  General Appearance:  No data recorded Eye Contact: No data recorded Speech: No data recorded Speech Volume: No data recorded Handedness: No data recorded  Mood and Affect  Mood: No data recorded Affect: No data recorded  Thought Process  Thought Processes: No data recorded Descriptions of Associations:No data recorded Orientation:No data recorded Thought Content:No data recorded History of Schizophrenia/Schizoaffective disorder:No data recorded Duration of Psychotic Symptoms:No data recorded Hallucinations:No data recorded Ideas of Reference:No data recorded Suicidal Thoughts:No data recorded Homicidal Thoughts:No data recorded  Sensorium  Memory: No data recorded Judgment: No data recorded Insight: No data recorded  Executive Functions  Concentration: No data recorded Attention Span: No data recorded Recall: No data recorded Fund of Knowledge: No data recorded Language: No data recorded  Psychomotor Activity  Psychomotor Activity: No data recorded  Assets  Assets: No data recorded  Sleep  Sleep: No data recorded  Physical Exam: Physical Exam Vitals and nursing note reviewed.  Constitutional:      Appearance: Normal appearance. He is ill-appearing.  HENT:     Head: Normocephalic and atraumatic.     Mouth/Throat:     Pharynx: Oropharynx is clear.  Eyes:     Pupils: Pupils are equal, round, and reactive to light.  Cardiovascular:     Rate and Rhythm: Normal rate and regular rhythm.  Pulmonary:     Effort: Pulmonary effort is normal.     Breath sounds: Normal breath sounds.  Abdominal:     General: Abdomen is flat.     Palpations: Abdomen is soft.  Musculoskeletal:         General: Normal range of motion.  Skin:    General: Skin is warm and dry.  Neurological:     General: No focal deficit present.     Mental Status: He is alert. Mental status is at baseline.  Psychiatric:        Attention and Perception: Attention normal.        Mood and Affect: Mood normal. Affect is blunt.        Speech: Speech is delayed.        Behavior: Behavior is slowed.        Thought Content: Thought content normal. Thought content is not paranoid. Thought content does not include homicidal or suicidal ideation.        Cognition and Memory: Cognition is impaired.   Review of Systems  Constitutional: Negative.   HENT: Negative.    Eyes: Negative.   Respiratory: Negative.    Cardiovascular: Negative.   Gastrointestinal: Negative.   Musculoskeletal: Negative.   Skin: Negative.   Neurological: Negative.   Psychiatric/Behavioral:  Positive for depression. Negative for hallucinations, memory loss, substance abuse and suicidal ideas. The patient is not nervous/anxious and does not have insomnia.   Blood pressure (!) 170/59, pulse (!) 52, temperature 98.6 F (37 C), resp. rate 18, height '6\' 2"'$  (1.88 m), weight 63.5 kg, SpO2 100 %. Body mass index is 17.97 kg/m.  Treatment Plan Summary: Plan the question of "capacity" regarding decisions about where to live after discharge is a difficult 1.  Without having seen the patient's functioning prior to hospitalization and without having seen him function at home or know for certain what resources or assistance he has it is very hard for me to estimate what the dangers are of living independently.  On the face of it it would seem clear that living in a nursing home would be medically and physically safer.  On the other hand I think it is also obvious that the patient's emotional quality of life is likely to suffer if he is placed into a nursing home.  In this case the patient was not able to describe to me even 1 possible advantage to  going to a nursing home.  Normally when judging capacity we want to be sure that the person can express the pros and cons of the different options.  In this case he was not able to do that.  On the other hand this may be just because to him it is so obvious that he would prefer to go home.  Strictly speaking I think that he is not currently showing full capacity to make the informed choice between the 2 options.  On the other hand, we usually assume that a decision of where to live is such a natural and basic Freedom that it may be expected that a person would have a hard time really explaining it.  If the family ultimately does decide that he should go to a nursing facility I strongly recommend that they talk with the patient and take his preferences into account as much as possible before making a final decision.  As far as depression, he is expressing some symptoms that could be consistent with depression on the other hand having his spouse recently die makes it just is likely that this is normal grief.  I see that he has already been started on citalopram I would not add any extra medicine to that.  Disposition:  See note above.  Patient is currently not expressing the ability to clearly define the rationale of his choice or display evidence that he understands the pros and cons of the different options.  Alethia Berthold, MD 02/25/2021 5:13 PM

## 2021-02-25 NOTE — TOC Progression Note (Signed)
Transition of Care [facility]) - Progression Note    Patient Details  Name: Max Ellis MRN: [id number] Date of Birth: Apr 05, 1934  Transition of Care [facility]) CM/SW [place], RN Phone Number: 02/25/2021, 9:12 AM  Clinical Narrative: Called patient to discuss Rehabilitation discharge. Patients states he told the Doctor he would rather go home and feels he will do so much better.     Expected Discharge Plan: [place] Barriers to Discharge: Continued Medical Work up  Expected Discharge Plan and Services Expected Discharge Plan: [place] arrangements for the past 2 months: Single Family Home                                       Social Determinants of Health (SDOH) Interventions    Readmission Risk Interventions Readmission Risk Prevention Plan 02/20/2021  Transportation Screening Complete  Palliative Care Screening Complete  Medication Review (RN Care Manager) Complete  Some recent data might be hidden

## 2021-02-25 NOTE — Progress Notes (Signed)
Physical Therapy Treatment Patient Details Name: Max Ellis MRN: [id number] DOB: Nov 09, 1933 Today's Date: 02/25/2021    History of Present Illness Max Ellis is an 87yo male with a history of COPD, HTN, PVD, CKD stage IV, recent NSTEMI and covid-19 infection. He received medical treatment for NSTEMI with improvement in chest pain and troponin elevation, cleared for discharge by cardiology. Also received monoclonal antibody for covid-19 infection on 7/20. Despite multiple recommendations for discharge to SNF, the patient and family declined and the patient was discharged home with maximal home health support only to fall when getting out of bed at home the night of discharge prompting return to the hospital 7/20. He had a low grade fever without normal WBC. CT head and cervical spine were without acute changes.    PT Comments    Pt resting in bed upon PT arrival; agreeable to PT session. SBA with bed mobility; CGA with transfers; and CGA to min assist with ambulation 160 feet with RW in room.  Pt requiring vc's to stay within RW with turns; pt intermittently running into objects in room with RW requiring CGA to min assist for balance.  HR 50-54 bpm and O2 sats WFL on room air during sessions activities (nurse notified of pt's HR).  Will continue to focus on strengthening and progressive functional mobility during hospitalization.   Follow Up Recommendations  SNF;Supervision/Assistance - 24 hour     Equipment Recommendations  Rolling walker with 5" wheels;3in1 (PT)    Recommendations for Other Services       Precautions / Restrictions Precautions Precautions: Fall Restrictions Weight Bearing Restrictions: No    Mobility  Bed Mobility Overal bed mobility: Needs Assistance Bed Mobility: Supine to Sit;Sit to Supine     Supine to sit: Supervision;HOB elevated Sit to supine: Supervision;HOB elevated   General bed mobility comments: SBA for safety    Transfers Overall transfer  level: Needs assistance Equipment used: Rolling walker (2 wheeled) Transfers: Sit to/from Stand Sit to Stand: Min guard         General transfer comment: mild increased effort to stand from bed  Ambulation/Gait Ambulation/Gait assistance: Min guard;Min assist Gait Distance (Feet): 160 Feet Assistive device: Rolling walker (2 wheeled)   Gait velocity: mildly decreased   General Gait Details: partial step through gait pattern; vc's to stay within RW with turns; pt intermittently running into objects in room with RW requiring CGA to min assist for balance   Stairs             Wheelchair Mobility    Modified Rankin (Stroke Patients Only)       Balance Overall balance assessment: Needs assistance Sitting-balance support: No upper extremity supported;Feet supported Sitting balance-Leahy Scale: Good Sitting balance - Comments: steady sitting reaching within BOS   Standing balance support: Single extremity supported Standing balance-Leahy Scale: Fair Standing balance comment: pt steady with at least single UE support                            Cognition Arousal/Alertness: Awake/alert Behavior During Therapy: WFL for tasks assessed/performed Overall Cognitive Status: No family/caregiver present to determine baseline cognitive functioning                                 General Comments: Oriented to person and place.      Exercises      General Comments  Nursing cleared pt for participation in physical therapy.  Pt agreeable to PT session.      Pertinent Vitals/Pain Pain Assessment: No/denies pain BP 168/52 at rest beginning of session.    Home Living                      Prior Function            PT Goals (current goals can now be found in the care plan section) Acute Rehab PT Goals Patient Stated Goal: go home PT Goal Formulation: With patient Time For Goal Achievement: 03/08/21 Potential to Achieve Goals:  Fair Progress towards PT goals: Progressing toward goals    Frequency    Min 2X/week      PT Plan Current plan remains appropriate    Co-evaluation              AM-PAC PT "6 Clicks" Mobility   Outcome Measure  Help needed turning from your back to your side while in a flat bed without using bedrails?: None Help needed moving from lying on your back to sitting on the side of a flat bed without using bedrails?: A Little Help needed moving to and from a bed to a chair (including a wheelchair)?: A Little Help needed standing up from a chair using your arms (e.g., wheelchair or bedside chair)?: A Little Help needed to walk in hospital room?: A Little Help needed climbing 3-5 steps with a railing? : A Little 6 Click Score: 19    End of Session Equipment Utilized During Treatment: Gait belt Activity Tolerance: Patient tolerated treatment well Patient left: in bed;with call bell/phone within reach;with bed alarm set;Other (comment) (fall mat in place) Nurse Communication: Mobility status;Precautions;Other (comment) (pt's HR 50-54 bpm; IV beeping) PT Visit Diagnosis: Muscle weakness (generalized) (M62.81);Difficulty in walking, not elsewhere classified (R26.2);Unsteadiness on feet (R26.81)     Time: [id number] PT Time Calculation (min) (ACUTE ONLY): 23 min  Charges:  $Gait Training: 8-22 mins $Therapeutic Activity: 8-22 mins                     Leitha Bleak, PT 02/25/21, 3:20 PM

## 2021-02-25 NOTE — Progress Notes (Signed)
Tylenol given for joint pain.

## 2021-02-25 NOTE — Progress Notes (Signed)
PROGRESS NOTE  Max Ellis  DOB: 03-28-1934  PCP: Maryland Pink, MD [id number]  DOA: 02/19/2021  LOS: 6 days  Hospital Day: 7   Chief Complaint  Patient presents with   Fall    Brief narrative: Max Ellis is a 87 y.o. male with PMH significant for COPD, HTN, PVD, CKD stage IV, recent NSTEMI and covid-19 infection.  During that hospitalization, patient received monoclonal antibody for covid-19 infection. Despite multiple recommendations for discharge to SNF, the patient and family declined and the patient was discharged home with maximal home health support. In the ED, he had a low grade fever without normal WBC. CT head and cervical spine were without acute changes.  Admitted to hospitalist service. PT recommended SNF.  Patient still refuses to accept the recommendation.  Subjective: Patient was seen and examined this morning.  Lying down in bed.  Not in distress.  Not restless or agitated.  Oriented to place but not to time or person. Still continues to refuse to go to a SNF.  Assessment/Plan: UTI -Continue amoxicillin x7 days (put stop date) per culture data (sensitive enterococcus).   BPH, urinary retention -Passed voiding trial this hospitalization. -Continue home medications.    AKI -Baseline creatinine seems to be at 2.78 in 2020.  Creatinine was elevated to 3.27 on 7/25.  With IV hydration, it is improving.  We will continue IV fluid for another 24 hours. Recent Labs    02/15/21 1828 02/16/21 0603 02/18/21 0257 02/19/21 [id number] 02/19/21 2231 02/20/21 0331 02/21/21 0519 02/24/21 0522 02/25/21 0512  BUN 42* 39* 38* 32* 32* 34* 37* 55* 61*  CREATININE 3.70* 3.22* 3.14* 3.11* 3.16* 3.09* 3.05* 3.17* 2.89*    Acute metabolic encephalopathy -Neuroimaging on readmission was negative -Avoid ativan as much as possible. Pt did not have significant agitation/delirium during past hospitalization, so suspect this may be related to covid infection. Also may have  contribution by concussion.  -Mental status seems to be fluctuating, currently he is oriented to place but not to person or time.   Covid-19 infection -Negative CXR. Possible sequelae instead are weakness, encephalopathy. -s/p MAB 7/20. -Remdesivir not strictly contraindicated with severe renal dysfunction, though potential benefit (very limited, and not a mortality benefit in available studies to date) is not likely to justify risk at this time. 100% on room air, so no steroids. -CRP is elevated modestly, though no respiratory symptoms to date. -Remain on isolation x10 days.   Fall, generalized weakness -Pt returned from home with fall less than 24 hours after discharge. -PT and OT to continue. Suspect his weakness is attributable to covid infection and is persistent on chronic deconditioning/advanced age. Strongly recommend SNF at discharge given his immediate readmission and continued PT/OT recommendations.  -Patient does not seem to be making appropriate decisions for him.  He is also disoriented and depressed after his wife passed last month.  Will obtain psych consult.   CAD, recent NSTEMI -No anginal complaints at this time. -Continue DAPT, statin, beta blocker -Still will need cardiology follow up.   Stage IV CKD -Serum creatinine remains at baseline. -Avoid nephrotoxins, continue home medications. Avoiding IVF, pushing po.   GERD - PPI   Anemia of CKD -At presumed baseline. No ongoing bleeding despite diffuse bruising (which is not progressive since admission)   Thrombocytopenia -Low but stable. Recent Labs  Lab 02/19/21 0616 02/19/21 2231 02/20/21 0331 02/21/21 0519 02/22/21 0525 02/23/21 0510 02/24/21 0522 02/25/21 0512  PLT 121* 124* 106* 105* 102* 98* 98*  105*     Tobacco use -Nicotine patch   HTN -Blood pressure controlled on metoprolol.   COPD with acute exacerbation -Continue BDs by inhaler (not neb), scheduled combivent, prn albuterol.  -Start  prednisone given expiratory wheezing.   Mobility: PT eval obtained, SNF recommended Code Status:   Code Status: DNR  Nutritional status: Body mass index is 17.97 kg/m.     Diet:  Diet Order             Diet Heart Room service appropriate? Yes; Fluid consistency: Thin  Diet effective now                  DVT prophylaxis: SCDs    Antimicrobials: Amoxicillin 500 mg twice daily 7/25 Fluid: NS at 75 mill per hour Consultants: None Family Communication: None at bedside  Status is: Inpatient  Remains inpatient appropriate because: Continues to be weak, needs SNF placement but continues to refuse  Dispo: The patient is from: Home              Anticipated d/c is to: SNF              Patient currently is not medically stable to d/c.   Difficult to place patient No     Infusions:   sodium chloride 75 mL/hr at 02/24/21 2041    Scheduled Meds:  aspirin EC  81 mg Oral Daily   atorvastatin  80 mg Oral Daily   calcitRIOL  0.25 mcg Oral Daily   cholecalciferol  1,000 Units Oral Daily   citalopram  10 mg Oral Daily   clopidogrel  75 mg Oral Daily   famotidine  20 mg Oral Daily   finasteride  5 mg Oral Daily   Ipratropium-Albuterol  1 puff Inhalation Q6H   latanoprost  1 drop Both Eyes QHS   metoprolol tartrate  25 mg Oral BID   pantoprazole  40 mg Oral Daily   predniSONE  40 mg Oral Q breakfast   tamsulosin  0.8 mg Oral Daily   traZODone  150 mg Oral QHS    Antimicrobials: Anti-infectives (From admission, onward)    Start     Dose/Rate Route Frequency Ordered Stop   02/20/21 1000  trimethoprim (TRIMPEX) tablet 50 mg  Status:  Discontinued        50 mg Oral Daily 02/19/21 2351 02/20/21 1655   02/20/21 0000  amoxicillin (AMOXIL) capsule 500 mg        500 mg Oral Every 12 hours 02/19/21 2351 02/24/21 2359   02/19/21 2300  ceFAZolin (ANCEF) IVPB 1 g/50 mL premix        1 g 100 mL/hr over 30 Minutes Intravenous  Once 02/19/21 2256 02/20/21 0008       PRN  meds: acetaminophen **OR** acetaminophen, albuterol, ondansetron **OR** ondansetron (ZOFRAN) IV   Objective: Vitals:   02/25/21 0802 02/25/21 0837  BP: (!) 187/56   Pulse: (!) 49 (!) 54  Resp: 18   Temp: 98.2 F (36.8 C)   SpO2: 100%     Intake/Output Summary (Last 24 hours) at 02/25/2021 1116 Last data filed at 02/25/2021 0715 Gross per 24 hour  Intake --  Output 300 ml  Net -300 ml    Filed Weights   02/19/21 2056  Weight: 63.5 kg   Weight change:  Body mass index is 17.97 kg/m.   Physical Exam: General exam: Elderly Caucasian male.  Not in physical distress Skin: No rashes, lesions or ulcers. HEENT: Atraumatic, normocephalic, no  obvious bleeding Lungs: Clear to auscultation bilaterally CVS: Regular rate and rhythm, no murmur GI/Abd soft, nontender, nondistended, bowel sound present CNS: Alert, awake, oriented to place only, not to person or time. Psychiatry: Depressed look Extremities: No pedal edema, no calf tenderness  Data Review: I have personally reviewed the laboratory data and studies available.  Recent Labs  Lab 02/21/21 0519 02/22/21 0525 02/23/21 0510 02/24/21 0522 02/25/21 0512  WBC 4.9 4.5 4.5 3.3* 6.3  NEUTROABS 3.4 3.0 2.7 2.8 5.1  HGB 9.8* 9.1* 9.2* 9.3* 9.8*  HCT 29.5* 27.2* 27.7* 27.5* 29.6*  MCV 99.0 98.9 96.5 96.2 98.0  PLT 105* 102* 98* 98* 105*    Recent Labs  Lab 02/19/21 2231 02/20/21 0331 02/21/21 0519 02/24/21 0522 02/25/21 0512  NA 138 138 141 136 138  K 4.0 3.6 4.1 5.0 4.7  CL 108 110 112* 111 111  CO2 19* 24 22 19* 20*  GLUCOSE 105* 100* 76 117* 106*  BUN 32* 34* 37* 55* 61*  CREATININE 3.16* 3.09* 3.05* 3.17* 2.89*  CALCIUM 8.6* 8.2* 8.4* 8.1* 8.4*  PHOS  --   --   --  3.1  --      F/u labs ordered Unresulted Labs (From admission, onward)     Start     Ordered   02/25/21 [id number]  Basic metabolic panel  Daily,   R     Question:  Specimen collection method  Answer:  Lab=Lab collect   02/24/21 1616             Signed, Terrilee Croak, MD [facility] 02/25/2021

## 2021-02-26 DIAGNOSIS — W19XXXA Unspecified fall, initial encounter: Secondary | ICD-10-CM | POA: Diagnosis not present

## 2021-02-26 DIAGNOSIS — I10 Essential (primary) hypertension: Secondary | ICD-10-CM | POA: Diagnosis not present

## 2021-02-26 LAB — BASIC METABOLIC PANEL
Anion gap: 9 (ref 5–15)
BUN: 59 mg/dL — ABNORMAL HIGH (ref 8–23)
CO2: 20 mmol/L — ABNORMAL LOW (ref 22–32)
Calcium: 8.6 mg/dL — ABNORMAL LOW (ref 8.9–10.3)
Chloride: 110 mmol/L (ref 98–111)
Creatinine, Ser: 2.77 mg/dL — ABNORMAL HIGH (ref 0.61–1.24)
GFR, Estimated: 22 mL/min — ABNORMAL LOW (ref >60)
Glucose, Bld: 96 mg/dL (ref 70–99)
Potassium: 4.9 mmol/L (ref 3.5–5.1)
Sodium: 139 mmol/L (ref 135–145)

## 2021-02-26 NOTE — TOC Progression Note (Addendum)
Transition of Care [facility]) - Progression Note    Patient Details  Name: Max Ellis MRN: [id number] Date of Birth: 1934/07/13  Transition [facility]) CM/SW [place], RN Phone Number: 02/26/2021, 2:53 PM  Clinical Narrative: Several failed attempts to speak with patient by calling into the room, phone call failed message appears. Need to discuss SNF preference with patient currently there are three, [facility], [facility] and [facility]. Request via secure chat to nurse for patient to call me to discuss placement preference. Called Sister Max Ellis and Rehab. Preference, she agrees with Rehab however would rather brother Max Ellis [phone] make that decision. Attempted to call Max Ellis no answer left voice message to return call. [employer]. Submitted by fax with no SNF preference. TOC to continue to track for placement needs.   Max Ellis returned call says he will discuss placement with his son, Max Ellis given the three facility choices.  3:15pm. Max Ellis son called request the accepted SNF however when I said [facility], he immediately said yes. Max Ellis says he know of a childhood friend that lives there. Max Ellis appreciative of the phone call, I did inform him that I will start the process and keep them posted.    Expected Discharge Plan: [place] Barriers to Discharge: Continued Medical Work up  Expected Discharge Plan and Services Expected Discharge Plan: [place] arrangements for the past 2 months: Single Family Home                                       Social Determinants of Health (SDOH) Interventions    Readmission Risk Interventions Readmission Risk Prevention Plan 02/20/2021  Transportation Screening Complete  Palliative Care Screening Complete  Medication Review (RN Care Manager) Complete  Some recent data might be hidden

## 2021-02-26 NOTE — Progress Notes (Signed)
PROGRESS NOTE  Max Ellis  DOB: March 21, 1934  PCP: Maryland Pink, MD [id number]  DOA: 02/19/2021  LOS: 7 days  Hospital Day: 8   Chief Complaint  Patient presents with   Fall    Brief narrative: Max Ellis is a 87 y.o. male with PMH significant for COPD, HTN, PVD, CKD stage IV, recent NSTEMI and covid-19 infection.  During that hospitalization, patient received monoclonal antibody for covid-19 infection. Despite multiple recommendations for discharge to SNF, the patient and family declined and the patient was discharged home with maximal home health support. In the ED, he had a low grade fever without normal WBC. CT head and cervical spine were without acute changes.  Admitted to hospitalist service. PT recommended SNF.  Patient still refuses to accept the recommendation.  Subjective: Patient was seen and examined this morning.  Slumped up in his bed.  Looks very weak.  Knows he is in the hospital.  Disoriented otherwise.    Assessment/Plan: UTI -Continue amoxicillin x7 days (put stop date) per culture data (sensitive enterococcus).   BPH, urinary retention -Passed voiding trial this hospitalization. -Continue home medications.    AKI on CKD stage IV -Baseline creatinine was 2.78 in 2020.  Creatinine was elevated to 3.27 on 7/25.  With IV hydration, creatinine improved to baseline at 2.77 today.  Okay to stop fluids today. Recent Labs    02/15/21 1828 02/16/21 0603 02/18/21 0257 02/19/21 [id number] 02/19/21 2231 02/20/21 0331 02/21/21 0519 02/24/21 0522 02/25/21 0512 02/26/21 0704  BUN 42* 39* 38* 32* 32* 34* 37* 55* 61* 59*  CREATININE 3.70* 3.22* 3.14* 3.11* 3.16* 3.09* 3.05* 3.17* 2.89* [id number]*   Acute metabolic encephalopathy -Neuroimaging on readmission was negative -Avoid ativan as much as possible. Pt did not have significant agitation/delirium during past hospitalization, so suspect this may be related to covid infection. Also may have contribution by  concussion.  -Mental status seems to be fluctuating, currently he is oriented to place only.     Covid-19 infection -Negative CXR. Possible sequelae instead are weakness, encephalopathy. -s/p MAB 7/20. -Remdesivir not strictly contraindicated with severe renal dysfunction, though potential benefit (very limited, and not a mortality benefit in available studies to date) is not likely to justify risk at this time. 100% on room air, so no steroids. -CRP is elevated modestly, though no respiratory symptoms to date. -Remain on isolation x10 days.   Fall, generalized weakness -Pt returned from home with fall less than 24 hours after discharge. -PT and OT to continue. Suspect his weakness is attributable to covid infection and is persistent on chronic deconditioning/advanced age. Strongly recommend SNF at discharge given his immediate readmission and continued PT/OT recommendations.  -Patient does not seem to be making appropriate decisions for him.  Psychiatry consultation was obtained.  Patient does not seem to have capacity to make safe decision for him.   CAD, recent NSTEMI -No anginal complaints at this time. -Continue DAPT, statin, beta blocker -Still will need cardiology follow up.   GERD - PPI   Anemia of CKD -At presumed baseline. No ongoing bleeding despite diffuse bruising (which is not progressive since admission)   Thrombocytopenia -Low but stable. Recent Labs  Lab 02/19/21 2231 02/20/21 0331 02/21/21 0519 02/22/21 0525 02/23/21 0510 02/24/21 0522 02/25/21 0512  PLT 124* 106* 105* 102* 98* 98* 105*    Tobacco use -Nicotine patch   HTN -Blood pressure controlled on metoprolol.   COPD with acute exacerbation -Continue BDs by inhaler (not neb), scheduled combivent, prn  albuterol.  -Start prednisone given expiratory wheezing.   Mobility: PT eval obtained, SNF recommended Code Status:   Code Status: DNR  Nutritional status: Body mass index is 17.97 kg/m.      Diet:  Diet Order             Diet Heart Room service appropriate? Yes; Fluid consistency: Thin  Diet effective now                  DVT prophylaxis: SCDs    Antimicrobials: Completed Fluid: Start IV fluid today Consultants: None Family Communication: None at bedside  Status is: Inpatient  Remains inpatient appropriate because: Continues to be weak, needs SNF placement  Dispo: The patient is from: Home              Anticipated d/c is to: SNF when bed available              Patient currently is medically stable to d/c.   Difficult to place patient No     Infusions:     Scheduled Meds:  aspirin EC  81 mg Oral Daily   atorvastatin  80 mg Oral Daily   calcitRIOL  0.25 mcg Oral Daily   cholecalciferol  1,000 Units Oral Daily   citalopram  10 mg Oral Daily   clopidogrel  75 mg Oral Daily   famotidine  20 mg Oral Daily   finasteride  5 mg Oral Daily   Ipratropium-Albuterol  1 puff Inhalation Q6H   latanoprost  1 drop Both Eyes QHS   metoprolol tartrate  25 mg Oral BID   pantoprazole  40 mg Oral Daily   predniSONE  40 mg Oral Q breakfast   tamsulosin  0.8 mg Oral Daily   traZODone  150 mg Oral QHS    Antimicrobials: Anti-infectives (From admission, onward)    Start     Dose/Rate Route Frequency Ordered Stop   02/20/21 1000  trimethoprim (TRIMPEX) tablet 50 mg  Status:  Discontinued        50 mg Oral Daily 02/19/21 2351 02/20/21 1655   02/20/21 0000  amoxicillin (AMOXIL) capsule 500 mg        500 mg Oral Every 12 hours 02/19/21 2351 02/24/21 2359   02/19/21 2300  ceFAZolin (ANCEF) IVPB 1 g/50 mL premix        1 g 100 mL/hr over 30 Minutes Intravenous  Once 02/19/21 2256 02/20/21 0008       PRN meds: acetaminophen **OR** acetaminophen, albuterol, ondansetron **OR** ondansetron (ZOFRAN) IV   Objective: Vitals:   02/26/21 0541 02/26/21 0817  BP: (!) 153/60 (!) 189/52  Pulse: (!) 53 (!) 58  Resp:  18  Temp: 97.6 F (36.4 C) 97.6 F (36.4 C)  SpO2:  100% 100%    Intake/Output Summary (Last 24 hours) at 02/26/2021 1307 Last data filed at 02/26/2021 0600 Gross per 24 hour  Intake 370.13 ml  Output 350 ml  Net 20.13 ml   Filed Weights   02/19/21 2056  Weight: 63.5 kg   Weight change:  Body mass index is 17.97 kg/m.   Physical Exam: General exam: Elderly Caucasian male.  Looks weak.  Not in physical distress Skin: No rashes, lesions or ulcers. HEENT: Atraumatic, normocephalic, no obvious bleeding Lungs: Clear to auscultation bilaterally CVS: Regular rate and rhythm, no murmur GI/Abd soft, nontender, nondistended, bowel sound present CNS: Alert, awake, oriented to place only, not to person or time. Psychiatry: Depressed look Extremities: No pedal edema, no calf  tenderness  Data Review: I have personally reviewed the laboratory data and studies available.  Recent Labs  Lab 02/21/21 0519 02/22/21 0525 02/23/21 0510 02/24/21 0522 02/25/21 0512  WBC 4.9 4.5 4.5 3.3* 6.3  NEUTROABS 3.4 3.0 2.7 2.8 5.1  HGB 9.8* 9.1* 9.2* 9.3* 9.8*  HCT 29.5* 27.2* 27.7* 27.5* 29.6*  MCV 99.0 98.9 96.5 96.2 98.0  PLT 105* 102* 98* 98* 105*   Recent Labs  Lab 02/20/21 0331 02/21/21 0519 02/24/21 0522 02/25/21 0512 02/26/21 0704  NA 138 141 136 138 139  K 3.6 4.1 5.0 4.7 4.9  CL 110 112* 111 111 110  CO2 24 22 19* 20* 20*  GLUCOSE 100* 76 117* 106* 96  BUN 34* 37* 55* 61* 59*  CREATININE 3.09* 3.05* 3.17* 2.89* 2.77*  CALCIUM 8.2* 8.4* 8.1* 8.4* 8.6*  PHOS  --   --  3.1  --   --     F/u labs ordered Unresulted Labs (From admission, onward)     Start     Ordered   02/25/21 [id number]  Basic metabolic panel  Daily,   R     Question:  Specimen collection method  Answer:  Lab=Lab collect   02/24/21 1616            Signed, Terrilee Croak, MD [facility] 02/26/2021

## 2021-02-26 NOTE — Progress Notes (Signed)
Mobility Specialist - Progress Note   02/26/21 1600  Mobility  Activity Refused mobility  Mobility performed by Mobility specialist    Pt declined mobility this date, no reason specified. Encouraged pt to transfer to recliner, continued to decline. Will attempt session another date/time.    Kathee Delton Mobility Specialist 02/26/21, 4:49 PM

## 2021-02-27 DIAGNOSIS — I10 Essential (primary) hypertension: Secondary | ICD-10-CM | POA: Diagnosis not present

## 2021-02-27 DIAGNOSIS — W19XXXA Unspecified fall, initial encounter: Secondary | ICD-10-CM | POA: Diagnosis not present

## 2021-02-27 LAB — BASIC METABOLIC PANEL
Anion gap: 7 (ref 5–15)
BUN: 64 mg/dL — ABNORMAL HIGH (ref 8–23)
CO2: 21 mmol/L — ABNORMAL LOW (ref 22–32)
Calcium: 8.6 mg/dL — ABNORMAL LOW (ref 8.9–10.3)
Chloride: 110 mmol/L (ref 98–111)
Creatinine, Ser: 2.73 mg/dL — ABNORMAL HIGH (ref 0.61–1.24)
GFR, Estimated: 22 mL/min — ABNORMAL LOW (ref >60)
Glucose, Bld: 102 mg/dL — ABNORMAL HIGH (ref 70–99)
Potassium: 5.6 mmol/L — ABNORMAL HIGH (ref 3.5–5.1)
Sodium: 138 mmol/L (ref 135–145)

## 2021-02-27 MED ORDER — SODIUM ZIRCONIUM CYCLOSILICATE 5 G PO PACK
5.0000 g | PACK | Freq: Once | ORAL | Status: AC
  Administered 2021-02-27: 5 g via ORAL
  Filled 2021-02-27: qty 1

## 2021-02-27 MED ORDER — AMLODIPINE BESYLATE 5 MG PO TABS
5.0000 mg | ORAL_TABLET | Freq: Every day | ORAL | Status: DC
  Administered 2021-02-27 – 2021-02-28 (×2): 5 mg via ORAL
  Filled 2021-02-27 (×2): qty 1

## 2021-02-27 MED ORDER — METOPROLOL TARTRATE 25 MG PO TABS
12.5000 mg | ORAL_TABLET | Freq: Two times a day (BID) | ORAL | Status: DC
  Administered 2021-02-27 – 2021-03-03 (×9): 12.5 mg via ORAL
  Filled 2021-02-27 (×8): qty 1

## 2021-02-27 NOTE — Progress Notes (Signed)
PT Cancellation Note  Patient Details Name: Max Ellis MRN: [id number] DOB: 07/17/1934   Cancelled Treatment:     PT attempt. Pt cleared by MD to participate (K+ 5.6) however pt unwilling. Max encouragement however pt states," I just need to go home." Author explained that mobility is an important part for getting DC however pt remained resistive. Acute PT will continue to follow and progress as able per pt willingness.    Willette Pa 02/27/2021, 1:44 PM

## 2021-02-27 NOTE — Progress Notes (Signed)
Mobility Specialist - Progress Note   02/27/21 1000  Mobility  Activity Contraindicated/medical hold  Mobility performed by Mobility specialist    Per chart review, pt with elevated K+ of 5.6 this date, contraindicating mobility. Will attempt session another date/time as medically appropriate.    Kathee Delton Mobility Specialist 02/27/21, 10:31 AM

## 2021-02-27 NOTE — Progress Notes (Addendum)
PROGRESS NOTE  KHIEM HUNN  DOB: 04/10/1934  PCP: Maryland Pink, MD [id number]  DOA: 02/19/2021  LOS: 8 days  Hospital Day: 9   Chief Complaint  Patient presents with   Fall    Brief narrative: Max Ellis is a 87 y.o. male with PMH significant for COPD, HTN, PVD, CKD stage IV, recent NSTEMI and covid-19 infection.  During that hospitalization, patient received monoclonal antibody for covid-19 infection. Despite multiple recommendations for discharge to SNF, the patient and family declined and the patient was discharged home with maximal home health support. In the ED, he had a low grade fever without normal WBC. CT head and cervical spine were without acute changes.  Admitted to hospitalist service. PT recommended SNF.  Patient still refuses to accept the recommendation.  Subjective: Patient was seen and examined this morning.  Slumped up in bed.  Opens eyes and verbal command.  Not much conversational. Telemetry reviewed.  Mostly bradycardic overnight in 50s.  Blood pressure elevated to 180s.  Labs this morning with potassium elevated to 5.6.  Assessment/Plan: Covid-19 infection -Negative CXR.  Primary symptoms are weakness, encephalopathy. -s/p MAB 7/20. -Feeling of the room air. -Remain in isolation for 10 days.   Fall, generalized weakness -In last admission, patient was recommended for discharge to SNF but he chose to go home.  Within less than 24 hours, he returned back to ED after a fall at home. -He continues to remain weak.  For the first few days in the hospital, he refused to consider SNF.  Psychiatry consultation was obtained.  Patient does not have capacity to make informed decisions for himself.  Multiple conversation done with patient's family who agree with the recommendation of SNF. -Patient finally agreed for placement.  SNF bed search underway.  UTI -Continue amoxicillin x7 days (put stop date) per culture data (sensitive enterococcus).   BPH,  urinary retention -Passed voiding trial this hospitalization. -Continue home medications.    AKI on CKD stage IV -Baseline creatinine was 2.78 in 2020.  Creatinine was elevated to 3.27 on 7/25.  With IV hydration, creatinine improved to baseline at 2.73 today.  IV fluid hydration stopped. Recent Labs    02/16/21 0603 02/18/21 0257 02/19/21 0616 02/19/21 2231 02/20/21 0331 02/21/21 0519 02/24/21 0522 02/25/21 0512 02/26/21 0704 02/27/21 0522  BUN 39* 38* 32* 32* 34* 37* 55* 61* 59* 64*  CREATININE 3.22* 3.14* 3.11* 3.16* 3.09* 3.05* 3.17* 2.89* 2.77* 2.73*   Hypokalemia -Potassium level elevated to 5.6 this morning.  Patient is not on potassium supplementation. -I will give a dose of Lokelma. -Obtain baseline EKG Recent Labs  Lab 02/21/21 0519 02/24/21 0522 02/25/21 0512 02/26/21 0704 02/27/21 0522  K 4.1 5.0 4.7 4.9 5.6*  PHOS  --  3.1  --   --   --    Sinus bradycardia HTN -Currently on metoprolol 25 mg twice daily but has sinus bradycardia and elevated blood pressure.   -This morning I reduced the dose of Lopressor to 12.5 mg twice daily and added amlodipine 5 mg daily.  Continue to monitor blood pressure and heart rate.  Acute metabolic encephalopathy -Neuroimaging on readmission was negative -Avoid ativan as much as possible. Pt did not have significant agitation/delirium during past hospitalization, so suspect this may be related to covid infection. Also may have contribution by concussion.  -Mental status seems to be fluctuating, currently he is oriented to place only.     CAD, recent NSTEMI -No anginal complaints at this time. -  Continue DAPT, statin, beta blocker   GERD - PPI   Anemia of CKD -Hemoglobin at baseline.   Thrombocytopenia -Low but stable. Recent Labs  Lab 02/21/21 0519 02/22/21 0525 02/23/21 0510 02/24/21 0522 02/25/21 0512  PLT 105* 102* 98* 98* 105*    Tobacco use -Nicotine patch    COPD with acute exacerbation -Continue  bronchodilators.  Mobility: PT eval obtained, SNF recommended Code Status:   Code Status: DNR  Nutritional status: Body mass index is 17.97 kg/m.     Diet:  Diet Order             Diet Heart Room service appropriate? Yes; Fluid consistency: Thin  Diet effective now                  DVT prophylaxis: SCDs    Antimicrobials: Completed Fluid: Off IV fluid Consultants: None Family Communication: None at bedside  Status is: Inpatient  Remains inpatient appropriate because: Continues to be weak, needs SNF placement  Dispo: The patient is from: Home              Anticipated d/c is to: SNF when bed available              Patient currently is medically stable to d/c.   Difficult to place patient No     Infusions:     Scheduled Meds:  amLODipine  5 mg Oral Daily   aspirin EC  81 mg Oral Daily   atorvastatin  80 mg Oral Daily   calcitRIOL  0.25 mcg Oral Daily   cholecalciferol  1,000 Units Oral Daily   citalopram  10 mg Oral Daily   clopidogrel  75 mg Oral Daily   famotidine  20 mg Oral Daily   finasteride  5 mg Oral Daily   Ipratropium-Albuterol  1 puff Inhalation Q6H   latanoprost  1 drop Both Eyes QHS   metoprolol tartrate  12.5 mg Oral BID   pantoprazole  40 mg Oral Daily   sodium zirconium cyclosilicate  5 g Oral Once   tamsulosin  0.8 mg Oral Daily   traZODone  150 mg Oral QHS    Antimicrobials: Anti-infectives (From admission, onward)    Start     Dose/Rate Route Frequency Ordered Stop   02/20/21 1000  trimethoprim (TRIMPEX) tablet 50 mg  Status:  Discontinued        50 mg Oral Daily 02/19/21 2351 02/20/21 1655   02/20/21 0000  amoxicillin (AMOXIL) capsule 500 mg        500 mg Oral Every 12 hours 02/19/21 2351 02/24/21 2359   02/19/21 2300  ceFAZolin (ANCEF) IVPB 1 g/50 mL premix        1 g 100 mL/hr over 30 Minutes Intravenous  Once 02/19/21 2256 02/20/21 0008       PRN meds: acetaminophen **OR** acetaminophen, albuterol, ondansetron **OR**  ondansetron (ZOFRAN) IV   Objective: Vitals:   02/27/21 0625 02/27/21 0804  BP: (!) 181/56 (!) 186/51  Pulse: (!) 55 (!) 52  Resp: 16 18  Temp: 97.8 F (36.6 C) 98.6 F (37 C)  SpO2: 99% 100%    Intake/Output Summary (Last 24 hours) at 02/27/2021 1112 Last data filed at 02/26/2021 2223 Gross per 24 hour  Intake --  Output 150 ml  Net -150 ml   Filed Weights   02/19/21 2056  Weight: 63.5 kg   Weight change:  Body mass index is 17.97 kg/m.   Physical Exam: General exam: Elderly Caucasian male.  Looks weak.  Not in physical distress Skin: No rashes, lesions or ulcers. HEENT: Atraumatic, normocephalic, no obvious bleeding Lungs: Clear to auscultation bilaterally.  No wheezing or crackles. CVS: Regular rate and rhythm, no murmur GI/Abd soft, nontender, nondistended, bowel sound present CNS: Slumped up in bed.  Opens eyes on verbal command.  Oriented to place. Psychiatry: Depressed look Extremities: No pedal edema, no calf tenderness  Data Review: I have personally reviewed the laboratory data and studies available.  Recent Labs  Lab 02/21/21 0519 02/22/21 0525 02/23/21 0510 02/24/21 0522 02/25/21 0512  WBC 4.9 4.5 4.5 3.3* 6.3  NEUTROABS 3.4 3.0 2.7 2.8 5.1  HGB 9.8* 9.1* 9.2* 9.3* 9.8*  HCT 29.5* 27.2* 27.7* 27.5* 29.6*  MCV 99.0 98.9 96.5 96.2 98.0  PLT 105* 102* 98* 98* 105*   Recent Labs  Lab 02/21/21 0519 02/24/21 0522 02/25/21 0512 02/26/21 0704 02/27/21 0522  NA 141 136 138 139 138  K 4.1 5.0 4.7 4.9 5.6*  CL 112* 111 111 110 110  CO2 22 19* 20* 20* 21*  GLUCOSE 76 117* 106* 96 102*  BUN 37* 55* 61* 59* 64*  CREATININE 3.05* 3.17* 2.89* 2.77* 2.73*  CALCIUM 8.4* 8.1* 8.4* 8.6* 8.6*  PHOS  --  3.1  --   --   --     F/u labs ordered Unresulted Labs (From admission, onward)    None       Signed, Terrilee Croak, MD [facility] 02/27/2021

## 2021-02-28 DIAGNOSIS — I10 Essential (primary) hypertension: Secondary | ICD-10-CM | POA: Diagnosis not present

## 2021-02-28 DIAGNOSIS — W19XXXA Unspecified fall, initial encounter: Secondary | ICD-10-CM | POA: Diagnosis not present

## 2021-02-28 MED ORDER — AMLODIPINE BESYLATE 10 MG PO TABS
10.0000 mg | ORAL_TABLET | Freq: Every day | ORAL | Status: DC
  Administered 2021-02-28 – 2021-03-03 (×4): 10 mg via ORAL
  Filled 2021-02-28 (×4): qty 1

## 2021-02-28 NOTE — Progress Notes (Signed)
PROGRESS NOTE  HOA FRAIM  DOB: 11-04-1933  PCP: Maryland Pink, MD [id number]  DOA: 02/19/2021  LOS: 9 days  Hospital Day: 34   Chief Complaint  Patient presents with   Fall    Brief narrative: Max Ellis is a 87 y.o. male with PMH significant for COPD, HTN, PVD, CKD stage IV, recent NSTEMI and covid-19 infection.  During that hospitalization, patient received monoclonal antibody for covid-19 infection. Despite multiple recommendations for discharge to SNF, the patient and family declined and the patient was discharged home with maximal home health support. In the ED, he had a low grade fever without normal WBC. CT head and cervical spine were without acute changes.  Admitted to hospitalist service. PT recommended SNF.  Patient still refuses to accept the recommendation.  Subjective: Patient was seen and examined this morning.  Not in distress.  Sitting up in bed.  No new symptoms.  Assessment/Plan: Covid-19 infection -Negative CXR. Primary symptoms are weakness, encephalopathy. -s/p MAB 7/20. -Feeling of the room air. -Remains in isolation for 10 days.   Fall, generalized weakness -In last admission, patient was recommended for discharge to SNF but he chose to go home.  Within less than 24 hours, he returned back to ED after a fall at home. -He continues to remain weak.  For the first few days in the hospital, he refused to consider SNF.  Psychiatry consultation was obtained.  Patient does not have capacity to make informed decisions for himself.  Multiple conversation done with patient's family who agree with the recommendation of SNF. -Patient finally agreed for placement.  SNF bed search underway.  UTI -Continue amoxicillin x7 days (put stop date) per culture data (sensitive enterococcus).   BPH, urinary retention -Passed voiding trial this hospitalization. -Continue home medications.    AKI on CKD stage IV -Baseline creatinine was 2.78 in 2020.  Creatinine  was elevated to 3.27 on 7/25.  With IV hydration, creatinine improved to baseline at 2.73 today.  IV fluid hydration stopped. Recent Labs    02/16/21 0603 02/18/21 0257 02/19/21 0616 02/19/21 2231 02/20/21 0331 02/21/21 0519 02/24/21 0522 02/25/21 0512 02/26/21 0704 02/27/21 0522  BUN 39* 38* 32* 32* 34* 37* 55* 61* 59* 64*  CREATININE 3.22* 3.14* 3.11* 3.16* 3.09* 3.05* 3.17* 2.89* 2.77* 2.73*   Hypokalemia -Potassium level was elevated to 5.6 yesterday.  Lokelma was given.  Repeat BMP tomorrow. Recent Labs  Lab 02/24/21 0522 02/25/21 0512 02/26/21 0704 02/27/21 0522  K 5.0 4.7 4.9 5.6*  PHOS 3.1  --   --   --    Sinus bradycardia HTN -Currently on metoprolol 12.5 mg twice daily and amlodipine 5 mg daily.  Continue to monitor blood pressure and heart rate.    Acute metabolic encephalopathy -Neuroimaging on readmission was negative -Avoid ativan as much as possible. Pt did not have significant agitation/delirium during past hospitalization, so suspect this may be related to covid infection. Also may have contribution by concussion.  -Mental status seems to be fluctuating, currently he is oriented to place only.     CAD, recent NSTEMI -No anginal complaints at this time. -Continue DAPT, statin, beta blocker   GERD - PPI   Anemia of CKD -Hemoglobin at baseline.   Thrombocytopenia -Low but stable. Recent Labs  Lab 02/22/21 0525 02/23/21 0510 02/24/21 0522 02/25/21 0512  PLT 102* 98* 98* 105*    Tobacco use -Nicotine patch    COPD with acute exacerbation -Continue bronchodilators.  Mobility: PT eval obtained,  SNF recommended Code Status:   Code Status: DNR  Nutritional status: Body mass index is 17.97 kg/m.     Diet:  Diet Order             Diet Heart Room service appropriate? Yes; Fluid consistency: Thin  Diet effective now                  DVT prophylaxis: SCDs    Antimicrobials: Completed Fluid: Off IV fluid Consultants:  None Family Communication: None at bedside  Status is: Inpatient  Remains inpatient appropriate because: Continues to be weak, needs SNF placement  Dispo: The patient is from: Home              Anticipated d/c is to: SNF when bed available              Patient currently is medically stable to d/c.   Difficult to place patient No     Infusions:     Scheduled Meds:  amLODipine  10 mg Oral Daily   aspirin EC  81 mg Oral Daily   atorvastatin  80 mg Oral Daily   calcitRIOL  0.25 mcg Oral Daily   cholecalciferol  1,000 Units Oral Daily   citalopram  10 mg Oral Daily   clopidogrel  75 mg Oral Daily   famotidine  20 mg Oral Daily   finasteride  5 mg Oral Daily   Ipratropium-Albuterol  1 puff Inhalation Q6H   latanoprost  1 drop Both Eyes QHS   metoprolol tartrate  12.5 mg Oral BID   pantoprazole  40 mg Oral Daily   tamsulosin  0.8 mg Oral Daily   traZODone  150 mg Oral QHS    Antimicrobials: Anti-infectives (From admission, onward)    Start     Dose/Rate Route Frequency Ordered Stop   02/20/21 1000  trimethoprim (TRIMPEX) tablet 50 mg  Status:  Discontinued        50 mg Oral Daily 02/19/21 2351 02/20/21 1655   02/20/21 0000  amoxicillin (AMOXIL) capsule 500 mg        500 mg Oral Every 12 hours 02/19/21 2351 02/24/21 2359   02/19/21 2300  ceFAZolin (ANCEF) IVPB 1 g/50 mL premix        1 g 100 mL/hr over 30 Minutes Intravenous  Once 02/19/21 2256 02/20/21 0008       PRN meds: acetaminophen **OR** acetaminophen, albuterol, ondansetron **OR** ondansetron (ZOFRAN) IV   Objective: Vitals:   02/28/21 0650 02/28/21 0831  BP: (!) 183/51 (!) 163/47  Pulse: (!) 57 (!) 57  Resp:  20  Temp:  97.8 F (36.6 C)  SpO2:  100%    Intake/Output Summary (Last 24 hours) at 02/28/2021 1446 Last data filed at 02/28/2021 0655 Gross per 24 hour  Intake --  Output 550 ml  Net -550 ml   Filed Weights   02/19/21 2056  Weight: 63.5 kg   Weight change:  Body mass index is 17.97  kg/m.   Physical Exam: General exam: Elderly Caucasian male.  Looks weak.  Not in physical distress Skin: No rashes, lesions or ulcers. HEENT: Atraumatic, normocephalic, no obvious bleeding Lungs: Clear to auscultation bilaterally.  No wheezing or crackles. CVS: Regular rate and rhythm, no murmur GI/Abd soft, nontender, nondistended, bowel sound present CNS: Slumped up in bed.  Opens eyes on verbal command.  Oriented to place. Psychiatry: Depressed look Extremities: No pedal edema, no calf tenderness  Data Review: I have personally reviewed the laboratory data  and studies available.  Recent Labs  Lab 02/22/21 0525 02/23/21 0510 02/24/21 0522 02/25/21 0512  WBC 4.5 4.5 3.3* 6.3  NEUTROABS 3.0 2.7 2.8 5.1  HGB 9.1* 9.2* 9.3* 9.8*  HCT 27.2* 27.7* 27.5* 29.6*  MCV 98.9 96.5 96.2 98.0  PLT 102* 98* 98* 105*   Recent Labs  Lab 02/24/21 0522 02/25/21 0512 02/26/21 0704 02/27/21 0522  NA 136 138 139 138  K 5.0 4.7 4.9 5.6*  CL 111 111 110 110  CO2 19* 20* 20* 21*  GLUCOSE 117* 106* 96 102*  BUN 55* 61* 59* 64*  CREATININE 3.17* 2.89* 2.77* 2.73*  CALCIUM 8.1* 8.4* 8.6* 8.6*  PHOS 3.1  --   --   --     F/u labs ordered Unresulted Labs (From admission, onward)     Start     Ordered   03/01/21 [id number]  Basic metabolic panel  Tomorrow morning,   R       Question:  Specimen collection method  Answer:  Lab=Lab collect   02/28/21 0802            Signed, Terrilee Croak, MD [facility] 02/28/2021

## 2021-02-28 NOTE — Progress Notes (Signed)
Mobility Specialist - Progress Note   02/28/21 1600  Mobility  Activity Ambulated to bathroom  Level of Assistance Standby assist, set-up cues, supervision of patient - no hands on  Assistive Device Front wheel walker  Distance Ambulated (ft) 30 ft  Mobility Ambulated with assistance in room  Mobility Response Tolerated well  Mobility performed by Mobility specialist  $Mobility charge 1 Mobility    Pt lying in bed upon arrival, utilizing RA. Pt sat EOB with minA and stood with supervision. Noted soiled sheets d/t large BM. Pt ambulated to bathroom to continue BM. No LOB with RW---supervision. Mobility provided clean linen and new gown. MinA to stand from standard toilet. Assistance with peri-care. Pt returned to bed with alarm set.    Kathee Delton Mobility Specialist 02/28/21, 4:22 PM

## 2021-02-28 NOTE — Care Management Important Message (Signed)
Important Message  Patient Details  Name: Max Ellis MRN: [id number] Date of Birth: 1934-03-31   Medicare Important Message Given:  Yes  Patient's son, Kainoah Diles returned my call and I reviewed the Important Message from Medicare with him. Said he had received the initial copy in the mail this week and did not need another copy.  I thanked him for his time.   Juliann Pulse A Kayla Deshaies 02/28/2021, 2:39 PM

## 2021-02-28 NOTE — Plan of Care (Signed)
  Problem: Clinical Measurements: Goal: Ability to maintain clinical measurements within normal limits will improve 02/28/2021 0656 by Ribay-Pagdatoon, Audrea Muscat, RN Outcome: Progressing 02/28/2021 0638 by Ribay-Pagdatoon, Audrea Muscat, RN Outcome: Progressing Goal: Will remain free from infection 02/28/2021 0656 by Ribay-Pagdatoon, Audrea Muscat, RN Outcome: Progressing 02/28/2021 0638 by Ribay-Pagdatoon, Audrea Muscat, RN Outcome: Progressing Goal: Diagnostic test results will improve 02/28/2021 0656 by Ribay-Pagdatoon, Audrea Muscat, RN Outcome: Progressing 02/28/2021 0638 by Ribay-Pagdatoon, Audrea Muscat, RN Outcome: Progressing Goal: Respiratory complications will improve 02/28/2021 0656 by Ribay-Pagdatoon, Audrea Muscat, RN Outcome: Progressing 02/28/2021 [id number] by Ribay-Pagdatoon, Audrea Muscat, RN Outcome: Progressing Goal: Cardiovascular complication will be avoided Outcome: Progressing   Problem: Pain Managment: Goal: General experience of comfort will improve Outcome: Progressing   Pt is calm and cooperative. Oriented but forgetful. Bp elevated this morning but HR is in 50s. Norvasc given early this morning. No complaints of pain.

## 2021-02-28 NOTE — Progress Notes (Signed)
PT Cancellation Note  Patient Details Name: Max Ellis MRN: [id number] DOB: [birth year]   Cancelled Treatment:     PT attempt. Pt was long sitting in bed upon arriving. He is alert but does present with cognitive deficits. He refused all activity/assistance. "I want to go home." Author explained that he needs to participate with care for MDs to feel confident with letting him go home. " I'll get up and walk when I get home." Pt then proceeds to get agitated and requested author leave room. Acute PT will continue efforts and will continue to follow per current POC.    Willette Pa 02/28/2021, 2:44 PM

## 2021-02-28 NOTE — Care Management Important Message (Signed)
Important Message  Patient Details  Name: Max Ellis MRN: [id number] Date of Birth: May 10, 1934   Medicare Important Message Given:  Other (see comment)  Patient is in an isolation room so I called his room [phone] to review the Important Message from Medicare but there was no answer. Son, [name] made the SNF selection so tried calling him [phone]. Left a message for a return call. Will try again.   Juliann Pulse A Starsha Morning 02/28/2021, 1:53 PM

## 2021-03-01 DIAGNOSIS — W19XXXA Unspecified fall, initial encounter: Secondary | ICD-10-CM | POA: Diagnosis not present

## 2021-03-01 DIAGNOSIS — I10 Essential (primary) hypertension: Secondary | ICD-10-CM | POA: Diagnosis not present

## 2021-03-01 LAB — BASIC METABOLIC PANEL
Anion gap: 3 — ABNORMAL LOW (ref 5–15)
BUN: 67 mg/dL — ABNORMAL HIGH (ref 8–23)
CO2: 23 mmol/L (ref 22–32)
Calcium: 8.7 mg/dL — ABNORMAL LOW (ref 8.9–10.3)
Chloride: 114 mmol/L — ABNORMAL HIGH (ref 98–111)
Creatinine, Ser: 2.92 mg/dL — ABNORMAL HIGH (ref 0.61–1.24)
GFR, Estimated: 20 mL/min — ABNORMAL LOW (ref >60)
Glucose, Bld: 84 mg/dL (ref 70–99)
Potassium: 5.1 mmol/L (ref 3.5–5.1)
Sodium: 140 mmol/L (ref 135–145)

## 2021-03-01 NOTE — Progress Notes (Signed)
PROGRESS NOTE  Max Ellis  DOB: 05-25-1934  PCP: Maryland Pink, MD [id number]  DOA: 02/19/2021  LOS: 10 days  Hospital Day: 50   Chief Complaint  Patient presents with   Fall    Brief narrative: Max Ellis is a 87 y.o. male with PMH significant for COPD, HTN, PVD, CKD stage IV, recent NSTEMI and covid-19 infection.  During that hospitalization, patient received monoclonal antibody for covid-19 infection. Despite multiple recommendations for discharge to SNF, the patient and family declined and the patient was discharged home with maximal home health support. In the ED, he had a low grade fever without normal WBC. CT head and cervical spine were without acute changes.  Admitted to hospitalist service. PT recommended SNF.  Patient still refuses to accept the recommendation.  Subjective: Patient was seen and examined this morning.  Lying on bed.  Not in distress.  No new symptoms.  Assessment/Plan: Covid-19 infection -Negative CXR. Primary symptoms are weakness, encephalopathy. -s/p MAB 7/20. -Currently breathing on room air.  Room air. -Remains in isolation for 10 days.   Fall, generalized weakness -In last admission, patient was recommended for discharge to SNF but he chose to go home.  Within less than 24 hours, he returned back to ED after a fall at home. -He continues to remain weak.  For the first few days in the hospital, he refused to consider SNF.  Psychiatry consultation was obtained.  Patient does not have capacity to make informed decisions for himself.  Multiple conversation done with patient's family who agree with the recommendation of SNF. -Patient finally agreed for placement.  SNF bed search underway.  UTI -Continue amoxicillin x7 days (put stop date) per culture data (sensitive enterococcus).   BPH, urinary retention -Passed voiding trial this hospitalization. -Continue home medications.    AKI on CKD stage IV -Baseline creatinine was 2.78 in  2020.  Creatinine was elevated to 3.27 on 7/25.  With IV hydration, creatinine improved to baseline at 2.73 today.  IV fluid hydration stopped. Recent Labs    02/18/21 0257 02/19/21 0616 02/19/21 2231 02/20/21 0331 02/21/21 0519 02/24/21 0522 02/25/21 0512 02/26/21 0704 02/27/21 0522 03/01/21 0437  BUN 38* 32* 32* 34* 37* 55* 61* 59* 64* 67*  CREATININE 3.14* 3.11* 3.16* 3.09* 3.05* 3.17* 2.89* 2.77* 2.73* 2.92*    Hypokalemia -Potassium level was elevated to 5.6 yesterday.  Lokelma was given.  Repeat BMP tomorrow. Recent Labs  Lab 02/24/21 0522 02/25/21 0512 02/26/21 0704 02/27/21 0522 03/01/21 0437  K 5.0 4.7 4.9 5.6* 5.1  PHOS 3.1  --   --   --   --     Sinus bradycardia Presented hypertension -Currently on metoprolol 12.5 mg twice daily and amlodipine 5 mg daily.  Systolic blood pressure is elevated but diastolic is low.  MAP is acceptable.     Acute metabolic encephalopathy -Neuroimaging on readmission was negative -Avoid ativan as much as possible. Pt did not have significant agitation/delirium during past hospitalization, so suspect this may be related to covid infection. Also may have contribution by concussion.  -Mental status seems to be fluctuating, currently he is oriented to place only.     CAD, recent NSTEMI -No anginal complaints at this time. -Continue DAPT, statin, beta blocker   GERD - PPI   Anemia of CKD -Hemoglobin at baseline.   Thrombocytopenia -Low but stable. Recent Labs  Lab 02/23/21 0510 02/24/21 0522 02/25/21 0512  PLT 98* 98* 105*     Tobacco use -Nicotine  patch    COPD with acute exacerbation -Continue bronchodilators.  Mobility: PT eval obtained, SNF recommended Code Status:   Code Status: DNR  Nutritional status: Body mass index is 17.97 kg/m.     Diet:  Diet Order             Diet Heart Room service appropriate? Yes; Fluid consistency: Thin  Diet effective now                  DVT prophylaxis: SCDs     Antimicrobials: Completed Fluid: Off IV fluid Consultants: None Family Communication: None at bedside  Status is: Inpatient  Remains inpatient appropriate because: Continues to be weak, needs SNF placement  Dispo: The patient is from: Home              Anticipated d/c is to: SNF when bed availabl              Patient currently is medically stable to d/c.   Difficult to place patient No     Infusions:     Scheduled Meds:  amLODipine  10 mg Oral Daily   aspirin EC  81 mg Oral Daily   atorvastatin  80 mg Oral Daily   calcitRIOL  0.25 mcg Oral Daily   cholecalciferol  1,000 Units Oral Daily   citalopram  10 mg Oral Daily   clopidogrel  75 mg Oral Daily   famotidine  20 mg Oral Daily   finasteride  5 mg Oral Daily   Ipratropium-Albuterol  1 puff Inhalation Q6H   latanoprost  1 drop Both Eyes QHS   metoprolol tartrate  12.5 mg Oral BID   pantoprazole  40 mg Oral Daily   tamsulosin  0.8 mg Oral Daily   traZODone  150 mg Oral QHS    Antimicrobials: Anti-infectives (From admission, onward)    Start     Dose/Rate Route Frequency Ordered Stop   02/20/21 1000  trimethoprim (TRIMPEX) tablet 50 mg  Status:  Discontinued        50 mg Oral Daily 02/19/21 2351 02/20/21 1655   02/20/21 0000  amoxicillin (AMOXIL) capsule 500 mg        500 mg Oral Every 12 hours 02/19/21 2351 02/24/21 2359   02/19/21 2300  ceFAZolin (ANCEF) IVPB 1 g/50 mL premix        1 g 100 mL/hr over 30 Minutes Intravenous  Once 02/19/21 2256 02/20/21 0008       PRN meds: acetaminophen **OR** acetaminophen, albuterol, ondansetron **OR** ondansetron (ZOFRAN) IV   Objective: Vitals:   02/28/21 2053 03/01/21 0920  BP: (!) 151/62 (!) 167/57  Pulse: 66 70  Resp: 20 16  Temp: (!) 97.5 F (36.4 C) 98.8 F (37.1 C)  SpO2: 97% 98%    Intake/Output Summary (Last 24 hours) at 03/01/2021 1332 Last data filed at 03/01/2021 0507 Gross per 24 hour  Intake --  Output 300 ml  Net -300 ml    Filed Weights    02/19/21 2056  Weight: 63.5 kg   Weight change:  Body mass index is 17.97 kg/m.   Physical Exam: General exam: Elderly Caucasian male.  Not in physical distress. Skin: No rashes, lesions or ulcers. HEENT: Atraumatic, normocephalic, no obvious bleeding Lungs: Clear to auscultation bilaterally.  No wheezing or crackles. CVS: Regular rate and rhythm, no murmur GI/Abd soft, nontender, nondistended, bowel sound present CNS: Slumped up in bed.  Opens eyes on verbal command.  Oriented to place. Psychiatry: Depressed look Extremities: No  pedal edema, no calf tenderness  Data Review: I have personally reviewed the laboratory data and studies available.  Recent Labs  Lab 02/23/21 0510 02/24/21 0522 02/25/21 0512  WBC 4.5 3.3* 6.3  NEUTROABS 2.7 2.8 5.1  HGB 9.2* 9.3* 9.8*  HCT 27.7* 27.5* 29.6*  MCV 96.5 96.2 98.0  PLT 98* 98* 105*    Recent Labs  Lab 02/24/21 0522 02/25/21 0512 02/26/21 0704 02/27/21 0522 03/01/21 0437  NA 136 138 139 138 140  K 5.0 4.7 4.9 5.6* 5.1  CL 111 111 110 110 114*  CO2 19* 20* 20* 21* 23  GLUCOSE 117* 106* 96 102* 84  BUN 55* 61* 59* 64* 67*  CREATININE 3.17* 2.89* 2.77* 2.73* 2.92*  CALCIUM 8.1* 8.4* 8.6* 8.6* 8.7*  PHOS 3.1  --   --   --   --      F/u labs ordered Unresulted Labs (From admission, onward)    None       Signed, Terrilee Croak, MD [facility] 03/01/2021

## 2021-03-01 NOTE — Progress Notes (Signed)
Mobility Specialist - Progress Note   03/01/21 1100  Mobility  Activity Ambulated in room  Level of Assistance Standby assist, set-up cues, supervision of patient - no hands on  Assistive Device Front wheel walker  Distance Ambulated (ft) 60 ft  Mobility Ambulated with assistance in room  Mobility Response Tolerated well  Mobility performed by Mobility specialist  $Mobility charge 1 Mobility    Pre-mobility: 69 HR, 96% SpO2 During mobility: 101 HR, 98% SpO2 Post-mobility: 72 HR, 99% SpO2   Pt sleeping in recliner upon arrival, utilizing RA. Easily awakened to voice. Pt stood to RW with min guard and extra time. Ambulated in room with supervision, no LOB. Denied SOB. Noted some wheezing and congestive cough. Fatigued after activity. Pt returned to bed with alarm set, needs in reach.    Kathee Delton Mobility Specialist 03/01/21, 11:15 AM

## 2021-03-02 DIAGNOSIS — I10 Essential (primary) hypertension: Secondary | ICD-10-CM | POA: Diagnosis not present

## 2021-03-02 DIAGNOSIS — W19XXXA Unspecified fall, initial encounter: Secondary | ICD-10-CM | POA: Diagnosis not present

## 2021-03-02 LAB — GLUCOSE, CAPILLARY
Glucose-Capillary: 80 mg/dL (ref 70–99)
Glucose-Capillary: 109 mg/dL — ABNORMAL HIGH (ref 70–99)
Glucose-Capillary: 102 mg/dL — ABNORMAL HIGH (ref 70–99)

## 2021-03-02 NOTE — Progress Notes (Signed)
PROGRESS NOTE  Max Ellis  DOB: [birth year]  PCP: Maryland Pink, MD [id number]  DOA: 02/19/2021  LOS: 11 days  Hospital Day: 12   Chief Complaint  Patient presents with   Fall    Brief narrative: Max Ellis is a 87 y.o. male with PMH significant for COPD, HTN, PVD, CKD stage IV, recent NSTEMI and covid-19 infection.  During that hospitalization, patient received monoclonal antibody for covid-19 infection. Despite multiple recommendations for discharge to SNF, the patient and family declined and the patient was discharged home with maximal home health support. In the ED, he had a low grade fever without normal WBC. CT head and cervical spine were without acute changes.  Admitted to hospitalist service. PT recommended SNF.  Patient still refuses to accept the recommendation.  Subjective: Patient was seen and examined this morning.  Lying down in bed.  Not in distress.  Alert, awake, oriented to place and person  Assessment/Plan: Covid-19 infection -Negative CXR. Primary symptoms are weakness, encephalopathy. -s/p MAB 7/20. -Currently breathing on room air.  Room air. -Remains in isolation for 10 days.   Fall, generalized weakness -In last admission, patient was recommended for discharge to SNF but he chose to go home.  Within less than 24 hours, he returned back to ED after a fall at home. -He continues to remain weak.  For the first few days in the hospital, he refused to consider SNF.  Psychiatry consultation was obtained.  Patient does not have capacity to make informed decisions for himself.  Multiple conversation done with patient's family who agree with the recommendation of SNF. -Patient finally agreed for placement.  SNF bed search underway.  UTI -Continue amoxicillin x7 days (put stop date) per culture data (sensitive enterococcus).   BPH, urinary retention -Passed voiding trial this hospitalization. -Continue home medications.    AKI on CKD stage  IV -Baseline creatinine was 2.78 in 2020.  Creatinine was elevated to 3.27 on 7/25.  With IV hydration, creatinine improved.  Slightly up to 2.92 today.  Continue to monitor Recent Labs    02/18/21 0257 02/19/21 0616 02/19/21 2231 02/20/21 0331 02/21/21 0519 02/24/21 0522 02/25/21 0512 02/26/21 0704 02/27/21 0522 03/01/21 0437  BUN 38* 32* 32* 34* 37* 55* 61* 59* 64* 67*  CREATININE 3.14* 3.11* 3.16* 3.09* 3.05* 3.17* 2.89* 2.77* 2.73* 2.92*    Essential hypertension -Currently blood pressure and heart rate controlled on metoprolol 12.5 mg twice daily and amlodipine 5 mg daily.    Acute metabolic encephalopathy -Neuroimaging on readmission was negative -Avoid ativan as much as possible. Pt did not have significant agitation/delirium during past hospitalization, so suspect this may be related to covid infection. Also may have contribution by concussion.  -Mental status seems to be fluctuating, currently he is oriented to place only.     CAD, recent NSTEMI -No anginal complaints at this time. -Continue DAPT, statin, beta blocker   GERD - PPI   Anemia of CKD -Hemoglobin at baseline.   Thrombocytopenia -Low but stable. Recent Labs  Lab 02/24/21 0522 02/25/21 0512  PLT 98* 105*     Tobacco use -Nicotine patch    COPD with acute exacerbation -Continue bronchodilators.  Mobility: PT eval obtained, SNF recommended Code Status:   Code Status: DNR  Nutritional status: Body mass index is 17.97 kg/m.     Diet:  Diet Order             Diet Heart Room service appropriate? Yes; Fluid consistency: Thin  Diet  effective now                  DVT prophylaxis: SCDs    Antimicrobials: Completed Fluid: Off IV fluid Consultants: None Family Communication: None at bedside  Status is: Inpatient  Remains inpatient appropriate because: Continues to be weak, needs SNF placement  Dispo: The patient is from: Home              Anticipated d/c is to: SNF when bed  available              Patient currently is medically stable to d/c.   Difficult to place patient No     Infusions:     Scheduled Meds:  amLODipine  10 mg Oral Daily   aspirin EC  81 mg Oral Daily   atorvastatin  80 mg Oral Daily   calcitRIOL  0.25 mcg Oral Daily   cholecalciferol  1,000 Units Oral Daily   citalopram  10 mg Oral Daily   clopidogrel  75 mg Oral Daily   famotidine  20 mg Oral Daily   finasteride  5 mg Oral Daily   Ipratropium-Albuterol  1 puff Inhalation Q6H   latanoprost  1 drop Both Eyes QHS   metoprolol tartrate  12.5 mg Oral BID   pantoprazole  40 mg Oral Daily   tamsulosin  0.8 mg Oral Daily   traZODone  150 mg Oral QHS    Antimicrobials: Anti-infectives (From admission, onward)    Start     Dose/Rate Route Frequency Ordered Stop   02/20/21 1000  trimethoprim (TRIMPEX) tablet 50 mg  Status:  Discontinued        50 mg Oral Daily 02/19/21 2351 02/20/21 1655   02/20/21 0000  amoxicillin (AMOXIL) capsule 500 mg        500 mg Oral Every 12 hours 02/19/21 2351 02/24/21 2359   02/19/21 2300  ceFAZolin (ANCEF) IVPB 1 g/50 mL premix        1 g 100 mL/hr over 30 Minutes Intravenous  Once 02/19/21 2256 02/20/21 0008       PRN meds: acetaminophen **OR** acetaminophen, albuterol, ondansetron **OR** ondansetron (ZOFRAN) IV   Objective: Vitals:   03/02/21 0430 03/02/21 0741  BP: 135/62 (!) 155/49  Pulse: 65 69  Resp: 16 18  Temp: 98.7 F (37.1 C) 98.1 F (36.7 C)  SpO2: 99% 98%    Intake/Output Summary (Last 24 hours) at 03/02/2021 1316 Last data filed at 03/01/2021 1900 Gross per 24 hour  Intake --  Output 100 ml  Net -100 ml    Filed Weights   02/19/21 2056  Weight: 63.5 kg   Weight change:  Body mass index is 17.97 kg/m.   Physical Exam: General exam: Elderly Caucasian male.  Not in physical distress Skin: No rashes, lesions or ulcers. HEENT: Atraumatic, normocephalic, no obvious bleeding Lungs: Clear to auscultation bilaterally.   No wheezing or crackles. CVS: Regular rate and rhythm, no murmur GI/Abd soft, nontender, nondistended, bowel sound present CNS: Alert, awake and oriented to place and person  psychiatry: Mood appropriate Extremities: No pedal edema, no calf tenderness  Data Review: I have personally reviewed the laboratory data and studies available.  Recent Labs  Lab 02/24/21 0522 02/25/21 0512  WBC 3.3* 6.3  NEUTROABS 2.8 5.1  HGB 9.3* 9.8*  HCT 27.5* 29.6*  MCV 96.2 98.0  PLT 98* 105*    Recent Labs  Lab 02/24/21 0522 02/25/21 0512 02/26/21 0704 02/27/21 0522 03/01/21 0437  NA 136  138 139 138 140  K 5.0 4.7 4.9 5.6* 5.1  CL 111 111 110 110 114*  CO2 19* 20* 20* 21* 23  GLUCOSE 117* 106* 96 102* 84  BUN 55* 61* 59* 64* 67*  CREATININE 3.17* 2.89* 2.77* 2.73* 2.92*  CALCIUM 8.1* 8.4* 8.6* 8.6* 8.7*  PHOS 3.1  --   --   --   --      F/u labs ordered Unresulted Labs (From admission, onward)    None       Signed, Terrilee Croak, MD [facility] 03/02/2021

## 2021-03-03 DIAGNOSIS — I10 Essential (primary) hypertension: Secondary | ICD-10-CM | POA: Diagnosis not present

## 2021-03-03 DIAGNOSIS — W19XXXA Unspecified fall, initial encounter: Secondary | ICD-10-CM | POA: Diagnosis not present

## 2021-03-03 MED ORDER — METOPROLOL TARTRATE 25 MG PO TABS: 12.5000 mg | ORAL_TABLET | Freq: Two times a day (BID) | ORAL | Status: DC

## 2021-03-03 MED ORDER — AMLODIPINE BESYLATE 10 MG PO TABS: 10.0000 mg | ORAL_TABLET | Freq: Every day | ORAL | Status: DC

## 2021-03-03 MED ORDER — IPRATROPIUM-ALBUTEROL 20-100 MCG/ACT IN AERS: 1.0000 | INHALATION_SPRAY | Freq: Four times a day (QID) | RESPIRATORY_TRACT | Status: DC | PRN

## 2021-03-03 NOTE — Discharge Summary (Signed)
Physician Discharge Summary  LION SCHRANTZ [id number] DOB: 09/29/1933 DOA: 02/19/2021  PCP: Maryland Pink, MD  Admit date: 02/19/2021 Discharge date: 03/03/2021  Admitted From: Home Discharge disposition: SNF   Code Status: DNR   Discharge Diagnosis:   Principal Problem:   Fall Active Problems:   Essential hypertension   Tobacco use disorder   Chronic kidney disease, stage 4 (severe) ([place])   NSTEMI (non-ST elevated myocardial infarction) ([place])   COVID-19 virus infection   Acute lower UTI     Chief Complaint  Patient presents with   Fall    Brief narrative: SHIKEEM SCHWARK is a 87 y.o. male with PMH significant for COPD, HTN, PVD, CKD stage IV, recent NSTEMI and covid-19 infection.  During that hospitalization, patient received monoclonal antibody for covid-19 infection. Despite multiple recommendations for discharge to SNF, the patient and family declined and the patient was discharged home with maximal home health support. In the ED, he had a low grade fever without normal WBC. CT head and cervical spine were without acute changes.  Admitted to hospitalist service. See below for details Currently pending placement.  Subjective: Patient was seen and examined this morning.  Lying on bed.  Not in distress.  Arousable.  Able to answer simple questions.  Hospital course: Covid-19 infection -Negative CXR. Primary symptoms are weakness, encephalopathy. -s/p MAB 7/20. -Currently breathing on room air. -Completed 10 days of isolation.   Fall, generalized weakness -In last admission, patient was recommended for discharge to SNF but he chose to go home.  Within less than 24 hours, he returned back to ED after a fall at home. -He continues to remain weak.  For the first few days in the hospital, he refused to consider SNF.  Psychiatry consultation was obtained.  Patient does not have capacity to make informed decisions for himself.  Multiple conversation done with patient's  family who agree with the recommendation of SNF. -Patient finally agreed for placement.  He is being discharged to SNF.  UTI -Completed amoxicillin x7 days (put stop date) per culture data (sensitive enterococcus).   BPH, urinary retention -Passed voiding trial this hospitalization. -Continue home medications.    AKI on CKD stage IV -Baseline creatinine was 2.78 in 2020.  Creatinine was elevated to 3.27 on 7/25.  With IV hydration, creatinine improved.  Encourage oral hydration. Recent Labs    02/18/21 0257 02/19/21 0616 02/19/21 2231 02/20/21 0331 02/21/21 0519 02/24/21 0522 02/25/21 0512 02/26/21 0704 02/27/21 0522 03/01/21 0437  BUN 38* 32* 32* 34* 37* 55* 61* 59* 64* 67*  CREATININE 3.14* 3.11* 3.16* 3.09* 3.05* 3.17* 2.89* 2.77* 2.73* 2.92*   Essential hypertension -Currently blood pressure and heart rate controlled on metoprolol 12.5 mg twice daily and amlodipine 5 mg daily.    Acute metabolic encephalopathy -Neuroimaging on readmission was negative -Avoid ativan as much as possible. Pt did not have significant agitation/delirium during past hospitalization, so suspect this may be related to covid infection. Also may have contribution by concussion.  -Mental status seems to be fluctuating, currently not restless or agitated.  Able to have a meaningful conversation   CAD, recent NSTEMI -No anginal complaints at this time. -Continue DAPT, statin, beta blocker   GERD - PPI   Anemia of CKD -Hemoglobin at baseline.   Thrombocytopenia -Low but stable. Recent Labs  Lab 02/25/21 0512  PLT 105*    Tobacco use -Nicotine patch    COPD with acute exacerbation -Continue bronchodilators.   Allergies as of 03/03/2021  No Known Allergies      Medication List     STOP taking these medications    amoxicillin 500 MG capsule Commonly known as: AMOXIL   furosemide 40 MG tablet Commonly known as: LASIX   trimethoprim 100 MG tablet Commonly known as:  TRIMPEX       TAKE these medications    amLODipine 10 MG tablet Commonly known as: NORVASC Take 1 tablet (10 mg total) by mouth daily. Start taking on: March 04, 2021   aspirin 81 MG EC tablet Commonly known as: Aspirin 81 Take 1 tablet (81 mg total) by mouth daily.   atorvastatin 80 MG tablet Commonly known as: LIPITOR Take 1 tablet (80 mg total) by mouth daily.   calcitRIOL 0.25 MCG capsule Commonly known as: ROCALTROL Take 0.25 mcg by mouth daily.   cholecalciferol 25 MCG (1000 UNIT) tablet Commonly known as: VITAMIN D3 Take 1,000 Units by mouth daily.   citalopram 10 MG tablet Commonly known as: CELEXA Take 10 mg by mouth daily.   clopidogrel 75 MG tablet Commonly known as: PLAVIX Take 1 tablet (75 mg total) by mouth daily.   famotidine 20 MG tablet Commonly known as: PEPCID Take by mouth.   finasteride 5 MG tablet Commonly known as: PROSCAR Take 5 mg by mouth daily.   Ipratropium-Albuterol 20-100 MCG/ACT Aers respimat Commonly known as: COMBIVENT Inhale 1 puff into the lungs every 6 (six) hours as needed for wheezing.   latanoprost 0.005 % ophthalmic solution Commonly known as: XALATAN Place 1 drop into both eyes at bedtime.   metoprolol tartrate 25 MG tablet Commonly known as: LOPRESSOR Take 0.5 tablets (12.5 mg total) by mouth 2 (two) times daily. What changed: how much to take   omeprazole 20 MG capsule Commonly known as: PRILOSEC Take 20 mg by mouth daily.   tamsulosin 0.4 MG Caps capsule Commonly known as: FLOMAX Take 0.8 mg by mouth daily.   traZODone 50 MG tablet Commonly known as: DESYREL Take 150 mg by mouth at bedtime.               Discharge Care Instructions  (From admission, onward)           Start     Ordered   03/03/21 0000  No dressing needed        03/03/21 1044            Discharge Instructions:  Diet Recommendation: Cardiac diet   Follow with Primary MD Maryland Pink, MD in 7 days   Get  CBC/BMP checked in next visit within 1 week by PCP or SNF MD ( we routinely change or add medications that can affect your baseline labs and fluid status, therefore we recommend that you get the mentioned basic workup next visit with your PCP, your PCP may decide not to get them or add new tests based on their clinical decision)  On your next visit with your PCP, please Get Medicines reviewed and adjusted.  Please request your PCP  to go over all Hospital Tests and Procedure/Radiological results at the follow up, please get all Hospital records sent to your Prim MD by signing hospital release before you go home.  Activity: As tolerated with Full fall precautions use walker/cane & assistance as needed  For Heart failure patients - Check your Weight same time everyday, if you gain over 2 pounds, or you develop in leg swelling, experience more shortness of breath or chest pain, call your Primary MD immediately. Follow Cardiac  Low Salt Diet and 1.5 lit/day fluid restriction.  If you have smoked or chewed Tobacco in the last 2 yrs please stop smoking, stop any regular Alcohol  and or any Recreational drug use.  If you experience worsening of your admission symptoms, develop shortness of breath, life threatening emergency, suicidal or homicidal thoughts you must seek medical attention immediately by calling 911 or calling your MD immediately  if symptoms less severe.  You Must read complete instructions/literature along with all the possible adverse reactions/side effects for all the Medicines you take and that have been prescribed to you. Take any new Medicines after you have completely understood and accpet all the possible adverse reactions/side effects.   Do not drive, operate heavy machinery, perform activities at heights, swimming or participation in water activities or provide baby sitting services if your were admitted for syncope or siezures until you have seen by Primary MD or a Neurologist and  advised to do so again.  Do not drive when taking Pain medications.  Do not take more than prescribed Pain, Sleep and Anxiety Medications  Wear Seat belts while driving.   Please note You were cared for by a hospitalist during your hospital stay. If you have any questions about your discharge medications or the care you received while you were in the hospital after you are discharged, you can call the unit and asked to speak with the hospitalist on call if the hospitalist that took care of you is not available. Once you are discharged, your primary care physician will handle any further medical issues. Please note that NO REFILLS for any discharge medications will be authorized once you are discharged, as it is imperative that you return to your primary care physician (or establish a relationship with a primary care physician if you do not have one) for your aftercare needs so that they can reassess your need for medications and monitor your lab values.    Follow ups:    Contact information for follow-up providers     Maryland Pink, MD Follow up.   Specialty: Family Medicine Contact information: [address] [place] [address] [phone]              Contact information for after-discharge care     [facility] .   Service: Skilled Nursing Contact information: [place] [phone]                     Wound care:     Discharge Exam:   Vitals:   03/02/21 1643 03/02/21 2010 03/03/21 0430 03/03/21 0818  BP: (!) 135/48 (!) 151/52 (!) 142/60 (!) 160/52  Pulse: 60 66 65 65  Resp: '16 18 18 16  '$ Temp: 98.5 F (36.9 C) 98.2 F (36.8 C) 98 F (36.7 C) 98.6 F (37 C)  TempSrc:    Oral  SpO2: 100% 100% 100% 97%  Weight:      Height:        Body mass index is 17.97 kg/m.  General exam: Elderly Caucasian male.  Not in physical distress Skin: No  rashes, lesions or ulcers. HEENT: Atraumatic, normocephalic, no obvious bleeding Lungs: Clear to auscultation bilaterally.  No wheezing or crackles. CVS: Regular rate and rhythm, no murmur GI/Abd soft, nontender, nondistended, bowel sound present CNS: Alert, awake and oriented to place and person  psychiatry: Mood appropriate Extremities: No pedal edema, no calf  tenderness  Time coordinating discharge: 35 minutes   The results of significant diagnostics from this hospitalization (including imaging, microbiology, ancillary and laboratory) are listed below for reference.    Procedures and Diagnostic Studies:   CT Head Wo Contrast  Result Date: 02/19/2021 CLINICAL DATA:  [place], COVID-19 positive, hit head EXAM: CT HEAD WITHOUT CONTRAST TECHNIQUE: Contiguous axial images were obtained from the base of the skull through the vertex without intravenous contrast. COMPARISON:  None. FINDINGS: Brain: Confluent hypodensities throughout the periventricular and subcortical white matter are most consistent with chronic small vessel ischemic changes. 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 No signs of acute infarct or hemorrhage. There is diffuse cerebral atrophy with ex vacuo dilatation of the lateral ventricles. Midline structures are unremarkable. No acute extra-axial fluid collections. No mass effect. Vascular: Extensive atherosclerosis of the internal carotid arteries. No hyperdense vessel. Skull: Normal. Negative for fracture or focal lesion. Sinuses/Orbits: No acute finding. Other: None. IMPRESSION: 1. Chronic ischemic changes as above. No acute intracranial process. Electronically Signed   By: Randa Ngo M.D.   On: 02/19/2021 20:50   CT Cervical Spine Wo Contrast  Result Date: 02/19/2021 CLINICAL DATA:  [place], COVID-19 positive EXAM: CT CERVICAL SPINE WITHOUT CONTRAST TECHNIQUE: Multidetector CT imaging of the cervical spine was performed without intravenous contrast. Multiplanar CT image  reconstructions were also generated. COMPARISON:  None. FINDINGS: Alignment: Left convex curvature of the cervical spine. Otherwise alignment is anatomic. Skull base and vertebrae: No acute fracture. No primary bone lesion or focal pathologic process. Soft tissues and spinal canal: No prevertebral fluid or swelling. No visible canal hematoma. Disc levels: Multilevel cervical spondylosis and facet hypertrophy, with disc space narrowing most pronounced at C4-5, C5-6, and C6-7. Upper chest: Stable pleural and parenchymal density at the right apex consistent with chronic scarring, without significant change since chest CT 04/13/2019. No acute airspace disease. Central airway is patent. Other: Reconstructed images demonstrate no additional findings. IMPRESSION: 1. No acute cervical spine fracture. 2. Multilevel cervical spondylosis and facet hypertrophy as above. 3. Chronic right apical pleural and parenchymal scarring. Electronically Signed   By: Randa Ngo M.D.   On: 02/19/2021 20:52   DG Chest Portable 1 View  Result Date: 02/19/2021 CLINICAL DATA:  Fall. Fall at home. History of frequent falls. COVID positive 2 days ago. EXAM: PORTABLE CHEST 1 VIEW COMPARISON:  Radiograph 4 days ago 02/15/2021, CT 3 days ago 02/16/2021. FINDINGS: Right apical pleuroparenchymal scarring. Subsegmental atelectasis at the left lung base. Stable heart size and mediastinal contours. Aortic atherosclerosis. There is no pulmonary edema, pleural effusion, or pneumothorax. No acute osseous abnormalities are seen. IMPRESSION: No acute abnormality. Electronically Signed   By: Keith Rake M.D.   On: 02/19/2021 22:30   CT MAXILLOFACIAL WO CONTRAST  Result Date: 02/19/2021 CLINICAL DATA:  [place], COVID-19 positive, hit head EXAM: CT MAXILLOFACIAL WITHOUT CONTRAST TECHNIQUE: Multidetector CT imaging of the maxillofacial structures was performed. Multiplanar CT image reconstructions were also generated. COMPARISON:  None. FINDINGS:  Osseous: There is a minimally displaced left-sided nasal bone fracture, with overlying soft tissue swelling. No other acute facial bone fractures are identified. Orbits: Negative. No traumatic or inflammatory finding. Sinuses: Clear. Soft tissues: Prominent soft tissue swelling along the left aspect of the nasal bridge extending into the left infraorbital region. Remaining soft tissues are unremarkable. Limited intracranial: No significant or unexpected finding. IMPRESSION: 1. Minimally displaced left-sided nasal bone fracture with overlying soft tissue swelling. Electronically Signed   By: Diana Eves.D.  On: 02/19/2021 20:54     Labs:   Basic Metabolic Panel: Recent Labs  Lab 02/25/21 0512 02/26/21 0704 02/27/21 0522 03/01/21 0437  NA 138 139 138 140  K 4.7 4.9 5.6* 5.1  CL 111 110 110 114*  CO2 20* 20* 21* 23  GLUCOSE 106* 96 102* 84  BUN 61* 59* 64* 67*  CREATININE 2.89* 2.77* 2.73* 2.92*  CALCIUM 8.4* 8.6* 8.6* 8.7*   GFR Estimated Creatinine Clearance: 16.6 mL/min (A) (by C-G formula based on SCr of 2.92 mg/dL (H)). Liver Function Tests: No results for input(s): AST, ALT, ALKPHOS, BILITOT, PROT, ALBUMIN in the last 168 hours. No results for input(s): LIPASE, AMYLASE in the last 168 hours. No results for input(s): AMMONIA in the last 168 hours. Coagulation profile No results for input(s): INR, PROTIME in the last 168 hours.  CBC: Recent Labs  Lab 02/25/21 0512  WBC 6.3  NEUTROABS 5.1  HGB 9.8*  HCT 29.6*  MCV 98.0  PLT 105*   Cardiac Enzymes: No results for input(s): CKTOTAL, CKMB, CKMBINDEX, TROPONINI in the last 168 hours. BNP: Invalid input(s): POCBNP CBG: Recent Labs  Lab 03/02/21 0738 03/02/21 1135 03/02/21 1640  GLUCAP 80 109* 102*   D-Dimer No results for input(s): DDIMER in the last 72 hours. Hgb A1c No results for input(s): HGBA1C in the last 72 hours. Lipid Profile No results for input(s): CHOL, HDL, LDLCALC, TRIG, CHOLHDL, LDLDIRECT in  the last 72 hours. Thyroid function studies No results for input(s): TSH, T4TOTAL, T3FREE, THYROIDAB in the last 72 hours.  Invalid input(s): FREET3 Anemia work up No results for input(s): VITAMINB12, FOLATE, FERRITIN, TIBC, IRON, RETICCTPCT in the last 72 hours. Microbiology No results found for this or any previous visit (from the past 240 hour(s)).   Signed: Terrilee Croak  [facility] 03/03/2021, 10:44 AM

## 2021-03-03 NOTE — Care Management Important Message (Signed)
Important Message  Patient Details  Name: Max Ellis MRN: [id number] Date of Birth: 10-Jan-1934   Medicare Important Message Given:  Yes     [name] 03/03/2021, 12:28 PM

## 2021-03-03 NOTE — TOC Transition Note (Signed)
Transition of Care [facility]) - CM/SW Discharge Note   Patient Details  Name: Max Ellis MRN: [id number] Date of Birth: 1934/05/11  [facility] CM/SW Contact:  Beverly Sessions, RN Phone Number: 03/03/2021, 2:26 PM   Clinical Narrative:      Patient discharge to [facility] today Son and grandson notified  EMS packet and DNR on the chart Bedside RN to call report DC info sent in [facility] transport called    Barriers to Discharge: Continued Medical Work up   Patient Goals and CMS Choice        Discharge Placement                       Discharge Plan and Services                                     Social Determinants of Health (SDOH) Interventions     Readmission Risk Interventions Readmission Risk Prevention Plan 02/20/2021  Transportation Screening Complete  Palliative Care Screening Complete  Medication Review (RN Care Manager) Complete  Some recent data might be hidden

## 2021-03-03 NOTE — Progress Notes (Signed)
PROGRESS NOTE  [name]  DOB: October 30, 1933  PCP: Maryland Pink, MD [id number]  DOA: 02/19/2021  LOS: 12 days  Hospital Day: 31   Chief Complaint  Patient presents with   Fall    Brief narrative: Max Ellis is a 87 y.o. male with PMH significant for COPD, HTN, PVD, CKD stage IV, recent NSTEMI and covid-19 infection.  During that hospitalization, patient received monoclonal antibody for covid-19 infection. Despite multiple recommendations for discharge to SNF, the patient and family declined and the patient was discharged home with maximal home health support. In the ED, he had a low grade fever without normal WBC. CT head and cervical spine were without acute changes.  Admitted to hospitalist service. See below for details Currently pending placement.  Subjective: Patient was seen and examined this morning.  Lying on bed.  Not in distress.  Arousable.  Able to answer simple questions.  Assessment/Plan: Covid-19 infection -Negative CXR. Primary symptoms are weakness, encephalopathy. -s/p MAB 7/20. -Currently breathing on room air. -Completed 10 days of isolation.   Fall, generalized weakness -In last admission, patient was recommended for discharge to SNF but he chose to go home.  Within less than 24 hours, he returned back to ED after a fall at home. -He continues to remain weak.  For the first few days in the hospital, he refused to consider SNF.  Psychiatry consultation was obtained.  Patient does not have capacity to make informed decisions for himself.  Multiple conversation done with patient's family who agree with the recommendation of SNF. -Patient finally agreed for placement.  SNF bed search underway.  UTI -Continue amoxicillin x7 days (put stop date) per culture data (sensitive enterococcus).   BPH, urinary retention -Passed voiding trial this hospitalization. -Continue home medications.    AKI on CKD stage IV -Baseline creatinine was 2.78 in 2020.   Creatinine was elevated to 3.27 on 7/25.  With IV hydration, creatinine improved.  Slightly up to 2.92 on last check yesterday.  Encourage oral hydration..  Continue to monitor Recent Labs    02/18/21 0257 02/19/21 0616 02/19/21 2231 02/20/21 0331 02/21/21 0519 02/24/21 0522 02/25/21 0512 02/26/21 0704 02/27/21 0522 03/01/21 0437  BUN 38* 32* 32* 34* 37* 55* 61* 59* 64* 67*  CREATININE 3.14* 3.11* 3.16* 3.09* 3.05* 3.17* 2.89* 2.77* 2.73* 2.92*   Essential hypertension -Currently blood pressure and heart rate controlled on metoprolol 12.5 mg twice daily and amlodipine 5 mg daily.    Acute metabolic encephalopathy -Neuroimaging on readmission was negative -Avoid ativan as much as possible. Pt did not have significant agitation/delirium during past hospitalization, so suspect this may be related to covid infection. Also may have contribution by concussion.  -Mental status seems to be fluctuating, currently not restless or agitated.  Able to have a meaningful conversation   CAD, recent NSTEMI -No anginal complaints at this time. -Continue DAPT, statin, beta blocker   GERD - PPI   Anemia of CKD -Hemoglobin at baseline.   Thrombocytopenia -Low but stable. Recent Labs  Lab 02/25/21 0512  PLT 105*    Tobacco use -Nicotine patch    COPD with acute exacerbation -Continue bronchodilators.  Mobility: PT eval obtained, SNF recommended Code Status:   Code Status: DNR  Nutritional status: Body mass index is 17.97 kg/m.     Diet:  Diet Order             Diet Heart Room service appropriate? Yes; Fluid consistency: Thin  Diet effective now  DVT prophylaxis: SCDs    Antimicrobials: Completed Fluid: Off IV fluid Consultants: None Family Communication: None at bedside  Status is: Inpatient  Remains inpatient appropriate because: Continues to be weak, needs SNF placement  Dispo: The patient is from: Home              Anticipated d/c is to: SNF  when bed available              Patient currently is medically stable to d/c.   Difficult to place patient No     Infusions:     Scheduled Meds:  amLODipine  10 mg Oral Daily   aspirin EC  81 mg Oral Daily   atorvastatin  80 mg Oral Daily   calcitRIOL  0.25 mcg Oral Daily   cholecalciferol  1,000 Units Oral Daily   citalopram  10 mg Oral Daily   clopidogrel  75 mg Oral Daily   famotidine  20 mg Oral Daily   finasteride  5 mg Oral Daily   Ipratropium-Albuterol  1 puff Inhalation Q6H   latanoprost  1 drop Both Eyes QHS   metoprolol tartrate  12.5 mg Oral BID   pantoprazole  40 mg Oral Daily   tamsulosin  0.8 mg Oral Daily   traZODone  150 mg Oral QHS    Antimicrobials: Anti-infectives (From admission, onward)    Start     Dose/Rate Route Frequency Ordered Stop   02/20/21 1000  trimethoprim (TRIMPEX) tablet 50 mg  Status:  Discontinued        50 mg Oral Daily 02/19/21 2351 02/20/21 1655   02/20/21 0000  amoxicillin (AMOXIL) capsule 500 mg        500 mg Oral Every 12 hours 02/19/21 2351 02/24/21 2359   02/19/21 2300  ceFAZolin (ANCEF) IVPB 1 g/50 mL premix        1 g 100 mL/hr over 30 Minutes Intravenous  Once 02/19/21 2256 02/20/21 0008       PRN meds: acetaminophen **OR** acetaminophen, albuterol, ondansetron **OR** ondansetron (ZOFRAN) IV   Objective: Vitals:   03/03/21 0430 03/03/21 0818  BP: (!) 142/60 (!) 160/52  Pulse: 65 65  Resp: 18 16  Temp: 98 F (36.7 C) 98.6 F (37 C)  SpO2: 100% 97%    Intake/Output Summary (Last 24 hours) at 03/03/2021 0938 Last data filed at 03/02/2021 2028 Gross per 24 hour  Intake 480 ml  Output 200 ml  Net 280 ml   Filed Weights   02/19/21 2056  Weight: 63.5 kg   Weight change:  Body mass index is 17.97 kg/m.   Physical Exam: General exam: Elderly Caucasian male.  Not in physical distress Skin: No rashes, lesions or ulcers. HEENT: Atraumatic, normocephalic, no obvious bleeding Lungs: Clear to auscultation  bilaterally.  No wheezing or crackles. CVS: Regular rate and rhythm, no murmur GI/Abd soft, nontender, nondistended, bowel sound present CNS: Alert, awake and oriented to place and person  psychiatry: Mood appropriate Extremities: No pedal edema, no calf tenderness  Data Review: I have personally reviewed the laboratory data and studies available.  Recent Labs  Lab 02/25/21 0512  WBC 6.3  NEUTROABS 5.1  HGB 9.8*  HCT 29.6*  MCV 98.0  PLT 105*   Recent Labs  Lab 02/25/21 0512 02/26/21 0704 02/27/21 0522 03/01/21 0437  NA 138 139 138 140  K 4.7 4.9 5.6* 5.1  CL 111 110 110 114*  CO2 20* 20* 21* 23  GLUCOSE 106* 96 102* 84  BUN  61* 59* 64* 67*  CREATININE 2.89* 2.77* 2.73* 2.92*  CALCIUM 8.4* 8.6* 8.6* 8.7*    F/u labs ordered Unresulted Labs (From admission, onward)     Start     Ordered   03/04/21 0500  CBC with Differential/Platelet  Tomorrow morning,   R       Question:  Specimen collection method  Answer:  Lab=Lab collect   03/03/21 0803   03/04/21 [id number]  Basic metabolic panel  Tomorrow morning,   R       Question:  Specimen collection method  Answer:  Lab=Lab collect   03/03/21 0803            Signed, Terrilee Croak, MD [facility] 03/03/2021

## 2021-03-18 ENCOUNTER — Other Ambulatory Visit: Payer: Self-pay

## 2021-03-18 ENCOUNTER — Encounter: Payer: Self-pay | Admitting: Intensive Care

## 2021-03-18 ENCOUNTER — Emergency Department: Payer: Medicare Other

## 2021-03-18 ENCOUNTER — Observation Stay
Admission: EM | Admit: 2021-03-18 | Discharge: 2021-03-21 | Disposition: A | Discharge status: Home / Self Care | Payer: Medicare Other | Attending: Student | Admitting: Student

## 2021-03-18 DIAGNOSIS — N179 Acute kidney failure, unspecified: Secondary | ICD-10-CM | POA: Diagnosis not present

## 2021-03-18 DIAGNOSIS — N184 Chronic kidney disease, stage 4 (severe): Secondary | ICD-10-CM | POA: Diagnosis not present

## 2021-03-18 DIAGNOSIS — D649 Anemia, unspecified: Secondary | ICD-10-CM | POA: Diagnosis not present

## 2021-03-18 DIAGNOSIS — Z7982 Long term (current) use of aspirin: Secondary | ICD-10-CM | POA: Diagnosis not present

## 2021-03-18 DIAGNOSIS — I129 Hypertensive chronic kidney disease with stage 1 through stage 4 chronic kidney disease, or unspecified chronic kidney disease: Secondary | ICD-10-CM | POA: Insufficient documentation

## 2021-03-18 DIAGNOSIS — Z8616 Personal history of COVID-19: Secondary | ICD-10-CM | POA: Diagnosis not present

## 2021-03-18 DIAGNOSIS — Z20822 Contact with and (suspected) exposure to covid-19: Secondary | ICD-10-CM | POA: Diagnosis not present

## 2021-03-18 DIAGNOSIS — I10 Essential (primary) hypertension: Secondary | ICD-10-CM | POA: Diagnosis present

## 2021-03-18 DIAGNOSIS — F17210 Nicotine dependence, cigarettes, uncomplicated: Secondary | ICD-10-CM | POA: Diagnosis not present

## 2021-03-18 DIAGNOSIS — R3914 Feeling of incomplete bladder emptying: Secondary | ICD-10-CM | POA: Diagnosis present

## 2021-03-18 DIAGNOSIS — Z79899 Other long term (current) drug therapy: Secondary | ICD-10-CM | POA: Insufficient documentation

## 2021-03-18 DIAGNOSIS — J449 Chronic obstructive pulmonary disease, unspecified: Secondary | ICD-10-CM | POA: Insufficient documentation

## 2021-03-18 DIAGNOSIS — N401 Enlarged prostate with lower urinary tract symptoms: Secondary | ICD-10-CM | POA: Diagnosis present

## 2021-03-18 DIAGNOSIS — R531 Weakness: Secondary | ICD-10-CM | POA: Diagnosis present

## 2021-03-18 DIAGNOSIS — I2510 Atherosclerotic heart disease of native coronary artery without angina pectoris: Secondary | ICD-10-CM | POA: Diagnosis not present

## 2021-03-18 LAB — PROTIME-INR
INR: 1.1 (ref 0.8–1.2)
Prothrombin Time: 14.1 seconds (ref 11.4–15.2)

## 2021-03-18 LAB — RETICULOCYTES
Immature Retic Fract: 17.5 % — ABNORMAL HIGH (ref 2.3–15.9)
RBC.: 2.7 MIL/uL — ABNORMAL LOW (ref 4.22–5.81)
Retic Count, Absolute: 34.8 10*3/uL (ref 19.0–186.0)
Retic Ct Pct: 1.3 % (ref 0.4–3.1)

## 2021-03-18 LAB — CBC WITH DIFFERENTIAL/PLATELET
Abs Immature Granulocytes: 0.03 10*3/uL (ref 0.00–0.07)
Basophils Absolute: 0 10*3/uL (ref 0.0–0.1)
Basophils Relative: 0 %
Eosinophils Absolute: 0.1 10*3/uL (ref 0.0–0.5)
Eosinophils Relative: 2 %
HCT: 22.9 % — ABNORMAL LOW (ref 39.0–52.0)
Hemoglobin: 7.6 g/dL — ABNORMAL LOW (ref 13.0–17.0)
Immature Granulocytes: 0 %
Lymphocytes Relative: 14 %
Lymphs Abs: 1 10*3/uL (ref 0.7–4.0)
MCH: 32.6 pg (ref 26.0–34.0)
MCHC: 33.2 g/dL (ref 30.0–36.0)
MCV: 98.3 fL (ref 80.0–100.0)
Monocytes Absolute: 0.5 10*3/uL (ref 0.1–1.0)
Monocytes Relative: 8 %
Neutro Abs: 5.4 10*3/uL (ref 1.7–7.7)
Neutrophils Relative %: 76 %
Platelets: 230 10*3/uL (ref 150–400)
RBC: 2.33 MIL/uL — ABNORMAL LOW (ref 4.22–5.81)
RDW: 13.2 % (ref 11.5–15.5)
WBC: 7.1 10*3/uL (ref 4.0–10.5)
nRBC: 0 % (ref 0.0–0.2)

## 2021-03-18 LAB — COMPREHENSIVE METABOLIC PANEL
ALT: 11 U/L (ref 0–44)
AST: 14 U/L — ABNORMAL LOW (ref 15–41)
Albumin: 2.2 g/dL — ABNORMAL LOW (ref 3.5–5.0)
Alkaline Phosphatase: 59 U/L (ref 38–126)
Anion gap: 8 (ref 5–15)
BUN: 64 mg/dL — ABNORMAL HIGH (ref 8–23)
CO2: 18 mmol/L — ABNORMAL LOW (ref 22–32)
Calcium: 8.8 mg/dL — ABNORMAL LOW (ref 8.9–10.3)
Chloride: 115 mmol/L — ABNORMAL HIGH (ref 98–111)
Creatinine, Ser: 3.52 mg/dL — ABNORMAL HIGH (ref 0.61–1.24)
GFR, Estimated: 16 mL/min — ABNORMAL LOW (ref >60)
Glucose, Bld: 110 mg/dL — ABNORMAL HIGH (ref 70–99)
Potassium: 4.4 mmol/L (ref 3.5–5.1)
Sodium: 141 mmol/L (ref 135–145)
Total Bilirubin: 0.4 mg/dL (ref 0.3–1.2)
Total Protein: 5.8 g/dL — ABNORMAL LOW (ref 6.5–8.1)

## 2021-03-18 LAB — URINALYSIS, COMPLETE (UACMP) WITH MICROSCOPIC
Bilirubin Urine: NEGATIVE
Glucose, UA: NEGATIVE mg/dL
Hgb urine dipstick: NEGATIVE
Ketones, ur: NEGATIVE mg/dL
Leukocytes,Ua: NEGATIVE
Nitrite: NEGATIVE
Protein, ur: NEGATIVE mg/dL
Specific Gravity, Urine: 1.014 (ref 1.005–1.030)
pH: 5 (ref 5.0–8.0)

## 2021-03-18 LAB — IRON AND TIBC
Iron: 27 ug/dL — ABNORMAL LOW (ref 45–182)
Saturation Ratios: 14 % — ABNORMAL LOW (ref 17.9–39.5)
TIBC: 189 ug/dL — ABNORMAL LOW (ref 250–450)
UIBC: 162 ug/dL

## 2021-03-18 LAB — VITAMIN B12: Vitamin B-12: 178 pg/mL — ABNORMAL LOW (ref 180–914)

## 2021-03-18 LAB — FERRITIN: Ferritin: 212 ng/mL (ref 24–336)

## 2021-03-18 LAB — FOLATE: Folate: 7.9 ng/mL (ref >5.9)

## 2021-03-18 MED ORDER — FINASTERIDE 5 MG PO TABS
5.0000 mg | ORAL_TABLET | Freq: Every day | ORAL | Status: DC
  Administered 2021-03-19 – 2021-03-21 (×3): 5 mg via ORAL
  Filled 2021-03-18 (×3): qty 1

## 2021-03-18 MED ORDER — PANTOPRAZOLE SODIUM 40 MG PO TBEC
40.0000 mg | DELAYED_RELEASE_TABLET | Freq: Every day | ORAL | Status: DC
Start: 2021-03-18 — End: 2021-03-21
  Administered 2021-03-18 – 2021-03-20 (×3): 40 mg via ORAL
  Filled 2021-03-18 (×4): qty 1

## 2021-03-18 MED ORDER — TAMSULOSIN HCL 0.4 MG PO CAPS
0.8000 mg | ORAL_CAPSULE | Freq: Every day | ORAL | Status: DC
  Administered 2021-03-19 – 2021-03-21 (×3): 0.8 mg via ORAL
  Filled 2021-03-18 (×3): qty 2

## 2021-03-18 MED ORDER — IPRATROPIUM-ALBUTEROL 0.5-2.5 (3) MG/3ML IN SOLN: 3.0000 mL | Freq: Four times a day (QID) | RESPIRATORY_TRACT | Status: DC | PRN

## 2021-03-18 MED ORDER — SODIUM CHLORIDE 0.9 % IV SOLN: Freq: Once | INTRAVENOUS | Status: AC

## 2021-03-18 MED ORDER — METOPROLOL TARTRATE 25 MG PO TABS
12.5000 mg | ORAL_TABLET | Freq: Two times a day (BID) | ORAL | Status: DC
  Administered 2021-03-18 – 2021-03-19 (×3): 12.5 mg via ORAL
  Filled 2021-03-18 (×3): qty 1

## 2021-03-18 MED ORDER — VITAMIN D 25 MCG (1000 UNIT) PO TABS
1000.0000 [IU] | ORAL_TABLET | Freq: Every day | ORAL | Status: DC
  Administered 2021-03-19 – 2021-03-21 (×3): 1000 [IU] via ORAL
  Filled 2021-03-18 (×3): qty 1

## 2021-03-18 MED ORDER — ACETAMINOPHEN 650 MG RE SUPP: 650.0000 mg | Freq: Four times a day (QID) | RECTAL | Status: AC | PRN

## 2021-03-18 MED ORDER — AMLODIPINE BESYLATE 10 MG PO TABS
10.0000 mg | ORAL_TABLET | Freq: Every day | ORAL | Status: DC
  Administered 2021-03-19: 10 mg via ORAL
  Filled 2021-03-18: qty 1

## 2021-03-18 MED ORDER — TRAZODONE HCL 50 MG PO TABS
150.0000 mg | ORAL_TABLET | Freq: Every day | ORAL | Status: DC
  Administered 2021-03-18 – 2021-03-20 (×3): 150 mg via ORAL
  Filled 2021-03-18 (×3): qty 3

## 2021-03-18 MED ORDER — ATORVASTATIN CALCIUM 20 MG PO TABS
80.0000 mg | ORAL_TABLET | Freq: Every day | ORAL | Status: DC
  Administered 2021-03-18 – 2021-03-20 (×3): 80 mg via ORAL
  Filled 2021-03-18 (×4): qty 4

## 2021-03-18 MED ORDER — ONDANSETRON HCL 4 MG/2ML IJ SOLN
4.0000 mg | Freq: Four times a day (QID) | INTRAMUSCULAR | Status: DC | PRN
Start: 2021-03-18 — End: 2021-03-21

## 2021-03-18 MED ORDER — ACETAMINOPHEN 325 MG PO TABS: 650.0000 mg | ORAL_TABLET | Freq: Four times a day (QID) | ORAL | Status: AC | PRN

## 2021-03-18 MED ORDER — CALCITRIOL 0.25 MCG PO CAPS
0.2500 ug | ORAL_CAPSULE | Freq: Every day | ORAL | Status: DC
  Administered 2021-03-19 – 2021-03-21 (×3): 0.25 ug via ORAL
  Filled 2021-03-18 (×3): qty 1

## 2021-03-18 MED ORDER — FERROUS SULFATE 325 (65 FE) MG PO TABS: 325.0000 mg | ORAL_TABLET | Freq: Two times a day (BID) | ORAL | Status: DC

## 2021-03-18 MED ORDER — ONDANSETRON HCL 4 MG PO TABS
4.0000 mg | ORAL_TABLET | Freq: Four times a day (QID) | ORAL | Status: DC | PRN
Start: 2021-03-18 — End: 2021-03-21

## 2021-03-18 MED ORDER — CITALOPRAM HYDROBROMIDE 20 MG PO TABS
20.0000 mg | ORAL_TABLET | Freq: Every day | ORAL | Status: DC
  Administered 2021-03-19 – 2021-03-21 (×3): 20 mg via ORAL
  Filled 2021-03-18 (×3): qty 1

## 2021-03-18 NOTE — H&P (Addendum)
History and Physical   Max Ellis DPO:[id number] DOB: [birth year] DOA: 03/18/2021  PCP: Maryland Pink, MD  Outpatient Specialists: Dr. Rosita Kea, urology Patient coming from: SNF  I have personally briefly reviewed patient's old medical records in [place].  Chief Concern: Weakness, abnormal labs  HPI: Max Ellis is a 87 y.o. male with medical history significant for history of COVID infection in July 2022, hypertension, BPH, CAD on Plavix, COPD, anemia of chronic disease, CKD stage 4, depression, secondary renal hyperparathyroidism, presents to the emergency department from facility for chief concerns of abnormal labs.  He endorses dysuria for two days. He denies dysphagia, fever, chest pain, shortness of breath, abdominal pain, diarrhea. At baseline he has constipation. Family brought stool softener to facility but facility never gave it to him. Daughter at bedside states that he finally had 1 bowel movement that was good on evening of 03/17/2021.    Social history: He lived at home with his wife prior. She passed away from liver cirrhosis about three months ago.  They were married for over 71 years.  He is a current daily smoker, 5 cigarettes per day now. At his peak, he was smoking about 1 ppd. He started smoking at age 60. He is retired and former Psychologist, sport and exercise and worked Dealer.   Vaccination history: unknown covid 19 vaccination history. Daugther doesn't even know.   ROS: Constitutional: no weight change, no fever ENT/Mouth: no sore throat, no rhinorrhea Eyes: no eye pain, no vision changes Cardiovascular: no chest pain, no dyspnea,  no edema, no palpitations Respiratory: no cough, no sputum, no wheezing Gastrointestinal: no nausea, no vomiting, no diarrhea, no constipation Genitourinary: no urinary incontinence, no dysuria, no hematuria Musculoskeletal: no arthralgias, no myalgias Skin: no skin lesions, no pruritus, Neuro: + weakness, no loss of  consciousness, no syncope Psych: no anxiety, no depression, + decrease appetite Heme/Lymph: no bruising, no bleeding  ED Course: Discussed with emergency medicine provider, patient requiring hospitalization for chief concerns of abnormal labs at facility.  Initial vitals in the emergency department was remarkable for temperature 97.7, respiration rate of 20, heart rate of 61, blood pressure 117/70 and improved to 132/45, SPO2 of 98% on room air.  Sodium level is 141, potassium 4.4, chloride 115, bicarb 18, BUN 64, serum creatinine of 3.52, nonfasting blood glucose 110, WBC 7.1, hemoglobin 7.6, platelets 230.  Emergency medicine provider ordered normal saline 100 mL/h.  Fecal occult was done by EDP who states it was negative  Assessment/Plan  Principal Problem:   Acute anemia Active Problems:   Essential hypertension   Benign prostatic hyperplasia with incomplete bladder emptying   Chronic kidney disease, stage 4 (severe) (HCC)   # Acute anemia-I suspect this is multifactorial including chronic disease in setting of recent hospitalization - In setting of patient with chronic thrombocytopenia-Baseline of 98-1 06 and currently platelets is 230 - No transfusion at this time, however patient may need a transfusion in the a.m. with pending labs - EDP ordered anemia panel which is pending at this time - EDP did fecal occult and it was negative - Continue monitoring - CBC in the a.m. - EKG ordered to assess for QTC - MedSurg, observation, telemetry ordered - Iron level is 27, TIBC 189, iron saturation is 14 - Patient states he can tolerate p.o. intake at this time, ferrous sulfate 325 mg p.o. twice daily with meals has been ordered for 5 days - If patient endorses GI upset versus intolerance of p.o.  iron tablets, I would recommend a.m. team to consider initiation of IV iron supplementation at that time - CBC in the a.m.  # Acute anemia-serum creatinine on presentation is 3.62 - His  baseline creatinine is 2.73-3.17, CKD 4 - EDP ordered sodium chloride infusion at 100 mL/h, for 1 dose - I encourage p.o. intake and given patient's history of CKD, I have not ordered more IV fluid - BMP in the a.m.  # Hypertension -patient is currently normotensive as patient has taken his a.m. antihypertensive medications are ready - Resumed home amlodipine 10 mg, metoprolol tartrate 12.5 mg twice daily  # Hyperlipidemia-atorvastatin 80 mg nightly  # Acute kidney injury on CKD 4-I suspect this is secondary to dehydration - Serum creatinine on presentation is 3.52, EGFR 16 - Serum creatinine range is 2.73-3.17  # Asymptomatic bradycardia-EKG on 02/27/2021 showed sinus bradycardia with rate of 53, QTc 437 - Patient is on beta-blockade - No further interventions indicated at this time  # CAD-I did not resume Plavix and aspirin due to the acute anemia  # BPH-resumed home dose of finasteride 5 mg daily, tamsulosin 0.8 mg p.o. daily  # History of COPD-resumed ipratropium-albuterol Respimat, 1 puff inhalation every 6 hours as needed for wheezing  # GERD-PPI resumed  # CODE STATUS-when I asked if patient wants chest compressions if his heart were to stop beating, he states no to let medical I want to join my honey.  When I asked if patient cannot breathe on his own can we intubate the patient put a tube down his throat who came up to machine to help breathe for him.  He states no just let me go. - Daughter, [name] at bedside who endorses that this has been this wish  # DVT prophylaxis-I have not ordered pharmacologic DVT prophylaxis due to acute on chronic anemia at this time - TED hose - A.m. team to initiate DVT prophylaxis when appropriate  # Med reconciliation complete # COVID PCR in process  # Dysthymia/grieving-his wife of 50+ years passed away 3 months ago # Unintentional weight loss of 20 pounds in the last 3 months # Complex grieving  # PT/OT ordered for resumption  # TOC  consulted-with notes that daughter [name] does not wish for patient to be discharged back to [facility]  Chart reviewed.   Complete echo on 02/17/2021: Estimated ejection fraction is 60 to 65%, left ventricular diastolic parameters were normal.  Right ventricular systolic function is normal.  DVT prophylaxis: TED hose Code Status: DNR Diet: Heart healthy Family Communication: Updated daughter, [name] at bedside Disposition Plan: Pending clinical course Consults called: None at this time Admission status: MedSurg, observation, telemetry  Past Medical History:  Diagnosis Date   COPD (chronic obstructive pulmonary disease) ([place])    Hypertension    Peripheral vascular disease ([place])    Renal disorder    Stage 4 chronic kidney disease ([place])    History reviewed. No pertinent surgical history.  Social History:  reports that he has been smoking cigarettes. He has quit using smokeless tobacco.  His smokeless tobacco use included chew. He reports that he does not drink alcohol and does not use drugs.  No Known Allergies History reviewed. No pertinent family history. Family history: Family history reviewed and not pertinent  Prior to Admission medications   Medication Sig Start Date End Date Taking? Authorizing Provider  amLODipine (NORVASC) 10 MG tablet Take 1 tablet (10 mg total) by mouth daily. 03/04/21   Terrilee Croak, MD  aspirin (ASPIRIN 81) 81 MG EC tablet Take 1 tablet (81 mg total) by mouth daily. 02/19/21   Patrecia Pour, MD  atorvastatin (LIPITOR) 80 MG tablet Take 1 tablet (80 mg total) by mouth daily. 02/20/21   Patrecia Pour, MD  calcitRIOL (ROCALTROL) 0.25 MCG capsule Take 0.25 mcg by mouth daily. 09/18/19   [provider]  cholecalciferol (VITAMIN D3) 25 MCG (1000 UT) tablet Take 1,000 Units by mouth daily.    [provider]  citalopram (CELEXA) 10 MG tablet Take 10 mg by mouth daily.    [provider]  clopidogrel (PLAVIX) 75 MG tablet  Take 1 tablet (75 mg total) by mouth daily. 02/20/21   Patrecia Pour, MD  famotidine (PEPCID) 20 MG tablet Take by mouth. 02/26/20 02/25/21  [provider]  finasteride (PROSCAR) 5 MG tablet Take 5 mg by mouth daily. 12/30/16   [provider]  Ipratropium-Albuterol (COMBIVENT) 20-100 MCG/ACT AERS respimat Inhale 1 puff into the lungs every 6 (six) hours as needed for wheezing. 03/03/21   Dahal, Marlowe Aschoff, MD  latanoprost (XALATAN) 0.005 % ophthalmic solution Place 1 drop into both eyes at bedtime. 02/15/20   [provider]  metoprolol tartrate (LOPRESSOR) 25 MG tablet Take 0.5 tablets (12.5 mg total) by mouth 2 (two) times daily. 03/03/21   Terrilee Croak, MD  omeprazole (PRILOSEC) 20 MG capsule Take 20 mg by mouth daily.    [provider]  tamsulosin (FLOMAX) 0.4 MG CAPS capsule Take 0.8 mg by mouth daily.    [provider]  traZODone (DESYREL) 50 MG tablet Take 150 mg by mouth at bedtime.    [provider]   Physical Exam: Vitals:   03/18/21 1201 03/18/21 1430 03/18/21 1530 03/18/21 1630  BP: 117/70 (!) 132/45 (!) 137/57 (!) 127/49  Pulse: 61 (!) 58 (!) 52 (!) 54  Resp: 20  16 12   Temp: 97.7 F (36.5 C)     TempSrc: Oral     SpO2: 98% 100% 100% 100%  Weight:      Height:       Constitutional: appears age-appropriate, frail, cachectic, NAD, calm, comfortable Eyes: PERRL, lids and conjunctivae normal H ENMT: Bilateral temporal wasting, mucous membranes are dry. Posterior pharynx clear of any exudate or lesions. Age-appropriate dentition. Hearing appropriate Neck: normal, supple, no masses, no thyromegaly Respiratory: clear to auscultation bilaterally, no wheezing, no crackles. Normal respiratory effort. No accessory muscle use.  Cardiovascular: Regular rate and rhythm, no murmurs / rubs / gallops. No extremity edema. 2+ pedal pulses. No carotid bruits.  Abdomen: no tenderness, no masses palpated, no hepatosplenomegaly. Bowel sounds positive.   Musculoskeletal: no clubbing / cyanosis. No joint deformity upper and lower extremities. Good ROM, no contractures, no atrophy. Normal muscle tone.  Skin: no rashes, lesions, ulcers. No induration Neurologic: Sensation intact. Strength 5/5 in all 4.  Psychiatric: Normal judgment and insight. Alert and oriented x 3. Normal mood.   EKG: independently reviewed, showing; ordered sinus bradycardia with rate of 53, QTc 472  Chest x-ray on Admission: Not indicated at this time  Labs on Admission: I have personally reviewed following labs  CBC: Recent Labs  Lab 03/18/21 1202  WBC 7.1  NEUTROABS 5.4  HGB 7.6*  HCT 22.9*  MCV 98.3  PLT 182   Basic Metabolic Panel: Recent Labs  Lab 03/18/21 1202  NA 141  K 4.4  CL 115*  CO2 18*  GLUCOSE 110*  BUN 64*  CREATININE 3.52*  CALCIUM 8.8*  GFR: Estimated Creatinine Clearance: 14.3 mL/min (A) (by C-G formula based on SCr of 3.52 mg/dL (H)).  Liver Function Tests: Recent Labs  Lab 03/18/21 1202  AST 14*  ALT 11  ALKPHOS 59  BILITOT 0.4  PROT 5.8*  ALBUMIN 2.2*   Urine analysis:    Component Value Date/Time   COLORURINE YELLOW (A) 03/18/2021 1638   APPEARANCEUR CLEAR (A) 03/18/2021 1638   APPEARANCEUR Clear 01/02/2021 1358   LABSPEC 1.014 03/18/2021 1638   PHURINE 5.0 03/18/2021 [place] 03/18/2021 1638   [place] 03/18/2021 [place] 03/18/2021 1638   BILIRUBINUR Negative 01/02/2021 [place] 03/18/2021 1638   PROTEINUR NEGATIVE 03/18/2021 1638   NITRITE NEGATIVE 03/18/2021 1638   LEUKOCYTESUR NEGATIVE 03/18/2021 1638   Dr. Tobie Poet [facility]  If 7PM-7AM, please contact overnight-coverage provider If 7AM-7PM, please contact day coverage provider [email or web address]  03/18/2021, 5:35 PM

## 2021-03-18 NOTE — ED Triage Notes (Signed)
Patient sent to ER for abnormal labs. Facility reported HBG 6.8. Patient denies pain. Denies sob and cp

## 2021-03-18 NOTE — ED Triage Notes (Signed)
Pt in via EMS from [facility] for abnormal labs. Hbg 6.8 per facility and pt needs a transfusion. Pt denies pain or symptoms.

## 2021-03-18 NOTE — ED Provider Notes (Signed)
[facility] Emergency Department Provider Note  ____________________________________________   None    (approximate)  I have reviewed the triage vital signs and the nursing notes.   HISTORY  Chief Complaint Abnormal Lab    HPI Max Ellis is a 87 y.o. male  with PMHx COPD, HTN, PVD, CKD, here with generalized weakness, abnormal lab. Pt was just hospitalized for COVID, has been at rehab since then. Reportedly, pt had outside labs drawn today which showed worsening CKD, anemia and was told to come for evaluation. Reports he has been feeling fine though pt reportedly "minimizes" everything. According to family, pt has seemed increasingly weak and has not been eating/drinking much. He's had persistent cough since his hospitalization and seems to have gotten worse recently. Denies any lightheadedness or dizziness. Denies any CP or SOB to me.        Past Medical History:  Diagnosis Date   COPD (chronic obstructive pulmonary disease) ([place])    Hypertension    Peripheral vascular disease ([place])    Renal disorder    Stage 4 chronic kidney disease ([place])     Patient Active Problem List   Diagnosis Date Noted   Acute anemia 03/18/2021   Fall 02/19/2021   Acute lower UTI 02/19/2021   COVID-19 virus infection 02/18/2021   NSTEMI (non-ST elevated myocardial infarction) ([place]) 02/17/2021   Hypertensive urgency 02/16/2021   Acute urinary retention 02/16/2021   Chronic kidney disease, stage 4 (severe) ([place]) 02/16/2021   Benign prostatic hyperplasia with incomplete bladder emptying 03/03/2020   Essential hypertension 01/26/2017   Tobacco use disorder 01/26/2017   Pain in limb 01/26/2017    History reviewed. No pertinent surgical history.  Prior to Admission medications   Medication Sig Start Date End Date Taking? Authorizing Provider  amLODipine (NORVASC) 10 MG tablet Take 1 tablet (10 mg total) by mouth daily. 03/04/21  Yes Dahal, Marlowe Aschoff, MD  aspirin (ASPIRIN  81) 81 MG EC tablet Take 1 tablet (81 mg total) by mouth daily. 02/19/21  Yes Patrecia Pour, MD  atorvastatin (LIPITOR) 80 MG tablet Take 1 tablet (80 mg total) by mouth daily. 02/20/21  Yes Patrecia Pour, MD  calcitRIOL (ROCALTROL) 0.25 MCG capsule Take 0.25 mcg by mouth daily. 09/18/19  Yes [provider]  cholecalciferol (VITAMIN D3) 25 MCG (1000 UT) tablet Take 1,000 Units by mouth daily.   Yes [provider]  citalopram (CELEXA) 20 MG tablet Take 20 mg by mouth daily.   Yes [provider]  clopidogrel (PLAVIX) 75 MG tablet Take 1 tablet (75 mg total) by mouth daily. 02/20/21  Yes Patrecia Pour, MD  famotidine (PEPCID) 20 MG tablet Take 20 mg by mouth daily. 02/26/20 03/18/21 Yes [provider]  finasteride (PROSCAR) 5 MG tablet Take 5 mg by mouth daily. 12/30/16  Yes [provider]  Ipratropium-Albuterol (COMBIVENT) 20-100 MCG/ACT AERS respimat Inhale 1 puff into the lungs every 6 (six) hours as needed for wheezing. 03/03/21  Yes Dahal, Marlowe Aschoff, MD  latanoprost (XALATAN) 0.005 % ophthalmic solution Place 1 drop into both eyes at bedtime. 02/15/20  Yes [provider]  metoprolol tartrate (LOPRESSOR) 25 MG tablet Take 0.5 tablets (12.5 mg total) by mouth 2 (two) times daily. 03/03/21  Yes Dahal, Marlowe Aschoff, MD  omeprazole (PRILOSEC) 20 MG capsule Take 20 mg by mouth at bedtime.   Yes [provider]  tamsulosin (FLOMAX) 0.4 MG CAPS capsule Take 0.8 mg by mouth daily.   Yes [provider]  traZODone (DESYREL) 150 MG tablet Take 150 mg by mouth at bedtime.   Yes [provider]    Allergies Patient has no known allergies.  History reviewed. No pertinent family history.  Social History Social History   Tobacco Use   Smoking status: Every Day    Types: Cigarettes   Smokeless tobacco: Former    Types: Chew  Substance Use Topics   Alcohol use: No   Drug use: No    Review of Systems  Review of Systems   Constitutional:  Positive for fatigue. Negative for chills and fever.  HENT:  Negative for sore throat.   Respiratory:  Positive for cough. Negative for shortness of breath.   Cardiovascular:  Negative for chest pain.  Gastrointestinal:  Negative for abdominal pain.  Genitourinary:  Negative for flank pain.  Musculoskeletal:  Negative for neck pain.  Skin:  Negative for rash and wound.  Allergic/Immunologic: Negative for immunocompromised state.  Neurological:  Negative for weakness and numbness.  Hematological:  Does not bruise/bleed easily.  All other systems reviewed and are negative.   ____________________________________________  PHYSICAL EXAM:      VITAL SIGNS: ED Triage Vitals  Enc Vitals Group     BP 03/18/21 1201 117/70     Pulse Rate 03/18/21 1201 61     Resp 03/18/21 1201 20     Temp 03/18/21 1201 97.7 F (36.5 C)     Temp Source 03/18/21 1201 Oral     SpO2 03/18/21 1201 98 %     Weight 03/18/21 1158 145 lb (65.8 kg)     Height 03/18/21 1158 '6\' 2"'$  (1.88 m)     Head Circumference --      Peak Flow --      Pain Score 03/18/21 1158 0     Pain Loc --      Pain Edu? --      Excl. in [place]? --      Physical Exam    ____________________________________________   LABS (all labs ordered are listed, but only abnormal results are displayed)  Labs Reviewed  CBC WITH DIFFERENTIAL/PLATELET - Abnormal; Notable for the following components:      Result Value   RBC 2.33 (*)    Hemoglobin 7.6 (*)    HCT 22.9 (*)    All other components within normal limits  COMPREHENSIVE METABOLIC PANEL - Abnormal; Notable for the following components:   Chloride 115 (*)    CO2 18 (*)    Glucose, Bld 110 (*)    BUN 64 (*)    Creatinine, Ser 3.52 (*)    Calcium 8.8 (*)    Total Protein 5.8 (*)    Albumin 2.2 (*)    AST 14 (*)    GFR, Estimated 16 (*)    All other components within normal limits  IRON AND TIBC - Abnormal; Notable for the following components:   Iron 27 (*)     TIBC 189 (*)    Saturation Ratios 14 (*)    All other components within normal limits  RETICULOCYTES - Abnormal; Notable for the following components:   RBC. 2.70 (*)    Immature Retic Fract 17.5 (*)    All other components within normal limits  URINALYSIS, COMPLETE (UACMP) WITH MICROSCOPIC - Abnormal; Notable for the following components:   Color, Urine YELLOW (*)    APPearance CLEAR (*)    Bacteria, UA RARE (*)    All other components within normal limits  SARS  CORONAVIRUS 2 (TAT 6-24 HRS)  PROTIME-INR  FOLATE  FERRITIN  VITAMIN B12  OCCULT BLOOD X 1 CARD TO LAB, STOOL  BASIC METABOLIC PANEL  CBC  TYPE AND SCREEN    ____________________________________________  EKG:  ________________________________________  RADIOLOGY All imaging, including plain films, CT scans, and ultrasounds, independently reviewed by me, and interpretations confirmed via formal radiology reads.  ED MD interpretation:   CXR: Bibasilar atelectasis w/ small effusions  Official radiology report(s): DG Chest 2 View  Result Date: 03/18/2021 CLINICAL DATA:  Weakness and shortness of breath. EXAM: CHEST - 2 VIEW COMPARISON:  02/19/2021 FINDINGS: The cardio pericardial silhouette is enlarged. Bibasilar atelectasis/infiltrate noted with small bilateral pleural effusions. Interstitial markings are diffusely coarsened with chronic features. Bones are diffusely demineralized. IMPRESSION: Bibasilar atelectasis/infiltrate with small bilateral pleural effusions. Electronically Signed   By: Misty Stanley M.D.   On: 03/18/2021 16:02    ____________________________________________  PROCEDURES   Procedure(s) performed (including Critical Care):  Procedures  ____________________________________________  INITIAL IMPRESSION / MDM / [place] / ED COURSE  As part of my medical decision making, I reviewed the following data within the [place] notes reviewed and incorporated, Old  chart reviewed, Notes from prior ED visits, and [place] Controlled Substance Database       *Max Ellis was evaluated in Emergency Department on 03/18/2021 for the symptoms described in the history of present illness. He was evaluated in the context of the global COVID-19 pandemic, which necessitated consideration that the patient might be at risk for infection with the SARS-CoV-2 virus that causes COVID-19. Institutional protocols and algorithms that pertain to the evaluation of patients at risk for COVID-19 are in a state of rapid change based on information released by regulatory bodies including the CDC and federal and state organizations. These policies and algorithms were followed during the patient's care in the ED.  Some ED evaluations and interventions may be delayed as a result of limited staffing during the pandemic.*     Medical Decision Making:  86 yo M here with worsening generalized weakness in setting of recent COVID hospitalization and admission. Labs show worsening AKI, likely from poor PO intake, as well as worsening anemia likely related to ACD as well as worsening CKD. Pt is dehydrated clinically but nontoxic. Will start cautious fluids, type and screen. Hgb <7 on outsidel abs - suspect pt will need transfusion once hydrated as his CBC appears hemoconcentrated at this time. He is o/w nontoxic, well appearing.  ____________________________________________  FINAL CLINICAL IMPRESSION(S) / ED DIAGNOSES  Final diagnoses:  Acute renal failure superimposed on stage 4 chronic kidney disease, unspecified acute renal failure type (HCC)  Acute on chronic anemia     MEDICATIONS GIVEN DURING THIS VISIT:  Medications  atorvastatin (LIPITOR) tablet 80 mg (has no administration in time range)  traZODone (DESYREL) tablet 150 mg (has no administration in time range)  Ipratropium-Albuterol (COMBIVENT) respimat 1 puff (has no administration in time range)  acetaminophen (TYLENOL) tablet 650  mg (has no administration in time range)    Or  acetaminophen (TYLENOL) suppository 650 mg (has no administration in time range)  ondansetron (ZOFRAN) tablet 4 mg (has no administration in time range)    Or  ondansetron (ZOFRAN) injection 4 mg (has no administration in time range)  amLODipine (NORVASC) tablet 10 mg (has no administration in time range)  metoprolol tartrate (LOPRESSOR) tablet 12.5 mg (has no administration in time range)  citalopram (CELEXA) tablet 20 mg (has  no administration in time range)  calcitRIOL (ROCALTROL) capsule 0.25 mcg (has no administration in time range)  pantoprazole (PROTONIX) EC tablet 40 mg (has no administration in time range)  finasteride (PROSCAR) tablet 5 mg (has no administration in time range)  tamsulosin (FLOMAX) capsule 0.8 mg (has no administration in time range)  cholecalciferol (VITAMIN D3) tablet 1,000 Units (has no administration in time range)  ferrous sulfate tablet 325 mg (has no administration in time range)  0.9 %  sodium chloride infusion ( Intravenous New Bag/Given 03/18/21 1615)     ED Discharge Orders     None        Note:  This document was prepared using Dragon voice recognition software and may include unintentional dictation errors.   Duffy Bruce, MD 03/18/21 [phone]

## 2021-03-18 NOTE — ED Notes (Signed)
BBID: [id number]    Applied to this pt's L wrist at this time, beside pt identification label.

## 2021-03-19 DIAGNOSIS — D649 Anemia, unspecified: Secondary | ICD-10-CM | POA: Diagnosis not present

## 2021-03-19 LAB — CBC
HCT: 20 % — ABNORMAL LOW (ref 39.0–52.0)
Hemoglobin: 6.5 g/dL — ABNORMAL LOW (ref 13.0–17.0)
MCH: 31.7 pg (ref 26.0–34.0)
MCHC: 32.5 g/dL (ref 30.0–36.0)
MCV: 97.6 fL (ref 80.0–100.0)
Platelets: 192 10*3/uL (ref 150–400)
RBC: 2.05 MIL/uL — ABNORMAL LOW (ref 4.22–5.81)
RDW: 13.2 % (ref 11.5–15.5)
WBC: 5 10*3/uL (ref 4.0–10.5)
nRBC: 0 % (ref 0.0–0.2)

## 2021-03-19 LAB — BASIC METABOLIC PANEL
Anion gap: 6 (ref 5–15)
BUN: 63 mg/dL — ABNORMAL HIGH (ref 8–23)
CO2: 20 mmol/L — ABNORMAL LOW (ref 22–32)
Calcium: 8.3 mg/dL — ABNORMAL LOW (ref 8.9–10.3)
Chloride: 118 mmol/L — ABNORMAL HIGH (ref 98–111)
Creatinine, Ser: 3.18 mg/dL — ABNORMAL HIGH (ref 0.61–1.24)
GFR, Estimated: 18 mL/min — ABNORMAL LOW (ref >60)
Glucose, Bld: 78 mg/dL (ref 70–99)
Potassium: 4.5 mmol/L (ref 3.5–5.1)
Sodium: 144 mmol/L (ref 135–145)

## 2021-03-19 LAB — VITAMIN D 25 HYDROXY (VIT D DEFICIENCY, FRACTURES): Vit D, 25-Hydroxy: 36.23 ng/mL (ref 30–100)

## 2021-03-19 LAB — SARS CORONAVIRUS 2 (TAT 6-24 HRS): SARS Coronavirus 2: NEGATIVE

## 2021-03-19 LAB — ABO/RH: ABO/RH(D): A POS

## 2021-03-19 LAB — PREPARE RBC (CROSSMATCH)

## 2021-03-19 MED ORDER — BISACODYL 5 MG PO TBEC
10.0000 mg | DELAYED_RELEASE_TABLET | Freq: Every day | ORAL | Status: DC | PRN
  Administered 2021-03-20: 10 mg via ORAL
  Filled 2021-03-19: qty 2

## 2021-03-19 MED ORDER — BISACODYL 10 MG RE SUPP: 10.0000 mg | Freq: Every day | RECTAL | Status: DC | PRN

## 2021-03-19 MED ORDER — CYANOCOBALAMIN 1000 MCG/ML IJ SOLN
1000.0000 ug | Freq: Every day | INTRAMUSCULAR | Status: AC
  Administered 2021-03-19 – 2021-03-21 (×3): 1000 ug via INTRAMUSCULAR
  Filled 2021-03-19 (×3): qty 1

## 2021-03-19 MED ORDER — DARBEPOETIN ALFA 40 MCG/0.4ML IJ SOSY
25.0000 ug | PREFILLED_SYRINGE | Freq: Once | INTRAMUSCULAR | Status: AC
  Administered 2021-03-19: 25 ug via SUBCUTANEOUS
  Filled 2021-03-19: qty 0.4

## 2021-03-19 MED ORDER — SODIUM CHLORIDE 0.9 % IV SOLN
300.0000 mg | INTRAVENOUS | Status: AC
  Administered 2021-03-19 – 2021-03-20 (×2): 300 mg via INTRAVENOUS
  Filled 2021-03-19 (×2): qty 15

## 2021-03-19 MED ORDER — FERROUS SULFATE 325 (65 FE) MG PO TABS
325.0000 mg | ORAL_TABLET | Freq: Two times a day (BID) | ORAL | Status: DC
  Administered 2021-03-21: 09:00:00 325 mg via ORAL
  Filled 2021-03-19: qty 1

## 2021-03-19 MED ORDER — SODIUM CHLORIDE 0.9% IV SOLUTION: Freq: Once | INTRAVENOUS | Status: AC

## 2021-03-19 MED ORDER — POLYETHYLENE GLYCOL 3350 17 G PO PACK
17.0000 g | PACK | Freq: Every day | ORAL | Status: DC
  Administered 2021-03-19 – 2021-03-21 (×3): 17 g via ORAL
  Filled 2021-03-19 (×3): qty 1

## 2021-03-19 MED ORDER — SODIUM BICARBONATE 650 MG PO TABS
650.0000 mg | ORAL_TABLET | Freq: Three times a day (TID) | ORAL | Status: DC
  Administered 2021-03-19 – 2021-03-21 (×7): 650 mg via ORAL
  Filled 2021-03-19 (×9): qty 1

## 2021-03-19 NOTE — Evaluation (Signed)
Physical Therapy Evaluation Patient Details Name: Max Ellis MRN: [id number] DOB: 10/21/1933 Today's Date: 03/19/2021   History of Present Illness  Pt is an 87 y/o M with PMH: COPD, HTN, PVD, CKD stage IV, recent NSTEMI, recent COVID infection (July 2022). Pt and family declined rehab initially, but brought pt back after fall and then pt d/c'ed to STR where he was for ~2 wks. BIB EMS from [facility] d/t abnormal labs including Hgb of 6.8.  Clinical Impression  Pt did well with mobility and ambulation.  He apparently does not typically need/use an AD but today was clearly reliant on it during 150 ft of ambulation (brief attempt w/o AD was staggering and unsafe).  Daughter present during exam and confirms that he is usually independent but that they do check in on him regularly and that they could have someone around much more regularly if needed.  Pt's vitals appropriate and stable t/o the gait training effort and overall pt showed reasonable mobility and function that he does not need STR at discharge.      Follow Up Recommendations Home health PT;Supervision - Intermittent    Equipment Recommendations  Rolling walker with 5" wheels (daughter reports walker at home has no wheels)    Recommendations for Other Services       Precautions / Restrictions Precautions Precautions: Fall Restrictions Weight Bearing Restrictions: No      Mobility  Bed Mobility               General bed mobility comments: in recliner on arrival    Transfers Overall transfer level: Needs assistance Equipment used: Rolling walker (2 wheeled) Transfers: Sit to/from Stand Sit to Stand: Supervision         General transfer comment: Pt able to rise from recliner w/o hesitation, good confidence holding walker  Ambulation/Gait Ambulation/Gait assistance: Min guard Gait Distance (Feet): 150 Feet Assistive device: Rolling walker (2 wheeled)       General Gait Details: Pt able to confidently ambulate  into the hallway with relatively consistent cadence.  Vitals stable on room air t/o the effort (O2 high 90s, HR ~60).  He was reliant on UEs/AD, slower but safe with single UE rail use, however clearly unsteady during breif attempt w/o UE use (which is apparently his baseline)  Science writer    Modified Rankin (Stroke Patients Only)       Balance Overall balance assessment: Needs assistance Sitting-balance support: No upper extremity supported;Feet supported Sitting balance-Leahy Scale: Good     Standing balance support: Bilateral upper extremity supported Standing balance-Leahy Scale: Fair Standing balance comment: good balance w/ UEs, poor w/o                             Pertinent Vitals/Pain Pain Assessment: No/denies pain    Home Living Family/patient expects to be discharged to:: Private residence Living Arrangements: Alone Available Help at Discharge: Family;Available PRN/intermittently Type of Home: House Home Access: Ramped entrance     Home Layout: One level Home Equipment: Walker - standard;Bedside commode Additional Comments: states he does not use any DME at home, per daughter walker at home does not have any wheels (prior documentation states he does)    Prior Function Level of Independence: Needs assistance   Gait / Transfers Assistance Needed: pt states he does not use AD for fxl mobility and is out of the home  QD.  Used RW for fxl mobility while at rehab and use of w/c for further distances such as outside to smoke. Pt with recent adm d/t fall (out of bed) at home  ADL's / Homemaking Assistance Needed: Pt reports being able to perform basic ADLs. Pt reports family (daughter and grandson-William) usually assist with IADLs such as cooking and cleaning as well as getting groceries and transportation.  Family is able to check in regularly        Hand Dominance        Extremity/Trunk Assessment   Upper Extremity  Assessment Upper Extremity Assessment: Generalized weakness;Overall [facility] for tasks assessed    Lower Extremity Assessment Lower Extremity Assessment: Generalized weakness;Overall WFL for tasks assessed       Communication   Communication: No difficulties  Cognition Arousal/Alertness: Awake/alert Behavior During Therapy: WFL for tasks assessed/performed Overall Cognitive Status: Within Functional Limits for tasks assessed                                 General Comments: Oriented to person and place ("hospital" but not which hospital). Oriented to year, but not month or day/date. not entirely oriented to situation, but endorses recent hospital admissions and that his spouse passed recently.      General Comments General comments (skin integrity, edema, etc.): HGB 6.5 this AM, has recieved blood and iron transfusions    Exercises     Assessment/Plan    PT Assessment Patient needs continued PT services  PT Problem List Decreased strength;Decreased mobility;Decreased safety awareness;Decreased activity tolerance;Cardiopulmonary status limiting activity;Decreased knowledge of use of DME;Decreased balance;Decreased cognition       PT Treatment Interventions DME instruction;Therapeutic activities;Cognitive remediation;Modalities;Gait training;Therapeutic exercise;Patient/family education;Stair training;Balance training;Functional mobility training;Neuromuscular re-education;Manual techniques    PT Goals (Current goals can be found in the Care Plan section)  Acute Rehab PT Goals Patient Stated Goal: get stronger, stay out of the hospital PT Goal Formulation: With patient Time For Goal Achievement: 04/02/21 Potential to Achieve Goals: Fair    Frequency Min 2X/week   Barriers to discharge        Co-evaluation               AM-PAC PT "6 Clicks" Mobility  Outcome Measure Help needed turning from your back to your side while in a flat bed without using  bedrails?: None Help needed moving from lying on your back to sitting on the side of a flat bed without using bedrails?: None Help needed moving to and from a bed to a chair (including a wheelchair)?: None Help needed standing up from a chair using your arms (e.g., wheelchair or bedside chair)?: None Help needed to walk in hospital room?: A Little Help needed climbing 3-5 steps with a railing? : A Little 6 Click Score: 22    End of Session Equipment Utilized During Treatment: Gait belt Activity Tolerance: Patient tolerated treatment well Patient left: in chair;with call bell/phone within reach;with family/visitor present Nurse Communication: Mobility status PT Visit Diagnosis: Muscle weakness (generalized) (M62.81);Difficulty in walking, not elsewhere classified (R26.2);Unsteadiness on feet (R26.81)    Time: [id number] PT Time Calculation (min) (ACUTE ONLY): 35 min   Charges:   PT Evaluation $PT Eval Low Complexity: 1 Low PT Treatments $Gait Training: 8-22 mins        Kreg Shropshire, DPT 03/19/2021, 5:05 PM

## 2021-03-19 NOTE — Progress Notes (Signed)
SLP Follow Up Note  Patient Details Name: ARK NAZARENO MRN: [id number] DOB: 02/09/1934   Pt downgraded pt's diet to dysphagia 3 d/t dentures are unavailable at this time.   Larra Crunkleton B. Rutherford Nail M.S., CCC-SLP, [place] Office [phone]   Stormy Fabian 03/19/2021, 12:46 PM

## 2021-03-19 NOTE — Evaluation (Signed)
Occupational Therapy Evaluation Patient Details Name: Max Ellis MRN: [id number] DOB: Feb 04, 1934 Today's Date: 03/19/2021    History of Present Illness Pt is an 87 y/o M with PMH: COPD, HTN, PVD, CKD stage IV, recent NSTEMI, recent COVID infection (July 2022). Pt and family declined rehab initially, but brought pt back after fall and then pt d/c'ed to STR where he was for ~2 wks. BIB EMS from [facility] d/t abnormal labs including Hgb of 6.8.   Clinical Impression   Pt seen for OT evaluation this date in setting of acute hospitalization d/t acute anemia. Pt reports that at baseline, he lives home alone and uses no AD for fxl mobility. Pt's grandson, Max Ellis, via telephone, reports that pt has been in STR at [facility] for ~2 wks d/t recent fall. Pt presents this date with decreased activity tolerance, balance and strength. On OT assessment, Pt requires: SETUP for seated UB ADLs, MIN A for seated LB ADLs, MOD A for standing LB ADLs including standing peri care/LB bathing with RW for UE support/balance. CGA for standing, MIN A for transfers. Will continue to follow acutely. Anticipate pt will require continuation of f/u OT services in STR setting upon d/c from hospital in order to rebuild strength for eventual return to home.    Follow Up Recommendations  SNF    Equipment Recommendations  None recommended by OT    Recommendations for Other Services       Precautions / Restrictions Precautions Precautions: Fall Restrictions Weight Bearing Restrictions: No      Mobility Bed Mobility Overal bed mobility: Needs Assistance Bed Mobility: Supine to Sit     Supine to sit: Supervision;HOB elevated     General bed mobility comments: increased time, use of rails    Transfers Overall transfer level: Needs assistance Equipment used: Rolling walker (2 wheeled) Transfers: Sit to/from Stand Sit to Stand: Min guard;Min assist Stand pivot transfers: Min guard;Min assist       General transfer  comment: incrased time, EOB elevated ~1-2"    Balance Overall balance assessment: Needs assistance Sitting-balance support: No upper extremity supported;Feet supported Sitting balance-Leahy Scale: Good Sitting balance - Comments: G static sitting   Standing balance support: Bilateral upper extremity supported Standing balance-Leahy Scale: Fair Standing balance comment: can alternate UE support to engage in static standing ADLs. B UE to weight shift and pivot.                           ADL either performed or assessed with clinical judgement   ADL                                         General ADL Comments: pt requires SETUP for seated UB ADLs, MIN A for seated LB ADLs, MOD A for standing LB ADLs including standing peri care/LB bathing with RW for UE support/balance. CGA for standing, MIN A for transfers.     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     Perception     Praxis      Pertinent Vitals/Pain Pain Assessment: No/denies pain     Hand Dominance Left   Extremity/Trunk Assessment Upper Extremity Assessment Upper Extremity Assessment: Generalized weakness   Lower Extremity Assessment Lower Extremity Assessment: Generalized weakness   Cervical /  Trunk Assessment Cervical / Trunk Assessment: Kyphotic   Communication Communication Communication: No difficulties   Cognition Arousal/Alertness: Awake/alert Behavior During Therapy: WFL for tasks assessed/performed Overall Cognitive Status: No family/caregiver present to determine baseline cognitive functioning                                 General Comments: Oriented to person and place ("hospital" but not which hospital). Oriented to year, but not month or day/date. not entirely oriented to situation, but endorses recent hospital admissions and that  his spouse passed recently.   General Comments       Exercises Other Exercises Other Exercises: OT ed re: role of OT, importance of OOB Activity, safety considerations including use of call light, safe use of RW.   Shoulder Instructions      Home Living Family/patient expects to be discharged to:: Private residence Living Arrangements: Alone Available Help at Discharge: Family;Available PRN/intermittently Type of Home: House Home Access: Ramped entrance     Home Layout: One level               Home Equipment: Walker - 2 wheels;Walker - 4 wheels;Bedside commode;Shower seat   Additional Comments: states he does not use any DME at home      Prior Functioning/Environment    Gait / Transfers Assistance Needed: pt states he does not use AD for fxl mobility typically. Pt's grandson via telephone does report use of RW for fxl mobility while pt in rehab and use of w/c for further distances such as outside to smoke. Pt with recent adm d/t fall at home ADL's / Homemaking Assistance Needed: Pt reports being able to perform basic ADLs. Pt reports family (daughter and grandson-Max Ellis) usually assist with IADLs such as cooking and cleaning as well as getting groveries and transportation. Per Max Ellis-his aunt and cousin rotate with him to check on patient.   Comments: impaired vision at baseline, severe glaucoma. Spouse passed ~3MA.        OT Problem List: Decreased strength;Decreased activity tolerance;Impaired balance (sitting and/or standing)      OT Treatment/Interventions: Self-care/ADL training;Therapeutic activities;Therapeutic exercise;Patient/family education;DME and/or AE instruction    OT Goals(Current goals can be found in the care plan section) Acute Rehab OT Goals Patient Stated Goal: get stronger, stay out of the hospital OT Goal Formulation: With patient/family Time For Goal Achievement: 04/02/21 Potential to Achieve Goals: Good ADL Goals Pt Will Perform Lower  Body Dressing: with supervision;sit to/from stand Pt Will Transfer to Toilet: with supervision;with min guard assist;ambulating;bedside commode (with LRAD to/from commode ~10-15' away to improve tolerance for fxl HH distances.) Pt Will Perform Toileting - Clothing Manipulation and hygiene: with supervision;with min guard assist;sit to/from stand (with LRAD) Pt/caregiver will Perform Home Exercise Program: Increased strength;Both right and left upper extremity;With Supervision;With minimal assist  OT Frequency: Min 1X/week   Barriers to D/C:            Co-evaluation              AM-PAC OT "6 Clicks" Daily Activity     Outcome Measure Help from another person eating meals?: None Help from another person taking care of personal grooming?: A Little Help from another person toileting, which includes using toliet, bedpan, or urinal?: A Lot Help from another person bathing (including washing, rinsing, drying)?: A Lot Help from another person to put on and taking off regular upper body clothing?: A Little Help from  another person to put on and taking off regular lower body clothing?: A Little 6 Click Score: 17   End of Session Equipment Utilized During Treatment: Gait belt;Rolling walker Nurse Communication: Mobility status (notified RN of pt with good MAP, but low diastolic. No c/o dizziness with positional changes. RN okay's SPS to chair)  Activity Tolerance: Patient tolerated treatment well Patient left: in chair;with call bell/phone within reach;with nursing/sitter in room  OT Visit Diagnosis: Unsteadiness on feet (R26.81);Muscle weakness (generalized) (M62.81)                Time: [id number] OT Time Calculation (min): 39 min Charges:  OT General Charges $OT Visit: 1 Visit OT Evaluation $OT Eval Moderate Complexity: 1 Mod OT Treatments $Self Care/Home Management : 8-22 mins $Therapeutic Activity: 8-22 mins  Gerrianne Scale, MS, OTR/L ascom [phone] 03/19/21, 10:43 AM

## 2021-03-19 NOTE — TOC Initial Note (Signed)
Transition of Care [facility]) - Initial/Assessment Note    Patient Details  Name: Max Ellis MRN: [id number] Date of Birth: 1934/06/10  Transition of Care [facility]) CM/SW Contact:    Shelbie Hutching, RN Phone Number: 03/19/2021, 2:47 PM  Clinical Narrative:                 Patient placed under observation for AKI and anemia.  Patient received blood transfusion today.  RNCM met with patient at the bedside and then spoke with patient's daughter via phone with patient's permission.   Patient originally from home alone, admitted to the hospital back at the end of July and discharged to [facility].  [name], daughter, does not want patient to return to [facility] but prefers patient to return home with home health services and family support.  Daughter reports that between all the family they should be able to provide patient with care at home along with home health services.    Home Health referral for RN, PT, OT, and aide given to [facility] with [facility]   Patient has a walker and wheelchair at home.    Expected Discharge Plan: [place] Barriers to Discharge: Continued Medical Work up   Patient Goals and CMS Choice Patient states their goals for this hospitalization and ongoing recovery are:: Patient wants to feel better and patient's daughter wants patient to be able to go home with home health services. CMS Medicare.gov Compare Post Acute Care list provided to:: Patient Represenative (must comment) Choice offered to / list presented to : Adult Children  Expected Discharge Plan and Services Expected Discharge Plan: [place]   Discharge Planning Services: CM Consult Post Acute Care Choice: [place] arrangements for the past 2 months: Single Family Home                 DME Arranged: N/A DME Agency: NA       HH Arranged: RN, PT, OT, Nurse's Aide [place] Agency: [place] ([facility]) Date HH Agency Contacted:  03/19/21 Time [place]: [place] Representative spoke with at [place]: Floydene Flock  Prior Living Arrangements/Services Living arrangements for the past 2 months: [place] Lives with:: Self Patient language and need for interpreter reviewed:: Yes Do you feel safe going back to the place where you live?: Yes      Need for Family Participation in Patient Care: Yes (Comment) Care giver support system in place?: Yes (comment) (family support- son and daughter) Current home services: DME (walker and wheelchair) Criminal Activity/Legal Involvement Pertinent to Current Situation/Hospitalization: No - Comment as needed  Activities of Daily Living Home Assistive Devices/Equipment: None ADL Screening (condition at time of admission) Patient's cognitive ability adequate to safely complete daily activities?: Yes Is the patient deaf or have difficulty hearing?: Yes Does the patient have difficulty seeing, even when wearing glasses/contacts?: No Does the patient have difficulty concentrating, remembering, or making decisions?: No Patient able to express need for assistance with ADLs?: Yes Does the patient have difficulty dressing or bathing?: No Independently performs ADLs?: Yes (appropriate for developmental age) Does the patient have difficulty walking or climbing stairs?: No Weakness of Legs: None Weakness of Arms/Hands: None  Permission Sought/Granted Permission sought to share information with : Case Manager, Family Supports, Other (comment) Permission granted to share information with : Yes, Verbal Permission Granted  Share Information with NAME: [name]  Permission granted to share info w AGENCY: Home Health agencies  Permission granted to share info w Relationship: daughter     Emotional Assessment Appearance:: Appears stated age Attitude/Demeanor/Rapport: Engaged Affect (typically observed): Accepting Orientation: : Oriented to Self, Oriented to Place, Oriented to  Situation Alcohol / Substance Use: Not Applicable Psych Involvement: No (comment)  Admission diagnosis:  Acute renal failure superimposed on stage 4 chronic kidney disease, unspecified acute renal failure type ([place]) [N17.9, N18.4] Acute anemia [D64.9] Acute on chronic anemia [D64.9] Patient Active Problem List   Diagnosis Date Noted   Acute anemia 03/18/2021   Fall 02/19/2021   Acute lower UTI 02/19/2021   COVID-19 virus infection 02/18/2021   NSTEMI (non-ST elevated myocardial infarction) ([place]) 02/17/2021   Hypertensive urgency 02/16/2021   Acute urinary retention 02/16/2021   Chronic kidney disease, stage 4 (severe) ([place]) 02/16/2021   Benign prostatic hyperplasia with incomplete bladder emptying 03/03/2020   Essential hypertension 01/26/2017   Tobacco use disorder 01/26/2017   Pain in limb 01/26/2017   PCP:  [place] Pink, MD Pharmacy:   [id number] [id number]: [phone]: [phone]    Social Determinants of Health (SDOH) Interventions    Readmission Risk Interventions Readmission Risk Prevention Plan 02/20/2021  Transportation Screening Complete  Palliative Care Screening Complete  Medication Review (RN Care Manager) Complete  Some recent data might be hidden

## 2021-03-19 NOTE — Progress Notes (Signed)
[facility] Progress Note  Patient: Max Ellis    IWP:[id number]  DOA: 03/18/2021     Date of Service: the patient was seen and examined on 03/19/2021  Chief Complaint  Patient presents with   Abnormal Lab   Brief hospital course:  Max Ellis is a 87 y.o. male with medical history significant for history of COVID infection in July 2022, hypertension, BPH, CAD on Plavix, COPD, anemia of chronic disease, CKD stage 4, depression, secondary renal hyperparathyroidism, presents to the emergency department from facility for chief concerns of abnormal labs. He endorses dysuria for two days. He denies dysphagia, fever, chest pain, shortness of breath, abdominal pain, diarrhea. At baseline he has constipation. Family brought stool softener to facility but facility never gave it to him. Daughter at bedside states that he finally had 1 bowel movement that was good on evening of 03/17/2021.    ED w/up: Sodium level is 141, potassium 4.4, chloride 115, bicarb 18, BUN 64, serum creatinine of 3.52, nonfasting blood glucose 110, WBC 7.1, hemoglobin 7.6, platelets 230.   Emergency medicine provider ordered normal saline 100 mL/h.   Fecal occult was done by EDP who states it was negative   Assessment and Plan: Principal Problem:   Acute anemia Active Problems:   Essential hypertension   Benign prostatic hyperplasia with incomplete bladder emptying   Chronic kidney disease, stage 4 (severe) (HCC)   # Acute anemia, multifactorial due to iron deficiency, folate deficiency and CKD stage IV.  FOBT negative in the ED, known history of bleeding. 8/17 Hb 6.5, 1 unit PRBC transfused S/p darbepoetin alfa 25 mcg subcu x1 dose given due to CKD Monitor H&H and transfuse to keep Hb above 7   #Iron deficiency - Iron level is 27, TIBC 189, iron saturation is 14 Started Venofer 300 mg IV daily x2 doses Start oral supplement with vitamin C on discharge Follow with PCP repeat iron profile after 3  months.  #Vitamin B12 deficiency, started vitamin B12 1000 mcg IM injection during hospital stay, start oral supplement on discharge.  Follow with PCP to repeat vitamin B12 level 3 months.   # Acute kidney injury on CKD 4-I suspect this is secondary to dehydration - Serum creatinine on presentation is 3.52, EGFR 16 - Serum creatinine range is 2.73-3.17 Continue to monitor renal functions and urine output Mild metabolic acidosis, started sodium bicarbonate oral supplement Check BMP daily    # Hypertension -patient is currently normotensive as patient has taken his a.m. antihypertensive medications are ready - Resumed home amlodipine 10 mg, metoprolol tartrate 12.5 mg twice daily   # Hyperlipidemia-atorvastatin 80 mg nightly     # Asymptomatic bradycardia-EKG on 02/27/2021 showed sinus bradycardia with rate of 53, QTc 437 - Patient is on beta-blockade - No further interventions indicated at this time   # CAD-I did not resume Plavix and aspirin due to the acute anemia  # BPH-resumed home dose of finasteride 5 mg daily, tamsulosin 0.8 mg p.o. daily   # History of COPD-resumed ipratropium-albuterol Respimat, 1 puff inhalation every 6 hours as needed for wheezing   # GERD-PPI resumed   Body mass index is 18.62 kg/m.  Interventions:       Diet: Dysphagia 3 diet DVT Prophylaxis: SCD, pharmacological prophylaxis contraindicated due to anemia    Advance goals of care discussion: DNR  Family Communication: family was  NOT present at bedside, at the time of interview.  The pt provided permission to discuss medical plan with  the family. Opportunity was given to ask question and all questions were answered satisfactorily.   Disposition:  Pt is from Home, admitted with anemia, still has low Hb, received 1 unit of PRBC on 8/17, which precludes a safe discharge. Discharge to Home , when H&H will remain stable, most likely in 1 to 2 days.  Subjective: No significant overnight issues,  patient was feeling mild shortness of breath, denied any chest palpitation, no any abdominal pain, denied any nausea vomiting or any bleeding.  Physical Exam: General:  alert, mild Sob, NAD.  Appear in mild distress, affect appropriate Eyes: PERRLA ENT: Oral Mucosa Clear, dry  Neck: no JVD,  Cardiovascular: S1 and S2 Present, no Murmur,  Respiratory: good respiratory effort, Bilateral Air entry equal and Decreased, no Crackles, no wheezes Abdomen: Bowel Sound present, Soft and no tenderness,  Skin: no rashes Extremities: no Pedal edema, no calf tenderness Neurologic: without any new focal findings Gait not checked due to patient safety concerns  Vitals:   03/19/21 1010 03/19/21 1037 03/19/21 1157 03/19/21 1348  BP: (!) 122/50 (!) 120/47 (!) 126/50 115/67  Pulse: (!) 58 (!) 57 (!) 55 (!) 51  Resp: _0 Temp: (!) 97.5 F (36.4 C) (!) 97.5 F (36.4 C) (!) 97.5 F (36.4 C) 97.8 F (36.6 C)  TempSrc: Oral Oral Oral   SpO2: 100% 97% 100% 100%  Weight:      Height:       No intake or output data in the 24 hours ending 03/19/21 1353 Filed Weights   03/18/21 1158  Weight: 65.8 kg    Data Reviewed: I have personally reviewed and interpreted daily labs, tele strips, imagings as discussed above. I reviewed all nursing notes, pharmacy notes, vitals, pertinent old records I have discussed plan of care as described above with RN and patient/family.  CBC: Recent Labs  Lab 03/18/21 1202 03/19/21 0558  WBC 7.1 5.0  NEUTROABS 5.4  --   HGB 7.6* 6.5*  HCT 22.9* 20.0*  MCV 98.3 97.6  PLT 230 740   Basic Metabolic Panel: Recent Labs  Lab 03/18/21 1202 03/19/21 0558  NA 141 144  K 4.4 4.5  CL 115* 118*  CO2 18* 20*  GLUCOSE 110* 78  BUN 64* 63*  CREATININE 3.52* 3.18*  CALCIUM 8.8* 8.3*    Studies: DG Chest 2 View  Result Date: 03/18/2021 CLINICAL DATA:  Weakness and shortness of breath. EXAM: CHEST - 2 VIEW COMPARISON:  02/19/2021 FINDINGS: The cardio  pericardial silhouette is enlarged. Bibasilar atelectasis/infiltrate noted with small bilateral pleural effusions. Interstitial markings are diffusely coarsened with chronic features. Bones are diffusely demineralized. IMPRESSION: Bibasilar atelectasis/infiltrate with small bilateral pleural effusions. Electronically Signed   By: Misty Stanley M.D.   On: 03/18/2021 16:02    Scheduled Meds:  amLODipine  10 mg Oral Daily   atorvastatin  80 mg Oral QHS   calcitRIOL  0.25 mcg Oral Daily   cholecalciferol  1,000 Units Oral Daily   citalopram  20 mg Oral Daily   cyanocobalamin  1,000 mcg Intramuscular Daily   [START ON 03/21/2021] ferrous sulfate  325 mg Oral BID WC   finasteride  5 mg Oral Daily   metoprolol tartrate  12.5 mg Oral BID   pantoprazole  40 mg Oral QHS   sodium bicarbonate  650 mg Oral TID   tamsulosin  0.8 mg Oral Daily   traZODone  150 mg Oral QHS   Continuous Infusions:  iron sucrose  PRN Meds: acetaminophen **OR** acetaminophen, ipratropium-albuterol, ondansetron **OR** ondansetron (ZOFRAN) IV  Time spent: 35 minutes  Author: Val Riles. MD [facility] 03/19/2021 1:53 PM  To reach On-call, see care teams to locate the attending and reach out to them via [email or web address]. If 7PM-7AM, please contact night-coverage If you still have difficulty reaching the attending provider, please page the [facility] (Director on Call) for [facility] on amion for assistance.

## 2021-03-19 NOTE — Progress Notes (Signed)
PT Cancellation Note  Patient Details Name: Max Ellis MRN: [id number] DOB: 08/23/1933   Cancelled Treatment:    Reason Eval/Treat Not Completed: Medical issues which prohibited therapy Pt with Hgb 6.5, to get transfusion this AM, PT held.  Will maintain on caseload and attempt at a later time when appropriate.  Kreg Shropshire, DPT 03/19/2021, 9:28 AM

## 2021-03-20 DIAGNOSIS — D649 Anemia, unspecified: Secondary | ICD-10-CM | POA: Diagnosis not present

## 2021-03-20 LAB — BASIC METABOLIC PANEL
Anion gap: 5 (ref 5–15)
BUN: 62 mg/dL — ABNORMAL HIGH (ref 8–23)
CO2: 22 mmol/L (ref 22–32)
Calcium: 8.2 mg/dL — ABNORMAL LOW (ref 8.9–10.3)
Chloride: 118 mmol/L — ABNORMAL HIGH (ref 98–111)
Creatinine, Ser: 3.17 mg/dL — ABNORMAL HIGH (ref 0.61–1.24)
GFR, Estimated: 18 mL/min — ABNORMAL LOW (ref >60)
Glucose, Bld: 81 mg/dL (ref 70–99)
Potassium: 4.7 mmol/L (ref 3.5–5.1)
Sodium: 145 mmol/L (ref 135–145)

## 2021-03-20 LAB — CBC
HCT: 22.2 % — ABNORMAL LOW (ref 39.0–52.0)
Hemoglobin: 7.3 g/dL — ABNORMAL LOW (ref 13.0–17.0)
MCH: 31.5 pg (ref 26.0–34.0)
MCHC: 32.9 g/dL (ref 30.0–36.0)
MCV: 95.7 fL (ref 80.0–100.0)
Platelets: 192 10*3/uL (ref 150–400)
RBC: 2.32 MIL/uL — ABNORMAL LOW (ref 4.22–5.81)
RDW: 16.1 % — ABNORMAL HIGH (ref 11.5–15.5)
WBC: 6 10*3/uL (ref 4.0–10.5)
nRBC: 0 % (ref 0.0–0.2)

## 2021-03-20 LAB — HEMOGLOBIN AND HEMATOCRIT, BLOOD
HCT: 25.8 % — ABNORMAL LOW (ref 39.0–52.0)
Hemoglobin: 8.6 g/dL — ABNORMAL LOW (ref 13.0–17.0)

## 2021-03-20 LAB — MAGNESIUM: Magnesium: 2 mg/dL (ref 1.7–2.4)

## 2021-03-20 LAB — PHOSPHORUS: Phosphorus: 3.6 mg/dL (ref 2.5–4.6)

## 2021-03-20 MED ORDER — AMLODIPINE BESYLATE 5 MG PO TABS: 5.0000 mg | ORAL_TABLET | Freq: Every day | ORAL | Status: DC

## 2021-03-20 MED ORDER — SODIUM CHLORIDE 0.9 % IV SOLN
300.0000 mg | Freq: Once | INTRAVENOUS | Status: AC
  Administered 2021-03-21: 10:00:00 300 mg via INTRAVENOUS
  Filled 2021-03-20: qty 15

## 2021-03-20 MED ORDER — METOPROLOL TARTRATE 25 MG PO TABS: 12.5000 mg | ORAL_TABLET | Freq: Two times a day (BID) | ORAL | Status: DC

## 2021-03-20 NOTE — Progress Notes (Addendum)
[facility] Progress Note  Patient: Max Ellis    JSE:[id number]  DOA: 03/18/2021     Date of Service: the patient was seen and examined on 03/20/2021  Chief Complaint  Patient presents with   Abnormal Lab   Brief hospital course:  Max Ellis is a 87 y.o. male with medical history significant for history of COVID infection in July 2022, hypertension, BPH, CAD on Plavix, COPD, anemia of chronic disease, CKD stage 4, depression, secondary renal hyperparathyroidism, presents to the emergency department from facility for chief concerns of abnormal labs. He endorses dysuria for two days. He denies dysphagia, fever, chest pain, shortness of breath, abdominal pain, diarrhea. At baseline he has constipation. Family brought stool softener to facility but facility never gave it to him. Daughter at bedside states that he finally had 1 bowel movement that was good on evening of 03/17/2021.    ED w/up: Sodium level is 141, potassium 4.4, chloride 115, bicarb 18, BUN 64, serum creatinine of 3.52, nonfasting blood glucose 110, WBC 7.1, hemoglobin 7.6, platelets 230.   Emergency medicine provider ordered normal saline 100 mL/h.   Fecal occult was done by EDP who states it was negative   Assessment and Plan: Principal Problem:   Acute anemia Active Problems:   Essential hypertension   Benign prostatic hyperplasia with incomplete bladder emptying   Chronic kidney disease, stage 4 (severe) (HCC)   # Acute anemia, multifactorial due to iron deficiency, folate deficiency and CKD stage IV.  FOBT negative in the ED, known history of bleeding. 8/17 Hb 6.5, 1 unit PRBC transfused S/p darbepoetin alfa 25 mcg subcu x1 dose given due to CKD Monitor H&H and transfuse to keep Hb above 7 Hb 7.3 today, no bleeding, repeat H&H at 5 PM today and CBC tomorrow a.m., awaiting for FOBT   #Iron deficiency - Iron level is 27, TIBC 189, iron saturation is 14 Started Venofer 300 mg IV daily x3 doses Start oral  supplement with vitamin C on discharge Follow with PCP repeat iron profile after 3 months.  #Vitamin B12 deficiency, started vitamin B12 1000 mcg IM injection during hospital stay, start oral supplement on discharge.  Follow with PCP to repeat vitamin B12 level 3 months.   # Acute kidney injury on CKD 4-I suspect this is secondary to dehydration - Serum creatinine on presentation is 3.52, EGFR 16 - Serum creatinine range is 2.73-3.17 Continue to monitor renal functions and urine output Mild metabolic acidosis, started sodium bicarbonate oral supplement Check BMP daily    # Hypertension -patient is currently normotensive as patient has taken his a.m. antihypertensive medications are ready - Resumed home amlodipine 10 mg, metoprolol tartrate 12.5 mg twice daily 8/18 Blood pressure soft, held antihypertensive medications today   # Hyperlipidemia-atorvastatin 80 mg nightly     # Asymptomatic bradycardia-EKG on 02/27/2021 showed sinus bradycardia with rate of 53, QTc 437 - Patient is on beta-blockade - No further interventions indicated at this time   # CAD-I did not resume Plavix and aspirin due to the acute anemia  # BPH-resumed home dose of finasteride 5 mg daily, tamsulosin 0.8 mg p.o. daily   # History of COPD-resumed ipratropium-albuterol Respimat, 1 puff inhalation every 6 hours as needed for wheezing   # GERD-PPI resumed   Body mass index is 18.62 kg/m.  Interventions:       Diet: Dysphagia 3 diet DVT Prophylaxis: SCD, pharmacological prophylaxis contraindicated due to anemia    Advance goals of care discussion:  DNR  Family Communication: family was  NOT present at bedside, at the time of interview.  The pt provided permission to discuss medical plan with the family. Opportunity was given to ask question and all questions were answered satisfactorily.   Disposition:  Pt is from Home, admitted with anemia, still has low Hb, received 1 unit of PRBC on 8/17, Hb 7.3  today, borderline low, we will check H&H at 5 PM and CBC tomorrow a.m., FOBT pending which precludes a safe discharge. Discharge to Home , when H&H will remain stable, most likely discharge tomorrow a.m.  Subjective: No significant overnight issues, patient was sleepy but woke up by calling his name, seems to be alert, denied any active issues, no shortness of breath, no chest pain or palpitations. Patient feels better as compared to yesterday.  Physical Exam: General:  alert,  NAD.  Appear in mild distress, affect appropriate Eyes: PERRLA ENT: Oral Mucosa Clear, dry  Neck: no JVD,  Cardiovascular: S1 and S2 Present, no Murmur,  Respiratory: good respiratory effort, Bilateral Air entry equal and Decreased, no Crackles, no wheezes Abdomen: Bowel Sound present, Soft and no tenderness,  Skin: no rashes Extremities: no Pedal edema, no calf tenderness Neurologic: without any new focal findings Gait not checked due to patient safety concerns  Vitals:   03/20/21 0019 03/20/21 0403 03/20/21 0728 03/20/21 1112  BP: (!) 104/57 (!) 114/54 (!) 118/48 (!) 129/53  Pulse: (!) 57 (!) 57 (!) 57 60  Resp: 16 16 14 14   Temp: 97.6 F (36.4 C) 98 F (36.7 C) 98 F (36.7 C) 98.2 F (36.8 C)  TempSrc:   Oral   SpO2: 100% 98% 99% 99%  Weight:      Height:        Intake/Output Summary (Last 24 hours) at 03/20/2021 1534 Last data filed at 03/20/2021 1300 Gross per 24 hour  Intake 385.75 ml  Output 250 ml  Net 135.75 ml   Filed Weights   03/18/21 1158  Weight: 65.8 kg    Data Reviewed: I have personally reviewed and interpreted daily labs, tele strips, imagings as discussed above. I reviewed all nursing notes, pharmacy notes, vitals, pertinent old records I have discussed plan of care as described above with RN and patient/family.  CBC: Recent Labs  Lab 03/18/21 1202 03/19/21 0558 03/20/21 0528  WBC 7.1 5.0 6.0  NEUTROABS 5.4  --   --   HGB 7.6* 6.5* 7.3*  HCT 22.9* 20.0* 22.2*  MCV  98.3 97.6 95.7  PLT 230 192 409   Basic Metabolic Panel: Recent Labs  Lab 03/18/21 1202 03/19/21 0558 03/20/21 0528  NA 141 144 145  K 4.4 4.5 4.7  CL 115* 118* 118*  CO2 18* 20* 22  GLUCOSE 110* 78 81  BUN 64* 63* 62*  CREATININE 3.52* 3.18* 3.17*  CALCIUM 8.8* 8.3* 8.2*  MG  --   --  2.0  PHOS  --   --  3.6    Studies: No results found.  Scheduled Meds:  [START ON 03/21/2021] amLODipine  5 mg Oral Daily   atorvastatin  80 mg Oral QHS   calcitRIOL  0.25 mcg Oral Daily   cholecalciferol  1,000 Units Oral Daily   citalopram  20 mg Oral Daily   cyanocobalamin  1,000 mcg Intramuscular Daily   [START ON 03/21/2021] ferrous sulfate  325 mg Oral BID WC   finasteride  5 mg Oral Daily   [START ON 03/21/2021] metoprolol tartrate  12.5 mg  Oral BID   pantoprazole  40 mg Oral QHS   polyethylene glycol  17 g Oral Daily   sodium bicarbonate  650 mg Oral TID   tamsulosin  0.8 mg Oral Daily   traZODone  150 mg Oral QHS   Continuous Infusions:   PRN Meds: acetaminophen **OR** acetaminophen, bisacodyl, bisacodyl, ipratropium-albuterol, ondansetron **OR** ondansetron (ZOFRAN) IV  Time spent: 35 minutes  Author: Val Riles. MD [facility] 03/20/2021 3:34 PM  To reach On-call, see care teams to locate the attending and reach out to them via [email or web address]. If 7PM-7AM, please contact night-coverage If you still have difficulty reaching the attending provider, please page the [facility] (Director on Call) for [facility] on amion for assistance.

## 2021-03-21 DIAGNOSIS — D649 Anemia, unspecified: Secondary | ICD-10-CM | POA: Diagnosis not present

## 2021-03-21 LAB — MAGNESIUM: Magnesium: 1.9 mg/dL (ref 1.7–2.4)

## 2021-03-21 LAB — CBC
HCT: 22.9 % — ABNORMAL LOW (ref 39.0–52.0)
Hemoglobin: 7.3 g/dL — ABNORMAL LOW (ref 13.0–17.0)
MCH: 29.9 pg (ref 26.0–34.0)
MCHC: 31.9 g/dL (ref 30.0–36.0)
MCV: 93.9 fL (ref 80.0–100.0)
Platelets: 191 10*3/uL (ref 150–400)
RBC: 2.44 MIL/uL — ABNORMAL LOW (ref 4.22–5.81)
RDW: 15.6 % — ABNORMAL HIGH (ref 11.5–15.5)
WBC: 6.3 10*3/uL (ref 4.0–10.5)
nRBC: 0 % (ref 0.0–0.2)

## 2021-03-21 LAB — BASIC METABOLIC PANEL
Anion gap: 6 (ref 5–15)
BUN: 55 mg/dL — ABNORMAL HIGH (ref 8–23)
CO2: 19 mmol/L — ABNORMAL LOW (ref 22–32)
Calcium: 8.2 mg/dL — ABNORMAL LOW (ref 8.9–10.3)
Chloride: 116 mmol/L — ABNORMAL HIGH (ref 98–111)
Creatinine, Ser: 3.16 mg/dL — ABNORMAL HIGH (ref 0.61–1.24)
GFR, Estimated: 19 mL/min — ABNORMAL LOW (ref >60)
Glucose, Bld: 80 mg/dL (ref 70–99)
Potassium: 4.4 mmol/L (ref 3.5–5.1)
Sodium: 141 mmol/L (ref 135–145)

## 2021-03-21 LAB — PREPARE RBC (CROSSMATCH)

## 2021-03-21 LAB — PHOSPHORUS: Phosphorus: 3.4 mg/dL (ref 2.5–4.6)

## 2021-03-21 MED ORDER — METOPROLOL SUCCINATE ER 25 MG PO TB24: 12.5000 mg | ORAL_TABLET | Freq: Every day | ORAL | Status: DC

## 2021-03-21 MED ORDER — VITAMIN C 500 MG PO TABS: 250.0000 mg | ORAL_TABLET | Freq: Every day | ORAL | 2 refills | Status: DC

## 2021-03-21 MED ORDER — AMLODIPINE BESYLATE 5 MG PO TABS: 5.0000 mg | ORAL_TABLET | Freq: Every day | ORAL | Status: DC

## 2021-03-21 MED ORDER — METOPROLOL TARTRATE 25 MG PO TABS: 12.5000 mg | ORAL_TABLET | Freq: Two times a day (BID) | ORAL | Status: DC

## 2021-03-21 MED ORDER — POLYSACCHARIDE IRON COMPLEX 150 MG PO CAPS: 150.0000 mg | ORAL_CAPSULE | Freq: Every day | ORAL | 2 refills | Status: DC

## 2021-03-21 MED ORDER — B-12 500 MCG PO TABS: 1.0000 | ORAL_TABLET | Freq: Every day | ORAL | 2 refills | Status: DC

## 2021-03-21 MED ORDER — SODIUM CHLORIDE 0.9% IV SOLUTION: Freq: Once | INTRAVENOUS | Status: AC

## 2021-03-21 MED ORDER — SODIUM BICARBONATE 650 MG PO TABS
650.0000 mg | ORAL_TABLET | Freq: Two times a day (BID) | ORAL | 0 refills | Status: DC
Start: 2021-03-21 — End: 2021-04-14

## 2021-03-21 MED ORDER — AMLODIPINE BESYLATE 10 MG PO TABS: 10.0000 mg | ORAL_TABLET | Freq: Every day | ORAL | Status: DC

## 2021-03-21 NOTE — Progress Notes (Signed)
PT Cancellation Note  Patient Details Name: Max Ellis MRN: [id number] DOB: 01/18/1934   Cancelled Treatment:    Reason Eval/Treat Not Completed: Medical issues which prohibited therapy  Pt receiving blood transfusion on attempt.  Will return at a later time/date if not discharged as orders are in.   [name] 03/21/2021, 1:49 PM

## 2021-03-21 NOTE — Discharge Summary (Signed)
[facility] Discharge Summary   Patient: Max Ellis [id number]  PCP: [place] Pink, MD  Date of admission: 03/18/2021   Date of discharge:  03/21/2021     Discharge Diagnoses:  Principal Problem:   Acute anemia Active Problems:   Essential hypertension   Benign prostatic hyperplasia with incomplete bladder emptying   Chronic kidney disease, stage 4 (severe) ([place])   Admitted From: Home Disposition:  Home with HHPT  Recommendations for Outpatient Follow-up:  PCP: in 1 wk Nephrology in 1 wk Follow up LABS/TEST:  cbc and BMP in 1 wk   Diet recommendation: Cardiac diet  Activity: The patient is advised to gradually reintroduce usual activities, as tolerated  Discharge Condition: stable  Code Status: DNR   History of present illness: As per the H and P dictated on admission Hospital Course:  Max Ellis is a 87 y.o. male with medical history significant for history of COVID infection in July 2022, hypertension, BPH, CAD on Plavix, COPD, anemia of chronic disease, CKD stage 4, depression, secondary renal hyperparathyroidism, presents to the emergency department from facility for chief concerns of abnormal labs. He endorses dysuria for two days. He denies dysphagia, fever, chest pain, shortness of breath, abdominal pain, diarrhea. At baseline he has constipation. Family brought stool softener to facility but facility never gave it to him. Daughter at bedside states that he finally had 1 bowel movement that was good on evening of 03/17/2021.   ED w/up: Sodium level is 141, potassium 4.4, chloride 115, bicarb 18, BUN 64, serum creatinine of 3.52, nonfasting blood glucose 110, WBC 7.1, hemoglobin 7.6, platelets 230. Emergency medicine provider ordered normal saline 100 mL/h. Fecal occult was done by EDP who states it was negative Principal Problem:   Acute anemia Active Problems:   Essential hypertension   Benign prostatic hyperplasia with incomplete bladder emptying    Chronic kidney disease, stage 4 (severe) ([facility]) # Acute anemia, multifactorial due to iron deficiency, folate deficiency and CKD stage IV.  FOBT negative in the ED, known history of bleeding. 8/17 Hb 6.5, 1 unit PRBC transfused. S/p darbepoetin alfa 25 mcg subcu x1 dose given due to CKD. Hb 7.3, no bleeding, and CBC shows hemoglobin 7.3 today.  Patient has CKD would benefit from the transfusion so transfuse 1 unit of PRBC before discharge.  Repeat CBC after 1 week and follow with PCP and nephrology as an outpatient.   # Iron deficiency,  Iron level is 27, TIBC 189, iron saturation is 14, s/p Venofer 300 mg IV daily x3 doses, Started oral supplement with vitamin C on discharge. Follow with PCP repeat iron profile after 3 months. #Vitamin B12 deficiency, started vitamin B12 1000 mcg IM injection during hospital stay, started oral supplement on discharge.  Follow with PCP to repeat vitamin B12 level 3 months. # Acute kidney injury on CKD 4-I suspect this is secondary to dehydration, Serum creatinine on presentation is 3.52, EGFR 16, Serum creatinine range is 1.63-8.46, Mild metabolic acidosis, started sodium bicarbonate oral supplement.  Patient was advised to follow with nephrologist as an outpatient for # Hypertension, home medications were continued, patient blood pressure remains soft so medications adjusted.  Patient was advised to monitor BP at home and follow with PCP to titrate medication accordingly.   # Hyperlipidemia-atorvastatin 80 mg nightly # Asymptomatic bradycardia-EKG on 02/27/2021 showed sinus bradycardia with rate of 53, QTc 437, Patient is on beta-blockade. No further interventions indicated at this time # CAD- Held Plavix and aspirin due to  the acute anemia.  No bleeding noticed, so aspirin and Plavix were resumed on discharge.  Patient was advised to monitor for any signs of bleeding and return back to ED.  Stop taking aspirin and Plavix if any signs of bleeding. # BPH-resumed home dose of  finasteride 5 mg daily, tamsulosin 0.8 mg p.o. daily # History of COPD-resumed ipratropium-albuterol Respimat, 1 puff inhalation every 6 hours as needed for wheezing # GERD-PPI resumed Body mass index is 18.62 kg/m.  Interventions:  - Patient was instructed, not to drive, operate heavy machinery, perform activities at heights, swimming or participation in water activities or provide baby sitting services while on Pain, Sleep and Anxiety Medications; until his outpatient Physician has advised to do so again.  - Also recommended to not to take more than prescribed Pain, Sleep and Anxiety Medications.  Patient was seen by physical therapy, who recommended Home health, which was arranged. On the day of the discharge the patient's vitals were stable, and no other acute medical condition were reported by patient. the patient was felt safe to be discharge at Home with Home health.  Consultants: None Procedures: None  Discharge Exam: General: Appear in no distress, no Rash; Oral Mucosa Clear, moist. Cardiovascular: S1 and S2 Present, no Murmur, Respiratory: normal respiratory effort, Bilateral Air entry present and no Crackles, no wheezes Abdomen: Bowel Sound present, Soft and no tenderness, no hernia Extremities: no Pedal edema, no calf tenderness Neurology: alert and oriented to time, place, and person affect appropriate.  Filed Weights   03/18/21 1158  Weight: 65.8 kg   Vitals:   03/21/21 1225 03/21/21 1250  BP: (!) 134/46 (!) 125/49  Pulse: 67 63  Resp: 16 16  Temp: 98 F (36.7 C) 98.2 F (36.8 C)  SpO2: 97% 100%    DISCHARGE MEDICATION: Allergies as of 03/21/2021   No Known Allergies      Medication List     TAKE these medications    amLODipine 10 MG tablet Commonly known as: NORVASC Take 1 tablet (10 mg total) by mouth daily. Start taking half systolic BP greater than 762 mmHg What changed: additional instructions   aspirin 81 MG EC tablet Commonly known as:  Aspirin 81 Take 1 tablet (81 mg total) by mouth daily.   atorvastatin 80 MG tablet Commonly known as: LIPITOR Take 1 tablet (80 mg total) by mouth daily.   B-12 500 MCG Tabs Take 1 tablet by mouth daily.   calcitRIOL 0.25 MCG capsule Commonly known as: ROCALTROL Take 0.25 mcg by mouth daily.   cholecalciferol 25 MCG (1000 UNIT) tablet Commonly known as: VITAMIN D3 Take 1,000 Units by mouth daily.   citalopram 20 MG tablet Commonly known as: CELEXA Take 20 mg by mouth daily.   clopidogrel 75 MG tablet Commonly known as: PLAVIX Take 1 tablet (75 mg total) by mouth daily.   famotidine 20 MG tablet Commonly known as: PEPCID Take 20 mg by mouth daily.   finasteride 5 MG tablet Commonly known as: PROSCAR Take 5 mg by mouth daily.   Ipratropium-Albuterol 20-100 MCG/ACT Aers respimat Commonly known as: COMBIVENT Inhale 1 puff into the lungs every 6 (six) hours as needed for wheezing.   iron polysaccharides 150 MG capsule Commonly known as: NIFEREX Take 1 capsule (150 mg total) by mouth daily.   latanoprost 0.005 % ophthalmic solution Commonly known as: XALATAN Place 1 drop into both eyes at bedtime.   metoprolol tartrate 25 MG tablet Commonly known as: LOPRESSOR Take 0.5 tablets (  12.5 mg total) by mouth 2 (two) times daily. Skip dose if systolic BP less than 073 mmHg and or HR less than 65 What changed: additional instructions   omeprazole 20 MG capsule Commonly known as: PRILOSEC Take 20 mg by mouth at bedtime.   sodium bicarbonate 650 MG tablet Take 1 tablet (650 mg total) by mouth 2 (two) times daily.   tamsulosin 0.4 MG Caps capsule Commonly known as: FLOMAX Take 0.8 mg by mouth daily.   traZODone 150 MG tablet Commonly known as: DESYREL Take 150 mg by mouth at bedtime.   vitamin C 500 MG tablet Commonly known as: ASCORBIC ACID Take 0.5 tablets (250 mg total) by mouth daily.               Durable Medical Equipment  (From admission, onward)            Start     Ordered   03/20/21 0846  For home use only DME Walker rolling  Once       Question Answer Comment  Walker: With [place]   Patient needs a walker to treat with the following condition Weakness      03/20/21 0846           No Known Allergies Discharge Instructions     Call MD for:  difficulty breathing, headache or visual disturbances   Complete by: As directed    Call MD for:  extreme fatigue   Complete by: As directed    Call MD for:  persistant dizziness or light-headedness   Complete by: As directed    Call MD for:  persistant nausea and vomiting   Complete by: As directed    Call MD for:  severe uncontrolled pain   Complete by: As directed    Call MD for:  temperature >100.4   Complete by: As directed    Diet - low sodium heart healthy   Complete by: As directed    Discharge instructions   Complete by: As directed    Follow with PCP in 1 week, repeat CBC after 1 to 2 weeks Follow with nephrologist in 1 to 2 weeks   Increase activity slowly   Complete by: As directed        The results of significant diagnostics from this hospitalization (including imaging, microbiology, ancillary and laboratory) are listed below for reference.    Significant Diagnostic Studies: DG Chest 2 View  Result Date: 03/18/2021 CLINICAL DATA:  Weakness and shortness of breath. EXAM: CHEST - 2 VIEW COMPARISON:  02/19/2021 FINDINGS: The cardio pericardial silhouette is enlarged. Bibasilar atelectasis/infiltrate noted with small bilateral pleural effusions. Interstitial markings are diffusely coarsened with chronic features. Bones are diffusely demineralized. IMPRESSION: Bibasilar atelectasis/infiltrate with small bilateral pleural effusions. Electronically Signed   By: Misty Stanley M.D.   On: 03/18/2021 16:02   CT Head Wo Contrast  Result Date: 02/19/2021 CLINICAL DATA:  [place], COVID-19 positive, hit head EXAM: CT HEAD WITHOUT CONTRAST TECHNIQUE: Contiguous axial  images were obtained from the base of the skull through the vertex without intravenous contrast. COMPARISON:  None. FINDINGS: Brain: Confluent hypodensities throughout the periventricular and subcortical white matter are most consistent with chronic small vessel ischemic changes. 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 No signs of acute infarct or hemorrhage. There is diffuse cerebral atrophy with ex vacuo dilatation of the lateral ventricles. Midline structures are unremarkable. No acute extra-axial fluid collections. No mass effect. Vascular: Extensive atherosclerosis of the internal carotid arteries. No hyperdense  vessel. Skull: Normal. Negative for fracture or focal lesion. Sinuses/Orbits: No acute finding. Other: None. IMPRESSION: 1. Chronic ischemic changes as above. No acute intracranial process. Electronically Signed   By: Randa Ngo M.D.   On: 02/19/2021 20:50   CT Cervical Spine Wo Contrast  Result Date: 02/19/2021 CLINICAL DATA:  [place], COVID-19 positive EXAM: CT CERVICAL SPINE WITHOUT CONTRAST TECHNIQUE: Multidetector CT imaging of the cervical spine was performed without intravenous contrast. Multiplanar CT image reconstructions were also generated. COMPARISON:  None. FINDINGS: Alignment: Left convex curvature of the cervical spine. Otherwise alignment is anatomic. Skull base and vertebrae: No acute fracture. No primary bone lesion or focal pathologic process. Soft tissues and spinal canal: No prevertebral fluid or swelling. No visible canal hematoma. Disc levels: Multilevel cervical spondylosis and facet hypertrophy, with disc space narrowing most pronounced at C4-5, C5-6, and C6-7. Upper chest: Stable pleural and parenchymal density at the right apex consistent with chronic scarring, without significant change since chest CT 04/13/2019. No acute airspace disease. Central airway is patent. Other: Reconstructed images demonstrate no additional findings. IMPRESSION: 1. No acute cervical spine  fracture. 2. Multilevel cervical spondylosis and facet hypertrophy as above. 3. Chronic right apical pleural and parenchymal scarring. Electronically Signed   By: Randa Ngo M.D.   On: 02/19/2021 20:52   DG Chest Portable 1 View  Result Date: 02/19/2021 CLINICAL DATA:  Fall. Fall at home. History of frequent falls. COVID positive 2 days ago. EXAM: PORTABLE CHEST 1 VIEW COMPARISON:  Radiograph 4 days ago 02/15/2021, CT 3 days ago 02/16/2021. FINDINGS: Right apical pleuroparenchymal scarring. Subsegmental atelectasis at the left lung base. Stable heart size and mediastinal contours. Aortic atherosclerosis. There is no pulmonary edema, pleural effusion, or pneumothorax. No acute osseous abnormalities are seen. IMPRESSION: No acute abnormality. Electronically Signed   By: Keith Rake M.D.   On: 02/19/2021 22:30   CT MAXILLOFACIAL WO CONTRAST  Result Date: 02/19/2021 CLINICAL DATA:  [place], COVID-19 positive, hit head EXAM: CT MAXILLOFACIAL WITHOUT CONTRAST TECHNIQUE: Multidetector CT imaging of the maxillofacial structures was performed. Multiplanar CT image reconstructions were also generated. COMPARISON:  None. FINDINGS: Osseous: There is a minimally displaced left-sided nasal bone fracture, with overlying soft tissue swelling. No other acute facial bone fractures are identified. Orbits: Negative. No traumatic or inflammatory finding. Sinuses: Clear. Soft tissues: Prominent soft tissue swelling along the left aspect of the nasal bridge extending into the left infraorbital region. Remaining soft tissues are unremarkable. Limited intracranial: No significant or unexpected finding. IMPRESSION: 1. Minimally displaced left-sided nasal bone fracture with overlying soft tissue swelling. Electronically Signed   By: Randa Ngo M.D.   On: 02/19/2021 20:54    Microbiology: Recent Results (from the past 240 hour(s))  SARS CORONAVIRUS 2 (TAT 6-24 HRS) Nasopharyngeal Nasopharyngeal Swab     Status: None    Collection Time: 03/18/21  2:52 PM   Specimen: Nasopharyngeal Swab  Result Value Ref Range Status   SARS Coronavirus 2 NEGATIVE NEGATIVE Final    Comment: (NOTE) SARS-CoV-2 target nucleic acids are NOT DETECTED.  The SARS-CoV-2 RNA is generally detectable in upper and lower respiratory specimens during the acute phase of infection. Negative results do not preclude SARS-CoV-2 infection, do not rule out co-infections with other pathogens, and should not be used as the sole basis for treatment or other patient management decisions. Negative results must be combined with clinical observations, patient history, and epidemiological information. The expected result is Negative.  Fact Sheet for Patients: SugarRoll.be  Fact Sheet for  Healthcare Providers: https://www.woods-mathews.com/  This test is not yet approved or cleared by the [place] and  has been authorized for detection and/or diagnosis of SARS-CoV-2 by FDA under an Emergency Use Authorization (EUA). This EUA will remain  in effect (meaning this test can be used) for the duration of the COVID-19 declaration under Se ction 564(b)(1) of the Act, 21 U.S.C. section 360bbb-3(b)(1), unless the authorization is terminated or revoked sooner.  Performed at [facility], [place] [address] [place], [place] [address]      Labs: CBC: Recent Labs  Lab 03/18/21 1202 03/19/21 0558 03/20/21 0528 03/20/21 1656 03/21/21 0437  WBC 7.1 5.0 6.0  --  6.3  NEUTROABS 5.4  --   --   --   --   HGB 7.6* 6.5* 7.3* 8.6* 7.3*  HCT 22.9* 20.0* 22.2* 25.8* 22.9*  MCV 98.3 97.6 95.7  --  93.9  PLT 230 192 192  --  063   Basic Metabolic Panel: Recent Labs  Lab 03/18/21 1202 03/19/21 0558 03/20/21 0528 03/21/21 0437  NA 141 144 145 141  K 4.4 4.5 4.7 4.4  CL 115* 118* 118* 116*  CO2 18* 20* 22 19*  GLUCOSE 110* 78 81 80  BUN 64* 63* 62* 55*  CREATININE 3.52* 3.18* 3.17* 3.16*   CALCIUM 8.8* 8.3* 8.2* 8.2*  MG  --   --  2.0 1.9  PHOS  --   --  3.6 3.4   Liver Function Tests: Recent Labs  Lab 03/18/21 1202  AST 14*  ALT 11  ALKPHOS 59  BILITOT 0.4  PROT 5.8*  ALBUMIN 2.2*   No results for input(s): LIPASE, AMYLASE in the last 168 hours. No results for input(s): AMMONIA in the last 168 hours. Cardiac Enzymes: No results for input(s): CKTOTAL, CKMB, CKMBINDEX, TROPONINI in the last 168 hours. BNP (last 3 results) No results for input(s): BNP in the last 8760 hours. CBG: No results for input(s): GLUCAP in the last 168 hours.  Time spent: 35 minutes  Signed:  Val Riles  [facility]  03/21/2021 1:00 PM

## 2021-03-21 NOTE — TOC Transition Note (Signed)
Transition of Care [facility]) - CM/SW Discharge Note   Patient Details  Name: Max Ellis MRN: [id number] Date of Birth: 10/03/1933  Transition of Care [facility] CM/SW Contact:  Shelbie Hutching, RN Phone Number: 03/21/2021, 1:00 PM   Clinical Narrative:    Patient medically cleared for discharge home with home health services.  [facility] accepted referral for RN, PT, OT and aide.  RW ordered and will be delivered to the room by [facility].  Patient's daughter should be able to pick patient up.     Final next level of care: Home w Home Health Services Barriers to Discharge: Barriers Resolved   Patient Goals and CMS Choice Patient states their goals for this hospitalization and ongoing recovery are:: Patient wants to feel better and patient's daughter wants patient to be able to go home with home health services. CMS Medicare.gov Compare Post Acute Care list provided to:: Patient Represenative (must comment) Choice offered to / list presented to : Adult Children  Discharge Placement                       Discharge Plan and Services   Discharge Planning Services: CM Consult Post Acute Care Choice: Home Health          DME Arranged: Walker rolling DME Agency: [facility] Date DME Agency Contacted: 03/21/21 Time DME Agency Contacted: 1300 Representative spoke with at DME Agency: [place]: RN, PT, OT, Nurse's Aide [place] Agency: [place] ([place]) Date [place]: 03/21/21 Time [place]: 1300 Representative spoke with at [place]: [place] ([place]) Interventions     Readmission Risk Interventions Readmission Risk Prevention Plan 02/20/2021  Transportation Screening Complete  Palliative Care Screening Complete  Medication Review (RN Care Manager) Complete  Some recent data might be hidden

## 2021-03-21 NOTE — Care Management Obs Status (Signed)
[place] NOTIFICATION   Patient Details  Name: LATRAVIUS CULLINAN MRN: [id number] Date of Birth: [birth year]   Medicare Observation Status Notification Given:  Yes    Shelbie Hutching, RN 03/21/2021, 1:14 PM

## 2021-03-22 LAB — TYPE AND SCREEN
ABO/RH(D): A POS
Antibody Screen: NEGATIVE
Unit division: 0
Unit division: 0

## 2021-03-22 LAB — BPAM RBC
Blood Product Expiration Date: 202208282359
Blood Product Expiration Date: 202209062359
ISSUE DATE / TIME: 202208171017
ISSUE DATE / TIME: 202208191230
Unit Type and Rh: 600
Unit Type and Rh: 6200

## 2021-04-11 ENCOUNTER — Ambulatory Visit: Payer: Self-pay | Admitting: Urology

## 2021-04-11 ENCOUNTER — Other Ambulatory Visit: Payer: Self-pay

## 2021-04-11 ENCOUNTER — Emergency Department: Payer: Medicare Other

## 2021-04-11 ENCOUNTER — Inpatient Hospital Stay
Admission: EM | Admit: 2021-04-11 | Discharge: 2021-04-14 | DRG: 070 | Disposition: A | Discharge status: Hospice | Payer: Medicare Other | Attending: Internal Medicine | Admitting: Internal Medicine

## 2021-04-11 DIAGNOSIS — E43 Unspecified severe protein-calorie malnutrition: Secondary | ICD-10-CM | POA: Diagnosis present

## 2021-04-11 DIAGNOSIS — F17210 Nicotine dependence, cigarettes, uncomplicated: Secondary | ICD-10-CM | POA: Diagnosis present

## 2021-04-11 DIAGNOSIS — R54 Age-related physical debility: Secondary | ICD-10-CM | POA: Diagnosis present

## 2021-04-11 DIAGNOSIS — I252 Old myocardial infarction: Secondary | ICD-10-CM | POA: Diagnosis not present

## 2021-04-11 DIAGNOSIS — Z66 Do not resuscitate: Secondary | ICD-10-CM | POA: Diagnosis present

## 2021-04-11 DIAGNOSIS — Z7982 Long term (current) use of aspirin: Secondary | ICD-10-CM

## 2021-04-11 DIAGNOSIS — I739 Peripheral vascular disease, unspecified: Secondary | ICD-10-CM | POA: Diagnosis present

## 2021-04-11 DIAGNOSIS — I2510 Atherosclerotic heart disease of native coronary artery without angina pectoris: Secondary | ICD-10-CM | POA: Diagnosis present

## 2021-04-11 DIAGNOSIS — N400 Enlarged prostate without lower urinary tract symptoms: Secondary | ICD-10-CM | POA: Diagnosis present

## 2021-04-11 DIAGNOSIS — I509 Heart failure, unspecified: Secondary | ICD-10-CM

## 2021-04-11 DIAGNOSIS — R262 Difficulty in walking, not elsewhere classified: Secondary | ICD-10-CM | POA: Diagnosis present

## 2021-04-11 DIAGNOSIS — Z681 Body mass index (BMI) 19 or less, adult: Secondary | ICD-10-CM

## 2021-04-11 DIAGNOSIS — I5031 Acute diastolic (congestive) heart failure: Secondary | ICD-10-CM | POA: Diagnosis present

## 2021-04-11 DIAGNOSIS — R627 Adult failure to thrive: Secondary | ICD-10-CM | POA: Diagnosis present

## 2021-04-11 DIAGNOSIS — R4182 Altered mental status, unspecified: Secondary | ICD-10-CM

## 2021-04-11 DIAGNOSIS — Z79899 Other long term (current) drug therapy: Secondary | ICD-10-CM

## 2021-04-11 DIAGNOSIS — G9341 Metabolic encephalopathy: Principal | ICD-10-CM | POA: Diagnosis present

## 2021-04-11 DIAGNOSIS — Z20822 Contact with and (suspected) exposure to covid-19: Secondary | ICD-10-CM | POA: Diagnosis present

## 2021-04-11 DIAGNOSIS — N184 Chronic kidney disease, stage 4 (severe): Secondary | ICD-10-CM | POA: Diagnosis present

## 2021-04-11 DIAGNOSIS — Z515 Encounter for palliative care: Secondary | ICD-10-CM | POA: Diagnosis not present

## 2021-04-11 DIAGNOSIS — J449 Chronic obstructive pulmonary disease, unspecified: Secondary | ICD-10-CM | POA: Diagnosis present

## 2021-04-11 DIAGNOSIS — R339 Retention of urine, unspecified: Secondary | ICD-10-CM | POA: Diagnosis present

## 2021-04-11 DIAGNOSIS — I130 Hypertensive heart and chronic kidney disease with heart failure and stage 1 through stage 4 chronic kidney disease, or unspecified chronic kidney disease: Secondary | ICD-10-CM | POA: Diagnosis present

## 2021-04-11 DIAGNOSIS — E872 Acidosis: Secondary | ICD-10-CM | POA: Diagnosis present

## 2021-04-11 DIAGNOSIS — Z8616 Personal history of COVID-19: Secondary | ICD-10-CM | POA: Diagnosis not present

## 2021-04-11 DIAGNOSIS — Z7189 Other specified counseling: Secondary | ICD-10-CM | POA: Diagnosis not present

## 2021-04-11 LAB — CBC WITH DIFFERENTIAL/PLATELET
Abs Immature Granulocytes: 0.05 10*3/uL (ref 0.00–0.07)
Basophils Absolute: 0.1 10*3/uL (ref 0.0–0.1)
Basophils Relative: 1 %
Eosinophils Absolute: 0 10*3/uL (ref 0.0–0.5)
Eosinophils Relative: 0 %
HCT: 35.6 % — ABNORMAL LOW (ref 39.0–52.0)
Hemoglobin: 11.7 g/dL — ABNORMAL LOW (ref 13.0–17.0)
Immature Granulocytes: 1 %
Lymphocytes Relative: 9 %
Lymphs Abs: 0.9 10*3/uL (ref 0.7–4.0)
MCH: 30.8 pg (ref 26.0–34.0)
MCHC: 32.9 g/dL (ref 30.0–36.0)
MCV: 93.7 fL (ref 80.0–100.0)
Monocytes Absolute: 0.6 10*3/uL (ref 0.1–1.0)
Monocytes Relative: 6 %
Neutro Abs: 8.7 10*3/uL — ABNORMAL HIGH (ref 1.7–7.7)
Neutrophils Relative %: 83 %
Platelets: 150 10*3/uL (ref 150–400)
RBC: 3.8 MIL/uL — ABNORMAL LOW (ref 4.22–5.81)
RDW: 15.6 % — ABNORMAL HIGH (ref 11.5–15.5)
WBC: 10.4 10*3/uL (ref 4.0–10.5)
nRBC: 0 % (ref 0.0–0.2)

## 2021-04-11 LAB — BRAIN NATRIURETIC PEPTIDE: B Natriuretic Peptide: 266.1 pg/mL — ABNORMAL HIGH (ref 0.0–100.0)

## 2021-04-11 LAB — COMPREHENSIVE METABOLIC PANEL
ALT: 23 U/L (ref 0–44)
AST: 42 U/L — ABNORMAL HIGH (ref 15–41)
Albumin: 2.8 g/dL — ABNORMAL LOW (ref 3.5–5.0)
Alkaline Phosphatase: 85 U/L (ref 38–126)
Anion gap: 9 (ref 5–15)
BUN: 25 mg/dL — ABNORMAL HIGH (ref 8–23)
CO2: 19 mmol/L — ABNORMAL LOW (ref 22–32)
Calcium: 8.7 mg/dL — ABNORMAL LOW (ref 8.9–10.3)
Chloride: 116 mmol/L — ABNORMAL HIGH (ref 98–111)
Creatinine, Ser: 2.51 mg/dL — ABNORMAL HIGH (ref 0.61–1.24)
GFR, Estimated: 24 mL/min — ABNORMAL LOW (ref >60)
Glucose, Bld: 101 mg/dL — ABNORMAL HIGH (ref 70–99)
Potassium: 3.8 mmol/L (ref 3.5–5.1)
Sodium: 144 mmol/L (ref 135–145)
Total Bilirubin: 1.2 mg/dL (ref 0.3–1.2)
Total Protein: 5.9 g/dL — ABNORMAL LOW (ref 6.5–8.1)

## 2021-04-11 LAB — URINALYSIS, COMPLETE (UACMP) WITH MICROSCOPIC
Bacteria, UA: NONE SEEN
Bilirubin Urine: NEGATIVE
Glucose, UA: NEGATIVE mg/dL
Hgb urine dipstick: NEGATIVE
Ketones, ur: NEGATIVE mg/dL
Leukocytes,Ua: NEGATIVE
Nitrite: NEGATIVE
Protein, ur: 100 mg/dL — AB
Specific Gravity, Urine: 1.025 (ref 1.005–1.030)
pH: 5.5 (ref 5.0–8.0)

## 2021-04-11 LAB — TROPONIN I (HIGH SENSITIVITY)
Troponin I (High Sensitivity): 72 ng/L — ABNORMAL HIGH (ref <18)
Troponin I (High Sensitivity): 64 ng/L — ABNORMAL HIGH (ref <18)

## 2021-04-11 LAB — RESP PANEL BY RT-PCR (FLU A&B, COVID) ARPGX2
Influenza A by PCR: NEGATIVE
Influenza B by PCR: NEGATIVE
SARS Coronavirus 2 by RT PCR: POSITIVE — AB

## 2021-04-11 LAB — LACTIC ACID, PLASMA: Lactic Acid, Venous: 1.6 mmol/L (ref 0.5–1.9)

## 2021-04-11 MED ORDER — HALOPERIDOL LACTATE 2 MG/ML PO CONC
0.5000 mg | ORAL | Status: DC | PRN
  Filled 2021-04-11: qty 0.3

## 2021-04-11 MED ORDER — MORPHINE SULFATE (CONCENTRATE) 10 MG/0.5ML PO SOLN
5.0000 mg | ORAL | Status: DC | PRN
Start: 2021-04-11 — End: 2021-04-14

## 2021-04-11 MED ORDER — MORPHINE SULFATE (CONCENTRATE) 10 MG/0.5ML PO SOLN
5.0000 mg | ORAL | Status: DC | PRN
Start: 2021-04-11 — End: 2021-04-14
  Administered 2021-04-12: 01:00:00 5 mg via ORAL
  Filled 2021-04-11: qty 0.5

## 2021-04-11 MED ORDER — GLYCOPYRROLATE 0.2 MG/ML IJ SOLN
0.2000 mg | INTRAMUSCULAR | Status: DC | PRN
  Filled 2021-04-11: qty 1

## 2021-04-11 MED ORDER — MORPHINE SULFATE (PF) 2 MG/ML IV SOLN: 1.0000 mg | INTRAVENOUS | Status: DC | PRN

## 2021-04-11 MED ORDER — CLOPIDOGREL BISULFATE 75 MG PO TABS: 75.0000 mg | ORAL_TABLET | Freq: Every day | ORAL | Status: DC

## 2021-04-11 MED ORDER — TAMSULOSIN HCL 0.4 MG PO CAPS: 0.8000 mg | ORAL_CAPSULE | Freq: Every day | ORAL | Status: DC

## 2021-04-11 MED ORDER — HYDRALAZINE HCL 20 MG/ML IJ SOLN: 10.0000 mg | Freq: Four times a day (QID) | INTRAMUSCULAR | Status: DC | PRN

## 2021-04-11 MED ORDER — LORAZEPAM 2 MG/ML IJ SOLN: 1.0000 mg | INTRAMUSCULAR | Status: DC | PRN

## 2021-04-11 MED ORDER — MAGIC MOUTHWASH: 15.0000 mL | Freq: Four times a day (QID) | ORAL | Status: DC | PRN

## 2021-04-11 MED ORDER — ACETAMINOPHEN 650 MG RE SUPP: 650.0000 mg | Freq: Four times a day (QID) | RECTAL | Status: DC | PRN

## 2021-04-11 MED ORDER — IPRATROPIUM-ALBUTEROL 0.5-2.5 (3) MG/3ML IN SOLN
3.0000 mL | Freq: Once | RESPIRATORY_TRACT | Status: AC
  Administered 2021-04-11: 3 mL via RESPIRATORY_TRACT
  Filled 2021-04-11: qty 3

## 2021-04-11 MED ORDER — HALOPERIDOL 0.5 MG PO TABS
0.5000 mg | ORAL_TABLET | ORAL | Status: DC | PRN
  Filled 2021-04-11: qty 1

## 2021-04-11 MED ORDER — ONDANSETRON HCL 4 MG/2ML IJ SOLN: 4.0000 mg | Freq: Four times a day (QID) | INTRAMUSCULAR | Status: DC | PRN

## 2021-04-11 MED ORDER — ASPIRIN 81 MG PO TBEC: 81.0000 mg | DELAYED_RELEASE_TABLET | Freq: Every day | ORAL | Status: DC

## 2021-04-11 MED ORDER — DIPHENHYDRAMINE HCL 50 MG/ML IJ SOLN: 12.5000 mg | INTRAMUSCULAR | Status: DC | PRN

## 2021-04-11 MED ORDER — IPRATROPIUM-ALBUTEROL 20-100 MCG/ACT IN AERS
2.0000 | INHALATION_SPRAY | Freq: Four times a day (QID) | RESPIRATORY_TRACT | Status: DC
  Filled 2021-04-11: qty 4

## 2021-04-11 MED ORDER — METHYLPREDNISOLONE SODIUM SUCC 125 MG IJ SOLR: 80.0000 mg | Freq: Every day | INTRAMUSCULAR | Status: DC

## 2021-04-11 MED ORDER — HALOPERIDOL LACTATE 5 MG/ML IJ SOLN: 0.5000 mg | INTRAMUSCULAR | Status: DC | PRN

## 2021-04-11 MED ORDER — FUROSEMIDE 10 MG/ML IJ SOLN
20.0000 mg | Freq: Once | INTRAMUSCULAR | Status: AC
  Administered 2021-04-11: 20 mg via INTRAVENOUS
  Filled 2021-04-11: qty 4

## 2021-04-11 MED ORDER — ACETAMINOPHEN 325 MG PO TABS: 650.0000 mg | ORAL_TABLET | Freq: Four times a day (QID) | ORAL | Status: DC | PRN

## 2021-04-11 MED ORDER — LATANOPROST 0.005 % OP SOLN: 1.0000 [drp] | Freq: Every day | OPHTHALMIC | Status: DC

## 2021-04-11 MED ORDER — FUROSEMIDE 10 MG/ML IJ SOLN: 60.0000 mg | Freq: Two times a day (BID) | INTRAMUSCULAR | Status: DC

## 2021-04-11 MED ORDER — LORAZEPAM 1 MG PO TABS: 1.0000 mg | ORAL_TABLET | ORAL | Status: DC | PRN

## 2021-04-11 MED ORDER — BIOTENE DRY MOUTH MT LIQD
15.0000 mL | OROMUCOSAL | Status: DC | PRN
  Filled 2021-04-11: qty 15

## 2021-04-11 MED ORDER — GLYCOPYRROLATE 1 MG PO TABS
1.0000 mg | ORAL_TABLET | ORAL | Status: DC | PRN
  Filled 2021-04-11: qty 1

## 2021-04-11 MED ORDER — LORAZEPAM 2 MG/ML PO CONC: 1.0000 mg | ORAL | Status: DC | PRN

## 2021-04-11 MED ORDER — METOPROLOL TARTRATE 25 MG PO TABS: 12.5000 mg | ORAL_TABLET | Freq: Two times a day (BID) | ORAL | Status: DC

## 2021-04-11 MED ORDER — AMLODIPINE BESYLATE 5 MG PO TABS: 10.0000 mg | ORAL_TABLET | Freq: Every day | ORAL | Status: DC

## 2021-04-11 MED ORDER — POLYVINYL ALCOHOL 1.4 % OP SOLN
1.0000 [drp] | Freq: Four times a day (QID) | OPHTHALMIC | Status: DC | PRN
  Filled 2021-04-11: qty 15

## 2021-04-11 MED ORDER — HEPARIN SODIUM (PORCINE) 5000 UNIT/ML IJ SOLN: 5000.0000 [IU] | Freq: Three times a day (TID) | INTRAMUSCULAR | Status: DC

## 2021-04-11 MED ORDER — ONDANSETRON HCL 4 MG PO TABS: 4.0000 mg | ORAL_TABLET | Freq: Four times a day (QID) | ORAL | Status: DC | PRN

## 2021-04-11 MED ORDER — SODIUM BICARBONATE 650 MG PO TABS: 650.0000 mg | ORAL_TABLET | Freq: Two times a day (BID) | ORAL | Status: DC

## 2021-04-11 MED ORDER — NICOTINE 21 MG/24HR TD PT24: 21.0000 mg | MEDICATED_PATCH | Freq: Every day | TRANSDERMAL | Status: DC

## 2021-04-11 MED ORDER — LORAZEPAM 2 MG/ML IJ SOLN
1.0000 mg | INTRAMUSCULAR | Status: DC | PRN
  Administered 2021-04-12: 1 mg via INTRAVENOUS
  Filled 2021-04-11: qty 1

## 2021-04-11 MED ORDER — FINASTERIDE 5 MG PO TABS: 5.0000 mg | ORAL_TABLET | Freq: Every day | ORAL | Status: DC

## 2021-04-11 MED ORDER — IPRATROPIUM-ALBUTEROL 20-100 MCG/ACT IN AERS
2.0000 | INHALATION_SPRAY | Freq: Four times a day (QID) | RESPIRATORY_TRACT | Status: DC | PRN
  Filled 2021-04-11: qty 4

## 2021-04-11 MED ORDER — FUROSEMIDE 10 MG/ML IJ SOLN: 40.0000 mg | Freq: Two times a day (BID) | INTRAMUSCULAR | Status: DC

## 2021-04-11 MED ORDER — OXYCODONE HCL 5 MG PO TABS: 5.0000 mg | ORAL_TABLET | ORAL | Status: DC | PRN

## 2021-04-11 NOTE — H&P (Signed)
History and Physical    Max Ellis [id number] DOB: 1933/11/10 DOA: 04/11/2021  PCP: Maryland Pink, MD Patient coming from: Home  I have personally briefly reviewed patient's old medical records in [place]  Chief Complaint: Encephalopathy, weakness  HPI: Max Ellis is a 87 y.o. male with medical history significant of diastolic congestive heart failure, COPD, longtime and active smoker, recent admission for COVID infection, CKD stage IV, BPH, CAD on DAPT who presents to the ED for chief complaint of progressive weakness, poor p.o. intake, altered mentation.  Patient had a recent admission at [facility] last month.  At that time treated for COVID infection and discharged to skilled nursing facility.  After discharge from skilled nursing facility patient was doing well for few days at home however the family noted that he became increasingly weak, more confused, garbled speech, decreased p.o. intake.  On my evaluation patient is in bed.  He is a frail-appearing.  Shallow respirations.  Encephalopathic with garbled speech pattern.  Appears ill.  Unable to provide history.  History obtained by speaking with the family, reading the medical record, speaking with the ED physician.  Per the family the patient has declined over the last several months.  His wife of 50+ years passed away from liver cirrhosis approximately 4 months ago.  Patient is a current daily smoker and has been smoking for 80 years.  ED Course: Presentation given encephalopathy CT head was pursued.  This is negative for CVA.  Patient had mildly elevated BNP and evidence of fluid overload on chest x-ray.  He was given 20 mg of Lasix and a DuoNeb treatment x2.  He is on room air and hemodynamically stable.  His COVID test came back positive.  No clinical indications of active COVID infection.  Review of Systems: Patient unable to provide ROS due to mental status   Past Medical History:  Diagnosis Date   COPD (chronic  obstructive pulmonary disease) ([place])    Hypertension    Peripheral vascular disease ([place])    Renal disorder    Stage 4 chronic kidney disease ([place])     History reviewed. No pertinent surgical history.   reports that he has been smoking cigarettes. He has quit using smokeless tobacco.  His smokeless tobacco use included chew. He reports that he does not drink alcohol and does not use drugs.  No Known Allergies  No family history on file. No known family history of COPD, heart failure  Prior to Admission medications   Medication Sig Start Date End Date Taking? Authorizing Provider  albuterol (VENTOLIN HFA) 108 (90 Base) MCG/ACT inhaler Inhale 2 puffs into the lungs every 6 (six) hours as needed. 03/25/21  Yes [provider]  amLODipine (NORVASC) 10 MG tablet Take 1 tablet (10 mg total) by mouth daily. Start taking half systolic BP greater than [id number] mmHg 03/21/21   Val Riles, MD  aspirin (ASPIRIN 81) 81 MG EC tablet Take 1 tablet (81 mg total) by mouth daily. 02/19/21   Patrecia Pour, MD  atorvastatin (LIPITOR) 80 MG tablet Take 1 tablet (80 mg total) by mouth daily. 02/20/21   Patrecia Pour, MD  calcitRIOL (ROCALTROL) 0.25 MCG capsule Take 0.25 mcg by mouth daily. 09/18/19   [provider]  cholecalciferol (VITAMIN D3) 25 MCG (1000 UT) tablet Take 1,000 Units by mouth daily.    [provider]  citalopram (CELEXA) 20 MG tablet Take 20 mg by mouth daily.    [provider]  clopidogrel (PLAVIX) 75 MG tablet Take 1 tablet (75 mg total) by mouth daily. 02/20/21   Patrecia Pour, MD  Cyanocobalamin (B-12) 500 MCG TABS Take 1 tablet by mouth daily. 03/21/21 06/19/21  Val Riles, MD  famotidine (PEPCID) 20 MG tablet Take 20 mg by mouth daily. 02/26/20 03/18/21  [provider]  finasteride (PROSCAR) 5 MG tablet Take 5 mg by mouth daily. 12/30/16   [provider]  furosemide (LASIX) 40 MG tablet Take 40 mg by mouth daily. 03/27/21   [provider]  Ipratropium-Albuterol (COMBIVENT) 20-100 MCG/ACT AERS respimat Inhale 1 puff into the lungs every 6 (six) hours as needed for wheezing. 03/03/21   Terrilee Croak, MD  iron polysaccharides (NIFEREX) 150 MG capsule Take 1 capsule (150 mg total) by mouth daily. 03/21/21 06/19/21  Val Riles, MD  latanoprost (XALATAN) 0.005 % ophthalmic solution Place 1 drop into both eyes at bedtime. 02/15/20   [provider]  metoprolol tartrate (LOPRESSOR) 25 MG tablet Take 0.5 tablets (12.5 mg total) by mouth 2 (two) times daily. Skip dose if systolic BP less than [id number] mmHg and or HR less than 65 03/21/21   Val Riles, MD  omeprazole (PRILOSEC) 20 MG capsule Take 20 mg by mouth at bedtime.    [provider]  sodium bicarbonate 650 MG tablet Take 1 tablet (650 mg total) by mouth 2 (two) times daily. 03/21/21 04/20/21  Val Riles, MD  tamsulosin (FLOMAX) 0.4 MG CAPS capsule Take 0.8 mg by mouth daily.    [provider]  traZODone (DESYREL) 150 MG tablet Take 150 mg by mouth at bedtime.    [provider]  vitamin C (ASCORBIC ACID) 500 MG tablet Take 0.5 tablets (250 mg total) by mouth daily. 03/21/21 06/19/21  Val Riles, MD    Physical Exam: Vitals:   04/11/21 1330 04/11/21 1340 04/11/21 1458 04/11/21 1509  BP: (!) 165/58  (!) 182/61   Pulse: (!) 51 (!) 51 65   Resp: '16 12 16   '$ Temp:    98.5 F (36.9 C)  TempSrc:    Axillary  SpO2: 100% 100% 98%      Vitals:   04/11/21 1330 04/11/21 1340 04/11/21 1458 04/11/21 1509  BP: (!) 165/58  (!) 182/61   Pulse: (!) 51 (!) 51 65   Resp: '16 12 16   '$ Temp:    98.5 F (36.9 C)  TempSrc:    Axillary  SpO2: 100% 100% 98%    Constitutional: Resting in bed.  Ill-appearing.  Appears frail ENMT: Mucous membranes are dry. Posterior pharynx clear of any exudate or lesions.poor dentition.  Respiratory: Bilateral scattered rhonchi and wheezes.  Normal work of breathing.  Room air Cardiovascular: S1-S2, RRR, 2 out  of 6 murmur, 2+ pedal pulses, no pitting edema Abdomen: Thin, nontender, nondistended, normal bowel sounds Musculoskeletal: Mild clubbing, no cyanosis, no joint deformity, limited range of motion Skin: Thin and pale.  No obvious rashes or lesions Neurologic: Cranial nerves grossly intact, sensation intact, decree strength diffusely Psychiatric: Impaired judgment and insight. Alert and oriented x 1.  Flattened mood.    Labs on Admission: I have personally reviewed following labs and imaging studies  CBC: Recent Labs  Lab 04/11/21 1303  WBC 10.4  NEUTROABS 8.7*  HGB 11.7*  HCT 35.6*  MCV 93.7  PLT [id number]   Basic Metabolic Panel: Recent Labs  Lab 04/11/21 1303  NA 144  K 3.8  CL 116*  CO2 19*  GLUCOSE 101*  BUN 25*  CREATININE 2.51*  CALCIUM 8.7*   GFR: CrCl cannot be calculated (Unknown ideal weight.). Liver Function Tests: Recent Labs  Lab 04/11/21 1303  AST 42*  ALT 23  ALKPHOS 85  BILITOT 1.2  PROT 5.9*  ALBUMIN 2.8*   No results for input(s): LIPASE, AMYLASE in the last 168 hours. No results for input(s): AMMONIA in the last 168 hours. Coagulation Profile: No results for input(s): INR, PROTIME in the last 168 hours. Cardiac Enzymes: No results for input(s): CKTOTAL, CKMB, CKMBINDEX, TROPONINI in the last 168 hours. BNP (last 3 results) No results for input(s): PROBNP in the last 8760 hours. HbA1C: No results for input(s): HGBA1C in the last 72 hours. CBG: No results for input(s): GLUCAP in the last 168 hours. Lipid Profile: No results for input(s): CHOL, HDL, LDLCALC, TRIG, CHOLHDL, LDLDIRECT in the last 72 hours. Thyroid Function Tests: No results for input(s): TSH, T4TOTAL, FREET4, T3FREE, THYROIDAB in the last 72 hours. Anemia Panel: No results for input(s): VITAMINB12, FOLATE, FERRITIN, TIBC, IRON, RETICCTPCT in the last 72 hours. Urine analysis:    Component Value Date/Time   COLORURINE YELLOW (A) 03/18/2021 1638   APPEARANCEUR CLEAR (A)  03/18/2021 1638   APPEARANCEUR Clear 01/02/2021 1358   LABSPEC 1.014 03/18/2021 1638   PHURINE 5.0 03/18/2021 1638   GLUCOSEU NEGATIVE 03/18/2021 1638   HGBUR NEGATIVE 03/18/2021 [place] 03/18/2021 1638   BILIRUBINUR Negative 01/02/2021 [place] 03/18/2021 1638   PROTEINUR NEGATIVE 03/18/2021 1638   NITRITE NEGATIVE 03/18/2021 [place] 03/18/2021 1638    Radiological Exams on Admission: DG Chest 2 View  Result Date: 04/11/2021 CLINICAL DATA:  Shortness of breath. EXAM: CHEST - 2 VIEW COMPARISON:  03/18/2021 FINDINGS: Stable cardiomediastinal contours. Persistent bilateral pleural effusions and mild interstitial edema concerning for CHF. Right apical pleuroparenchymal scarring as noted on recent chest CT is again seen and appears unchanged. No superimposed airspace consolidation. IMPRESSION: Pleural effusions and mild interstitial edema compatible with CHF. Electronically Signed   By: Kerby Moors M.D.   On: 04/11/2021 13:27   CT HEAD WO CONTRAST (5MM)  Result Date: 04/11/2021 CLINICAL DATA:  Altered mental status EXAM: CT HEAD WITHOUT CONTRAST TECHNIQUE: Contiguous axial images were obtained from the base of the skull through the vertex without intravenous contrast. COMPARISON:  CT head 02/19/2021 FINDINGS: Brain: There is no evidence of acute intracranial hemorrhage, extra-axial fluid collection, or infarct. There is moderate global parenchymal volume loss with enlargement of the ventricular system, similar to the prior study. 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 Extensive hypodensity throughout the remainder of the subcortical and periventricular white matter likely reflects advanced chronic white matter microangiopathy. There is no mass lesion. There is no midline shift. Vascular: There is calcification of the bilateral cavernous ICAs and vertebral arteries. Skull: Normal.  Negative for fracture or focal lesion. Sinuses/Orbits: The imaged paranasal sinuses are clear. A right lens implant is noted. The globes and orbits are otherwise unremarkable. Other: There is a small right mastoid effusion not seen on the prior study. IMPRESSION: 1. No acute intracranial pathology. 2. 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 3. New small left mastoid effusion. Electronically Signed   By: Valetta Mole M.D.   On: 04/11/2021 16:06    EKG: Independently reviewed.  Sinus bradycardia  Assessment/Plan Active Problems:   Acute decompensated heart  failure (HCC)   Acute metabolic encephalopathy Poor p.o. intake, suspect severe protein calorie malnutrition Inability to ambulate Adult failure to thrive Patient has had a significant decline over the past several months.  Recent recurrent hospital admissions.  Recent skilled nursing facility stay, COVID infection.  Now with adult failure to thrive associated with inability to ambulate, metabolic encephalopathy, poor p.o. intake.  Patient with several advanced comorbidities. Plan: After lengthy discussion with family I made a recommendation for transition to comfort measures and hospice referral.  Spoke to family at length.  They will confer and get back to me with their decision.  In the meantime patient is a DNR.  We will attempt to optimize medically but will not escalate care.  Acute decompensated diastolic congestive heart failure Evidence of edema on chest x-ray and mildly elevated BNP Recent echocardiogram in July, will not repeat Plan: Admit to PCU Lasix 60 mg IV twice daily Continue home beta-blocker Cannot add ACE inhibitor due to kidney function Daily weights Strict I's and O's  COPD Difficult to exclude exacerbation Patient with wheezing Plan: Bronchodilators Empiric IV steroids Oxygen if necessary Can likely  de-escalate/discontinue steroids in 24 hours  Chronic kidney disease stage IV Creatinine at or near baseline Actually improved from previous admission Monitor BMP  Coronary artery disease Resume dual antiplatelet therapy  Essential hypertension PTA Norvasc metoprolol  BPH PTA Proscar and Flomax  Chronic metabolic acidosis PTA sodium bicarb 650 mg twice daily    DVT prophylaxis: SQ heparin Code Status: DNR Family Communication: Daughter [name] [phone] via phone on 9/9 Disposition Plan: Anticipate transition to comfort measures versus discharge to skilled nursing facility versus hospice facility Consults called: Palliative care Admission status: PCU, inpatient   Sidney Ace MD [facility]   If 7PM-7AM, please contact night-coverage   04/11/2021, 5:02 PM

## 2021-04-11 NOTE — ED Provider Notes (Signed)
[name]  ____________________________________________   Event Date/Time   First MD Initiated Contact with Patient 04/11/21 1229     (approximate)  I have reviewed the triage vital signs and the nursing notes.   HISTORY  Chief Complaint Shortness of Breath   HPI Max Ellis is a 87 y.o. male with stage IV kidney disease and COPD who had a pneumonia and COVID several weeks ago.  He was in the hospital got better went to rehab and was sent home and was doing well at home but in the last 2 to 3 days he has become very short of breath.  Today he is so short of breath he has difficulty walking.  He is a smoker.  He is still smoking.  He is not having any chest pain.  He is coughing up some clear phlegm. ----------------------------------------- 3:15 PM on 04/11/2021 -----------------------------------------  [name] comes and I speak to him.  Grandson reports the patient is having increasing confusion starting a few days ago and getting worse every day.  His speech is become unintelligible now.  He is having a lot of difficulty walking this is all new in the last few days.        Past Medical History:  Diagnosis Date   COPD (chronic obstructive pulmonary disease) ([place])    Hypertension    Peripheral vascular disease ([place])    Renal disorder    Stage 4 chronic kidney disease ([place])     Patient Active Problem List   Diagnosis Date Noted   Acute anemia 03/18/2021   Fall 02/19/2021   Acute lower UTI 02/19/2021   COVID-19 virus infection 02/18/2021   NSTEMI (non-ST elevated myocardial infarction) ([place]) 02/17/2021   Hypertensive urgency 02/16/2021   Acute urinary retention 02/16/2021   Chronic kidney disease, stage 4 (severe) ([place]) 02/16/2021   Benign prostatic hyperplasia with incomplete bladder emptying 03/03/2020   Essential hypertension 01/26/2017   Tobacco use disorder 01/26/2017   Pain in limb 01/26/2017     History reviewed. No pertinent surgical history.  Prior to Admission medications   Medication Sig Start Date End Date Taking? Authorizing Provider  amLODipine (NORVASC) 10 MG tablet Take 1 tablet (10 mg total) by mouth daily. Start taking half systolic BP greater than [id number] mmHg 03/21/21   Val Riles, MD  aspirin (ASPIRIN 81) 81 MG EC tablet Take 1 tablet (81 mg total) by mouth daily. 02/19/21   Patrecia Pour, MD  atorvastatin (LIPITOR) 80 MG tablet Take 1 tablet (80 mg total) by mouth daily. 02/20/21   Patrecia Pour, MD  calcitRIOL (ROCALTROL) 0.25 MCG capsule Take 0.25 mcg by mouth daily. 09/18/19   [provider]  cholecalciferol (VITAMIN D3) 25 MCG (1000 UT) tablet Take 1,000 Units by mouth daily.    [provider]  citalopram (CELEXA) 20 MG tablet Take 20 mg by mouth daily.    [provider]  clopidogrel (PLAVIX) 75 MG tablet Take 1 tablet (75 mg total) by mouth daily. 02/20/21   Patrecia Pour, MD  Cyanocobalamin (B-12) 500 MCG TABS Take 1 tablet by mouth daily. 03/21/21 06/19/21  Val Riles, MD  famotidine (PEPCID) 20 MG tablet Take 20 mg by mouth daily. 02/26/20 03/18/21  [provider]  finasteride (PROSCAR) 5 MG tablet Take 5 mg by mouth daily. 12/30/16   [provider]  Ipratropium-Albuterol (COMBIVENT) 20-100 MCG/ACT AERS respimat Inhale 1 puff into the lungs every 6 (six) hours as  needed for wheezing. 03/03/21   Terrilee Croak, MD  iron polysaccharides (NIFEREX) 150 MG capsule Take 1 capsule (150 mg total) by mouth daily. 03/21/21 06/19/21  Val Riles, MD  latanoprost (XALATAN) 0.005 % ophthalmic solution Place 1 drop into both eyes at bedtime. 02/15/20   [provider]  metoprolol tartrate (LOPRESSOR) 25 MG tablet Take 0.5 tablets (12.5 mg total) by mouth 2 (two) times daily. Skip dose if systolic BP less than [id number] mmHg and or HR less than 65 03/21/21   Val Riles, MD  omeprazole (PRILOSEC) 20 MG capsule Take 20 mg by mouth at  bedtime.    [provider]  sodium bicarbonate 650 MG tablet Take 1 tablet (650 mg total) by mouth 2 (two) times daily. 03/21/21 04/20/21  Val Riles, MD  tamsulosin (FLOMAX) 0.4 MG CAPS capsule Take 0.8 mg by mouth daily.    [provider]  traZODone (DESYREL) 150 MG tablet Take 150 mg by mouth at bedtime.    [provider]  vitamin C (ASCORBIC ACID) 500 MG tablet Take 0.5 tablets (250 mg total) by mouth daily. 03/21/21 06/19/21  Val Riles, MD    Allergies Patient has no known allergies.  No family history on file.  Social History Social History   Tobacco Use   Smoking status: Every Day    Types: Cigarettes   Smokeless tobacco: Former    Types: Chew  Substance Use Topics   Alcohol use: No   Drug use: No    Review of Systems  Constitutional: No fever/chills Eyes: No visual changes. ENT: No sore throat. Cardiovascular: Denies chest pain. Respiratory: shortness of breath. Gastrointestinal: No abdominal pain.  No nausea, no vomiting.  No diarrhea.  No constipation. Genitourinary: Negative for dysuria. Musculoskeletal: Negative for back pain. Skin: Negative for rash. Neurological: Negative for headaches, focal weakness   ____________________________________________   PHYSICAL EXAM:  VITAL SIGNS: ED Triage Vitals [04/11/21 1226]  Enc Vitals Group     BP      Pulse      Resp      Temp      Temp src      SpO2      Weight      Height      Head Circumference      Peak Flow      Pain Score 0     Pain Loc      Pain Edu?      Excl. in [place]?     Constitutional: Alert and oriented.  Appears chronically ill and is short of breath Eyes: Conjunctivae are normal.  Head: Atraumatic. Nose: No congestion/rhinnorhea. Mouth/Throat: Mucous membranes are moist.  Oropharynx non-erythematous. Neck: No stridor.  Cardiovascular: Normal rate, regular rhythm. Grossly normal heart sounds.  Good peripheral circulation. Respiratory: Normal  respiratory effort.  No retractions. Lungs CTAB. Gastrointestinal: Soft and nontender.  Scaphoid no distention. No abdominal bruits.  Musculoskeletal: No lower extremity tenderness nor edema.  No joint effusions. Neurologic:  Normal speech and language. No gross focal neurologic deficits are appreciated.  gait instability. Skin:  Skin is warm, dry and intact. No rash noted.   ____________________________________________   LABS (all labs ordered are listed, but only abnormal results are displayed)  Labs Reviewed  RESP PANEL BY RT-PCR (FLU A&B, COVID) ARPGX2 - Abnormal; Notable for the following components:      Result Value   SARS Coronavirus 2 by RT PCR POSITIVE (*)    All other components within normal limits  COMPREHENSIVE METABOLIC PANEL - Abnormal; Notable for the following components:   Chloride 116 (*)    CO2 19 (*)    Glucose, Bld 101 (*)    BUN 25 (*)    Creatinine, Ser 2.51 (*)    Calcium 8.7 (*)    Total Protein 5.9 (*)    Albumin 2.8 (*)    AST 42 (*)    GFR, Estimated 24 (*)    All other components within normal limits  BRAIN NATRIURETIC PEPTIDE - Abnormal; Notable for the following components:   B Natriuretic Peptide 266.1 (*)    All other components within normal limits  CBC WITH DIFFERENTIAL/PLATELET - Abnormal; Notable for the following components:   RBC 3.80 (*)    Hemoglobin 11.7 (*)    HCT 35.6 (*)    RDW 15.6 (*)    Neutro Abs 8.7 (*)    All other components within normal limits  TROPONIN I (HIGH SENSITIVITY) - Abnormal; Notable for the following components:   Troponin I (High Sensitivity) 72 (*)    All other components within normal limits  TROPONIN I (HIGH SENSITIVITY) - Abnormal; Notable for the following components:   Troponin I (High Sensitivity) 64 (*)    All other components within normal limits  LACTIC ACID, PLASMA  URINALYSIS, COMPLETE (UACMP) WITH MICROSCOPIC   ____________________________________________  EKG  EKG read interpreted by  me shows sinus bradycardia rate of 53 left axis no acute ST-T wave changes ____________________________________________  RADIOLOGY Gertha Calkin, personally viewed and evaluated these images (plain radiographs) as part of my medical decision making, as well as reviewing the written report by the radiologist.  ED MD interpretation: Chest x-ray reviewed by me looks similar to 1 from August.  Official radiology report(s): DG Chest 2 View  Result Date: 04/11/2021 CLINICAL DATA:  Shortness of breath. EXAM: CHEST - 2 VIEW COMPARISON:  03/18/2021 FINDINGS: Stable cardiomediastinal contours. Persistent bilateral pleural effusions and mild interstitial edema concerning for CHF. Right apical pleuroparenchymal scarring as noted on recent chest CT is again seen and appears unchanged. No superimposed airspace consolidation. IMPRESSION: Pleural effusions and mild interstitial edema compatible with CHF. Electronically Signed   By: Kerby Moors M.D.   On: 04/11/2021 13:27   CT HEAD WO CONTRAST (5MM)  Result Date: 04/11/2021 CLINICAL DATA:  Altered mental status EXAM: CT HEAD WITHOUT CONTRAST TECHNIQUE: Contiguous axial images were obtained from the base of the skull through the vertex without intravenous contrast. COMPARISON:  CT head 02/19/2021 FINDINGS: Brain: There is no evidence of acute intracranial hemorrhage, extra-axial fluid collection, or infarct. There is moderate global parenchymal volume loss with enlargement of the ventricular system, similar to the prior study. 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 Extensive hypodensity throughout the remainder of the subcortical and periventricular white matter likely reflects advanced chronic white matter microangiopathy. There is no mass lesion. There is no midline shift. Vascular: There is calcification of the bilateral cavernous ICAs and vertebral arteries. Skull: Normal. Negative for fracture or focal  lesion. Sinuses/Orbits: The imaged paranasal sinuses are clear. A right lens implant is noted. The globes and orbits are otherwise unremarkable. Other: There is a small right mastoid effusion not seen on the prior study. IMPRESSION: 1. No acute intracranial pathology. 2. 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 3. New small left mastoid effusion. Electronically Signed   By: Collier [place]ina  Noone M.D.   On: 04/11/2021 16:06    ____________________________________________   PROCEDURES  Procedure(s) performed (including Critical Care):  Procedures   ____________________________________________   INITIAL IMPRESSION / ASSESSMENT AND PLAN / ED COURSE  ----------------------------------------- 3:15 PM on 04/11/2021 ----------------------------------------- Attempted to get patient to walk but he is completely unable to walk.  And with a walker and 2 people helping.    ----------------------------------------- 4:13 PM on 04/11/2021 ----------------------------------------- CT returned showing no acute stroke.  There is some fluid around the mastoid but the patient does not have any tenderness swelling in the area.          ____________________________________________   FINAL CLINICAL IMPRESSION(S) / ED DIAGNOSES  Final diagnoses:  Altered mental status, unspecified altered mental status type  Difficulty walking     ED Discharge Orders     None        Note:  This document was prepared using Dragon voice recognition software and may include unintentional dictation errors.    Nena Polio, MD 04/11/21 [phone]

## 2021-04-11 NOTE — Care Management (Signed)
Received message from bedside RN.  Family at bedside, would like to transition to full comfort measures.  Plan: All medications focused on patient comfort DNR Stop labs Stop routine vitals DC telemetry DNR Comfort feeds TOC consult for hospice referral  Ralene Muskrat MD

## 2021-04-11 NOTE — ED Triage Notes (Signed)
Pt comes via EMS from home with c/o SOB. Family reports it has gotten worse over the last few days.  EMs reports congestion. Pt recently referred to hospice.  98%RA BP-148/68 HR-56

## 2021-04-11 NOTE — ED Notes (Signed)
Request made for transport to the floor ?

## 2021-04-12 DIAGNOSIS — R627 Adult failure to thrive: Secondary | ICD-10-CM

## 2021-04-12 MED ORDER — ORAL CARE MOUTH RINSE
15.0000 mL | Freq: Two times a day (BID) | OROMUCOSAL | Status: DC
  Administered 2021-04-12 – 2021-04-14 (×5): 15 mL via OROMUCOSAL

## 2021-04-12 MED ORDER — TAMSULOSIN HCL 0.4 MG PO CAPS
0.4000 mg | ORAL_CAPSULE | Freq: Every day | ORAL | Status: DC
  Administered 2021-04-12 – 2021-04-14 (×3): 0.4 mg via ORAL
  Filled 2021-04-12 (×3): qty 1

## 2021-04-12 MED ORDER — FINASTERIDE 5 MG PO TABS
5.0000 mg | ORAL_TABLET | Freq: Every day | ORAL | Status: DC
  Administered 2021-04-12 – 2021-04-14 (×3): 5 mg via ORAL
  Filled 2021-04-12 (×3): qty 1

## 2021-04-12 NOTE — Care Management (Signed)
TOC consulted.  Hospice liason to evaluate patient for admission to [facility] Monday 04/14/21  Ralene Muskrat MD

## 2021-04-12 NOTE — Evaluation (Signed)
Clinical/Bedside Swallow Evaluation Patient Details  Name: Max Ellis MRN: [id number] Date of Birth: March 13, 1934  Today's Date: 04/12/2021 Time: SLP Start Time (ACUTE ONLY): 20 SLP Stop Time (ACUTE ONLY): 1200 SLP Time Calculation (min) (ACUTE ONLY): 30 min  Past Medical History:  Past Medical History:  Diagnosis Date   COPD (chronic obstructive pulmonary disease) ([place])    Hypertension    Peripheral vascular disease ([place])    Renal disorder    Stage 4 chronic kidney disease ([place])    Past Surgical History: History reviewed. No pertinent surgical history. HPI:  Per admitting H&P " Max Ellis is a 87 y.o. male with medical history significant of diastolic congestive heart failure, COPD, longtime and active smoker, recent admission for COVID infection, CKD stage IV, BPH, CAD on DAPT who presents to the ED for chief complaint of progressive weakness, poor p.o. intake, altered mentation.     Patient had a recent admission at [facility] last month.  At that time treated for COVID infection and discharged to skilled nursing facility.  After discharge from skilled nursing facility patient was doing well for few days at home however the family noted that he became increasingly weak, more confused, garbled speech, decreased p.o. intake.     On my evaluation patient is in bed.  He is a frail-appearing.  Shallow respirations.  Encephalopathic with garbled speech pattern.  Appears ill.  Unable to provide history.  History obtained by speaking with the family, reading the medical record, speaking with the ED physician.     Per the family the patient has declined over the last several months.  His wife of 50+ years passed away from liver cirrhosis approximately 4 months ago.  Patient is a current daily smoker and has been smoking for 80 years."   Assessment / Plan / Recommendation Clinical Impression  Pt presents with mild to moderate dysphagia. ST consulted to assist with diet as Pt has transitioned to  comfort care status. Today, Pt was resting in bed with several family members present. Pt awakwned easily to take PPos for assessemtn. Pt toelrted all consistencies without s/s of aspiration. Needed extended time to maticate solids secondary to lethargy and poor dentition. Pt was able to clear oral cavity when alternating liquids with solids. Noted fatigue and SOB with extended chewing. Family reports poor PO's over past few days and the need for "soft' foods. Rec Dys 2 chopped diet for ease of chewing. Also rec family continue to provide foods from out of facility that they think Pt would enjoy. ST to follow up 2-3 days with family to see that new diet is appropriate for Pt. Family is very supportive and agreeable to recs. SLP Visit Diagnosis: Dysphagia, unspecified (R13.10)    Aspiration Risk  Mild aspiration risk    Diet Recommendation Dysphagia 2 (Fine chop);Thin liquid   Liquid Administration via: Straw Medication Administration: Whole meds with puree Supervision: Staff to assist with self feeding Compensations: Other (Comment) (allow periods of rest) Postural Changes: Seated upright at 90 degrees;Remain upright for at least 30 minutes after po intake    Other  Recommendations     Follow up Recommendations Other (comment) (comfort care)      Frequency and Duration   F/u x1         Prognosis   Fair, comfort care     Swallow Study   General Date of Onset: 04/11/21 HPI: Per admitting H&P " Max Ellis is a 87 y.o. male with medical  history significant of diastolic congestive heart failure, COPD, longtime and active smoker, recent admission for COVID infection, CKD stage IV, BPH, CAD on DAPT who presents to the ED for chief complaint of progressive weakness, poor p.o. intake, altered mentation.     Patient had a recent admission at [facility] last month.  At that time treated for COVID infection and discharged to skilled nursing facility.  After discharge from skilled nursing facility  patient was doing well for few days at home however the family noted that he became increasingly weak, more confused, garbled speech, decreased p.o. intake.     On my evaluation patient is in bed.  He is a frail-appearing.  Shallow respirations.  Encephalopathic with garbled speech pattern.  Appears ill.  Unable to provide history.  History obtained by speaking with the family, reading the medical record, speaking with the ED physician.     Per the family the patient has declined over the last several months.  His wife of 50+ years passed away from liver cirrhosis approximately 4 months ago.  Patient is a current daily smoker and has been smoking for 80 years." Type of Study: Bedside Swallow Evaluation Diet Prior to this Study: Regular Temperature Spikes Noted: No Respiratory Status: Room air History of Recent Intubation: No Behavior/Cognition: Cooperative;Lethargic/Drowsy Oral Cavity Assessment: Within Functional Limits Oral Cavity - Dentition: Poor condition;Missing dentition Self-Feeding Abilities: Needs assist Patient Positioning: Upright in bed Baseline Vocal Quality: Hoarse;Low vocal intensity    Oral/Motor/Sensory Function Overall Oral Motor/Sensory Function: Within functional limits   Ice Chips Ice chips: Not tested   Thin Liquid Thin Liquid: Within functional limits Presentation: Straw;Spoon    Nectar Thick Nectar Thick Liquid: Not tested   Honey Thick Honey Thick Liquid: Not tested   Puree Puree: Within functional limits Presentation: Spoon   Solid     Solid: Impaired Oral Phase Impairments: Impaired mastication;Poor awareness of bolus Oral Phase Functional Implications: Impaired mastication;Oral residue      Lucila Maine 04/12/2021,12:02 PM

## 2021-04-12 NOTE — Progress Notes (Signed)
PROGRESS NOTE    VIGO TANSKI  [id number] DOB: 1933-10-11 DOA: 04/11/2021 PCP: Maryland Pink, MD    Brief Narrative: 87 y.o. male with medical history significant of diastolic congestive heart failure, COPD, longtime and active smoker, recent admission for COVID infection, CKD stage IV, BPH, CAD on DAPT who presents to the ED for chief complaint of progressive weakness, poor p.o. intake, altered mentation.   Patient had a recent admission at [facility] last month.  At that time treated for COVID infection and discharged to skilled nursing facility.  After discharge from skilled nursing facility patient was doing well for few days at home however the family noted that he became increasingly weak, more confused, garbled speech, decreased p.o. intake.   On my evaluation patient is in bed.  He is a frail-appearing.  Shallow respirations.  Encephalopathic with garbled speech pattern.  Appears ill.  Unable to provide history.  History obtained by speaking with the family, reading the medical record, speaking with the ED physician.   Per the family the patient has declined over the last several months.  His wife of 50+ years passed away from liver cirrhosis approximately 4 months ago.  I discussed patient status and plan of care with patient's daughter over the phone.  After our conversation the family elected to proceed with full comfort measures.  All medications have been focused on patient comfort.   Assessment & Plan:   Active Problems:   Acute decompensated heart failure (HCC)   Adult failure to thrive  Acute metabolic encephalopathy Poor p.o. intake, suspect severe protein calorie malnutrition Inability to ambulate Adult failure to thrive Patient has had a significant decline over the past several months.  Recent recurrent hospital admissions.  Recent skilled nursing facility stay, COVID infection.  Now with adult failure to thrive associated with inability to ambulate, metabolic  encephalopathy, poor p.o. intake.  Patient with several advanced comorbidities. Plan: Patient has been transitioned to full comfort measures.  DNR status.  As needed morphine, Ativan, Robinul, Haldol.  Discontinue labs.  Discontinue telemetry monitoring.   Acute decompensated diastolic congestive heart failure  COPD Chronic kidney disease stage IV Coronary artery disease Essential hypertension BPH Chronic metabolic acidosis   DVT prophylaxis: None, comfort care Code Status: DNR Family Communication: Left VM for daughter [name] [phone] on 9/10.  Spoke on the phone 9/9 Disposition Plan: Status is: Inpatient  Remains inpatient appropriate because:Altered mental status, Unsafe d/c plan, IV treatments appropriate due to intensity of illness or inability to take PO, and Inpatient level of care appropriate due to severity of illness  Dispo: The patient is from: Home              Anticipated d/c is to:  Home hospice versus inpatient hospice facility              Patient currently is not medically stable to d/c.   Difficult to place patient No       Level of care: Med-Surg  Consultants:  None  Procedures:  None  Antimicrobials:  None   Subjective: Seen and examined.  Resting comfortably in bed.  No visible distress  Objective: Vitals:   04/12/21 0018 04/12/21 0019 04/12/21 0600 04/12/21 0828  BP:  (!) 193/71 (!) 144/83 (!) 148/54  Pulse:  69 71 63  Resp:  '18 18 18  '$ Temp:  97.9 F (36.6 C) 97.9 F (36.6 C) (!) 97.1 F (36.2 C)  TempSrc:      SpO2:  99% 98%  96%  Weight: 62.4 kg     Height: '6\' 2"'$  (1.88 m)       Intake/Output Summary (Last 24 hours) at 04/12/2021 [id number] Last data filed at 04/11/2021 2000 Gross per 24 hour  Intake --  Output 200 ml  Net -200 ml   Filed Weights   04/12/21 0018  Weight: 62.4 kg    Examination:  Limited exam due to comfort measure status  General exam: Sleeping in bed.  No visible distress.  Frail Respiratory system:  Bilateral crackles.  Normal work of breathing.  Room air Cardiovascular system: S1-S2, RRR, no murmurs Gastrointestinal system: Thin, nontender, nondistended, normal bowel sounds    Data Reviewed: I have personally reviewed following labs and imaging studies  CBC: Recent Labs  Lab 04/11/21 1303  WBC 10.4  NEUTROABS 8.7*  HGB 11.7*  HCT 35.6*  MCV 93.7  PLT [id number]   Basic Metabolic Panel: Recent Labs  Lab 04/11/21 1303  NA 144  K 3.8  CL 116*  CO2 19*  GLUCOSE 101*  BUN 25*  CREATININE 2.51*  CALCIUM 8.7*   GFR: Estimated Creatinine Clearance: 19 mL/min (A) (by C-G formula based on SCr of 2.51 mg/dL (H)). Liver Function Tests: Recent Labs  Lab 04/11/21 1303  AST 42*  ALT 23  ALKPHOS 85  BILITOT 1.2  PROT 5.9*  ALBUMIN 2.8*   No results for input(s): LIPASE, AMYLASE in the last 168 hours. No results for input(s): AMMONIA in the last 168 hours. Coagulation Profile: No results for input(s): INR, PROTIME in the last 168 hours. Cardiac Enzymes: No results for input(s): CKTOTAL, CKMB, CKMBINDEX, TROPONINI in the last 168 hours. BNP (last 3 results) No results for input(s): PROBNP in the last 8760 hours. HbA1C: No results for input(s): HGBA1C in the last 72 hours. CBG: No results for input(s): GLUCAP in the last 168 hours. Lipid Profile: No results for input(s): CHOL, HDL, LDLCALC, TRIG, CHOLHDL, LDLDIRECT in the last 72 hours. Thyroid Function Tests: No results for input(s): TSH, T4TOTAL, FREET4, T3FREE, THYROIDAB in the last 72 hours. Anemia Panel: No results for input(s): VITAMINB12, FOLATE, FERRITIN, TIBC, IRON, RETICCTPCT in the last 72 hours. Sepsis Labs: Recent Labs  Lab 04/11/21 1303  LATICACIDVEN 1.6    Recent Results (from the past 240 hour(s))  Resp Panel by RT-PCR (Flu A&B, Covid) Nasopharyngeal Swab     Status: Abnormal   Collection Time: 04/11/21  1:03 PM   Specimen: Nasopharyngeal Swab; Nasopharyngeal(NP) swabs in vial transport medium   Result Value Ref Range Status   SARS Coronavirus 2 by RT PCR POSITIVE (A) NEGATIVE Final    Comment: RESULT CALLED TO, READ BACK BY AND VERIFIED WITH: LAURA STEINFIELD 1423 [id number] (NOTE) SARS-CoV-2 target nucleic acids are DETECTED.  The SARS-CoV-2 RNA is generally detectable in upper respiratory specimens during the acute phase of infection. Positive results are indicative of the presence of the identified virus, but do not rule out bacterial infection or co-infection with other pathogens not detected by the test. Clinical correlation with patient history and other diagnostic information is necessary to determine patient infection status. The expected result is Negative.  Fact Sheet for Patients: EntrepreneurPulse.com.au  Fact Sheet for Healthcare Providers: IncredibleEmployment.be  This test is not yet approved or cleared by the [place] FDA and  has been authorized for detection and/or diagnosis of SARS-CoV-2 by FDA under an Emergency Use Authorization (EUA).  This EUA will remain in effect (meaning this test can be u sed) for the duration  of  the COVID-19 declaration under Section 564(b)(1) of the Act, 21 U.S.C. section 360bbb-3(b)(1), unless the authorization is terminated or revoked sooner.     Influenza A by PCR NEGATIVE NEGATIVE Final   Influenza B by PCR NEGATIVE NEGATIVE Final    Comment: (NOTE) The Xpert Xpress SARS-CoV-2/FLU/RSV plus assay is intended as an aid in the diagnosis of influenza from Nasopharyngeal swab specimens and should not be used as a sole basis for treatment. Nasal washings and aspirates are unacceptable for Xpert Xpress SARS-CoV-2/FLU/RSV testing.  Fact Sheet for Patients: EntrepreneurPulse.com.au  Fact Sheet for Healthcare Providers: IncredibleEmployment.be  This test is not yet approved or cleared by the [place] FDA and has been authorized for detection  and/or diagnosis of SARS-CoV-2 by FDA under an Emergency Use Authorization (EUA). This EUA will remain in effect (meaning this test can be used) for the duration of the COVID-19 declaration under Section 564(b)(1) of the Act, 21 U.S.C. section 360bbb-3(b)(1), unless the authorization is terminated or revoked.  Performed at [facility], [address], [place], [place] [address]          Radiology Studies: DG Chest 2 View  Result Date: 04/11/2021 CLINICAL DATA:  Shortness of breath. EXAM: CHEST - 2 VIEW COMPARISON:  03/18/2021 FINDINGS: Stable cardiomediastinal contours. Persistent bilateral pleural effusions and mild interstitial edema concerning for CHF. Right apical pleuroparenchymal scarring as noted on recent chest CT is again seen and appears unchanged. No superimposed airspace consolidation. IMPRESSION: Pleural effusions and mild interstitial edema compatible with CHF. Electronically Signed   By: Kerby Moors M.D.   On: 04/11/2021 13:27   CT HEAD WO CONTRAST (5MM)  Result Date: 04/11/2021 CLINICAL DATA:  Altered mental status EXAM: CT HEAD WITHOUT CONTRAST TECHNIQUE: Contiguous axial images were obtained from the base of the skull through the vertex without intravenous contrast. COMPARISON:  CT head 02/19/2021 FINDINGS: Brain: There is no evidence of acute intracranial hemorrhage, extra-axial fluid collection, or infarct. There is moderate global parenchymal volume loss with enlargement of the ventricular system, similar to the prior study. 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 Extensive hypodensity throughout the remainder of the subcortical and periventricular white matter likely reflects advanced chronic white matter microangiopathy. There is no mass lesion. There is no midline shift. Vascular: There is calcification of the bilateral cavernous ICAs and vertebral arteries. Skull: Normal. Negative for fracture or focal  lesion. Sinuses/Orbits: The imaged paranasal sinuses are clear. A right lens implant is noted. The globes and orbits are otherwise unremarkable. Other: There is a small right mastoid effusion not seen on the prior study. IMPRESSION: 1. No acute intracranial pathology. 2. 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 3. New small left mastoid effusion. Electronically Signed   By: Valetta Mole M.D.   On: 04/11/2021 16:06        Scheduled Meds: Continuous Infusions:   LOS: 1 day    Time spent: 25 minutes    Sidney Ace, MD [facility] Pager [phone]  If 7PM-7AM, please contact night-coverage 04/12/2021, 9:52 AM

## 2021-04-12 NOTE — Progress Notes (Addendum)
Attempted In and Out cath per MD order due to unsuccessful foley placement by dayshift RN. Due to resistance, I was unable to successfully perform In and Out cath.  Reached out to [facility] to see if coudet team available for foley placement.

## 2021-04-12 NOTE — TOC Initial Note (Signed)
Transition of Care [facility]) - Initial/Assessment Note    Patient Details  Name: Max Ellis MRN: [id number] Date of Birth: 07-Apr-1934  [facility]) CM/SW Contact:    Harriet Masson, RN Phone [phone] 04/12/2021, 11:18 AM  Clinical Narrative:                 TOC requested to make a referral to [place]. RN contacted daughter [name]) concerning this referral (receptive). Called a referral for Tammy at [facility] who will have liaison to evaluate pt on Monday for hospice home in [place].   TOC will remain available for any ongoing needs.   Expected Discharge Plan: Home w Hospice Care Barriers to Discharge: Continued Medical Work up   Patient Goals and CMS Choice   CMS Medicare.gov Compare Post Acute Care list provided to:: Patient Represenative (must comment) Choice offered to / list presented to : Adult Children  Expected Discharge Plan and Services Expected Discharge Plan: [place] In-house Referral: Hospice / Palliative Care Discharge Planning Services: CM Consult Post Acute Care Choice: Hospice Living arrangements for the past 2 months: Single Family Home                 DME Arranged: Gilford Rile, Bedside commode, Wheelchair manual           HH Agency: [facility] Date HH Agency Contacted: 04/12/21 Time [place]: 52 Representative spoke with at [facility] (hospice home)  Prior Living Arrangements/Services Living arrangements for the past 2 months: [place] with:: Self Patient language and need for interpreter reviewed:: Yes Do you feel safe going back to the place where you live?: Yes      Need for Family Participation in Patient Care: Yes (Comment) Care giver support system in place?:  (son and daughter supportive) Current home services: DME ([facility], [facility]) Criminal Activity/Legal Involvement Pertinent to Current Situation/Hospitalization: No - Comment as  needed  Activities of Daily Living      Permission Sought/Granted Permission sought to share information with : Case Manager Permission granted to share information with : Yes, Verbal Permission Granted              Emotional Assessment Appearance:: Appears stated age     Orientation: : Oriented to Self, Oriented to Situation Alcohol / Substance Use: Not Applicable Psych Involvement: No (comment)  Admission diagnosis:  Adult failure to thrive [R62.7] Difficulty walking [R26.2] Acute decompensated heart failure ([place]) [I50.9] Altered mental status, unspecified altered mental status type [R41.82] Patient Active Problem List   Diagnosis Date Noted   Acute decompensated heart failure ([place]) 04/11/2021   Adult failure to thrive 04/11/2021   Acute anemia 03/18/2021   Fall 02/19/2021   Acute lower UTI 02/19/2021   COVID-19 virus infection 02/18/2021   NSTEMI (non-ST elevated myocardial infarction) ([place]) 02/17/2021   Hypertensive urgency 02/16/2021   Acute urinary retention 02/16/2021   Chronic kidney disease, stage 4 (severe) ([place]) 02/16/2021   Benign prostatic hyperplasia with incomplete bladder emptying 03/03/2020   Essential hypertension 01/26/2017   Tobacco use disorder 01/26/2017   Pain in limb 01/26/2017   PCP:  [place] Pink, MD Pharmacy:   [id number] [id number]: [phone]: [phone]    Social Determinants of Health (SDOH) Interventions    Readmission Risk Interventions Readmission Risk Prevention Plan 02/20/2021  Transportation Screening Complete  Palliative Care  Screening Complete  Medication Review (RN Care Manager) Complete  Some recent data might be hidden

## 2021-04-12 NOTE — Progress Notes (Addendum)
Patient voided once today.  Bladder scan showed >548m.  Per family he tries to void, but is unable to.  Dr. SPriscella Mannmade aware.  Per MD to insert a foley.   Unable to insert foley d/t resistance.  Dr. SPriscella Mannhas been notified.  Per MD to try in&out cath.  If unable to insert will contact coudet team if available.  Night RN made aware of the above.

## 2021-04-12 NOTE — Care Management (Signed)
Notified by bedside RN.  Bladder scan >510cc.  Foley placement attempted, unsuccessful.  RN to attempt in and out cath.  Will restart home proscar and flomax. If catheterization unsuccessful, will need to contact coudet team vs urology consult for difficult foley placement

## 2021-04-13 NOTE — Progress Notes (Signed)
04/13/21 1300  Clinical Encounter Type  Visited With Patient and family together  Visit Type Follow-up;Spiritual support;Social support  Referral From Nurse  Consult/Referral To Chaplain  Spiritual Encounters  Spiritual Needs Grief support  Daryel November met with family at Pt's bedside. Chaplain has an established relationship with family from a few months ago when Pt's spouse passed. Daryel November encouraged family in the sharing of stories and offered her continued support, urging family to alert nurse of any pastoral support needs throughout the day/night. Daryel November also encouraged their self-care in this time.

## 2021-04-13 NOTE — Progress Notes (Signed)
PROGRESS NOTE    Max Ellis  [id number] DOB: 29-Oct-1933 DOA: 04/11/2021 PCP: Maryland Pink, MD    Brief Narrative: 87 y.o. male with medical history significant of diastolic congestive heart failure, COPD, longtime and active smoker, recent admission for COVID infection, CKD stage IV, BPH, CAD on DAPT who presents to the ED for chief complaint of progressive weakness, poor p.o. intake, altered mentation.   Patient had a recent admission at [facility] last month.  At that time treated for COVID infection and discharged to skilled nursing facility.  After discharge from skilled nursing facility patient was doing well for few days at home however the family noted that he became increasingly weak, more confused, garbled speech, decreased p.o. intake.   On my evaluation patient is in bed.  He is a frail-appearing.  Shallow respirations.  Encephalopathic with garbled speech pattern.  Appears ill.  Unable to provide history.  History obtained by speaking with the family, reading the medical record, speaking with the ED physician.   Per the family the patient has declined over the last several months.  His wife of 50+ years passed away from liver cirrhosis approximately 4 months ago.  I discussed patient status and plan of care with patient's daughter over the phone.  After our conversation the family elected to proceed with full comfort measures.  All medications have been focused on patient comfort.   Assessment & Plan:   Active Problems:   Acute decompensated heart failure (HCC)   Adult failure to thrive  Acute metabolic encephalopathy Poor p.o. intake, suspect severe protein calorie malnutrition Inability to ambulate Adult failure to thrive Patient has had a significant decline over the past several months.  Recent recurrent hospital admissions.  Recent skilled nursing facility stay, COVID infection.  Now with adult failure to thrive associated with inability to ambulate, metabolic  encephalopathy, poor p.o. intake.  Patient with several advanced comorbidities. Plan: Patient has been transitioned to full comfort measures.  DNR status.  As needed morphine, Ativan, Robinul, Haldol.  Discontinue labs.  Discontinue telemetry monitoring.  Urinary retention Patient was retaining over 500 cc on 9/10 Foley catheter attempted bedside unsuccessful In-N-Out unsuccessful Daily team contacted overnight Coud catheter in place now Draining clear urine Plan: Continue Foley catheter   Acute decompensated diastolic congestive heart failure  COPD Chronic kidney disease stage IV Coronary artery disease Essential hypertension BPH Chronic metabolic acidosis   DVT prophylaxis: None, comfort care Code Status: DNR Family Communication: Left VM for daughter [name] [phone] on 9/11 Disposition Plan: Status is: Inpatient  Remains inpatient appropriate because:Altered mental status, Unsafe d/c plan, IV treatments appropriate due to intensity of illness or inability to take PO, and Inpatient level of care appropriate due to severity of illness  Dispo: The patient is from: Home              Anticipated d/c is to:  Home hospice versus inpatient hospice facility              Patient currently is not medically stable to d/c.   Difficult to place patient No       Level of care: Med-Surg  Consultants:  None  Procedures:  None  Antimicrobials:  None   Subjective: Seen and examined.  Resting comfortably in bed.  No visible distress  Objective: Vitals:   04/12/21 0600 04/12/21 0828 04/12/21 1636 04/13/21 0458  BP: (!) 144/83 (!) 148/54 (!) 160/55 (!) 166/52  Pulse: 71 63 67 (!) 58  Resp: 18  $'18 16 10  'o$ Temp: 97.9 F (36.6 C) (!) 97.1 F (36.2 C) (!) 97.5 F (36.4 C) 97.6 F (36.4 C)  TempSrc:      SpO2: 98% 96% 100% 100%  Weight:      Height:        Intake/Output Summary (Last 24 hours) at 04/13/2021 1023 Last data filed at 04/13/2021 0507 Gross per 24 hour   Intake --  Output 800 ml  Net -800 ml   Filed Weights   04/12/21 0018  Weight: 62.4 kg    Examination:  Limited exam due to comfort measure status  General exam: Sleeping in bed.  No visible distress.  Frail Respiratory system: Bilateral crackles.  Normal work of breathing.  Room air Cardiovascular system: S1-S2, RRR, no murmurs Gastrointestinal system: Thin, nontender, nondistended, normal bowel sounds    Data Reviewed: I have personally reviewed following labs and imaging studies  CBC: Recent Labs  Lab 04/11/21 1303  WBC 10.4  NEUTROABS 8.7*  HGB 11.7*  HCT 35.6*  MCV 93.7  PLT [id number]   Basic Metabolic Panel: Recent Labs  Lab 04/11/21 1303  NA 144  K 3.8  CL 116*  CO2 19*  GLUCOSE 101*  BUN 25*  CREATININE 2.51*  CALCIUM 8.7*   GFR: Estimated Creatinine Clearance: 19 mL/min (A) (by C-G formula based on SCr of 2.51 mg/dL (H)). Liver Function Tests: Recent Labs  Lab 04/11/21 1303  AST 42*  ALT 23  ALKPHOS 85  BILITOT 1.2  PROT 5.9*  ALBUMIN 2.8*   No results for input(s): LIPASE, AMYLASE in the last 168 hours. No results for input(s): AMMONIA in the last 168 hours. Coagulation Profile: No results for input(s): INR, PROTIME in the last 168 hours. Cardiac Enzymes: No results for input(s): CKTOTAL, CKMB, CKMBINDEX, TROPONINI in the last 168 hours. BNP (last 3 results) No results for input(s): PROBNP in the last 8760 hours. HbA1C: No results for input(s): HGBA1C in the last 72 hours. CBG: No results for input(s): GLUCAP in the last 168 hours. Lipid Profile: No results for input(s): CHOL, HDL, LDLCALC, TRIG, CHOLHDL, LDLDIRECT in the last 72 hours. Thyroid Function Tests: No results for input(s): TSH, T4TOTAL, FREET4, T3FREE, THYROIDAB in the last 72 hours. Anemia Panel: No results for input(s): VITAMINB12, FOLATE, FERRITIN, TIBC, IRON, RETICCTPCT in the last 72 hours. Sepsis Labs: Recent Labs  Lab 04/11/21 1303  LATICACIDVEN 1.6    Recent  Results (from the past 240 hour(s))  Resp Panel by RT-PCR (Flu A&B, Covid) Nasopharyngeal Swab     Status: Abnormal   Collection Time: 04/11/21  1:03 PM   Specimen: Nasopharyngeal Swab; Nasopharyngeal(NP) swabs in vial transport medium  Result Value Ref Range Status   SARS Coronavirus 2 by RT PCR POSITIVE (A) NEGATIVE Final    Comment: RESULT CALLED TO, READ BACK BY AND VERIFIED WITH: LAURA STEINFIELD 1423 [id number] (NOTE) SARS-CoV-2 target nucleic acids are DETECTED.  The SARS-CoV-2 RNA is generally detectable in upper respiratory specimens during the acute phase of infection. Positive results are indicative of the presence of the identified virus, but do not rule out bacterial infection or co-infection with other pathogens not detected by the test. Clinical correlation with patient history and other diagnostic information is necessary to determine patient infection status. The expected result is Negative.  Fact Sheet for Patients: EntrepreneurPulse.com.au  Fact Sheet for Healthcare Providers: IncredibleEmployment.be  This test is not yet approved or cleared by the [place] and  has been authorized for  detection and/or diagnosis of SARS-CoV-2 by FDA under an Emergency Use Authorization (EUA).  This EUA will remain in effect (meaning this test can be u sed) for the duration of  the COVID-19 declaration under Section 564(b)(1) of the Act, 21 U.S.C. section 360bbb-3(b)(1), unless the authorization is terminated or revoked sooner.     Influenza A by PCR NEGATIVE NEGATIVE Final   Influenza B by PCR NEGATIVE NEGATIVE Final    Comment: (NOTE) The Xpert Xpress SARS-CoV-2/FLU/RSV plus assay is intended as an aid in the diagnosis of influenza from Nasopharyngeal swab specimens and should not be used as a sole basis for treatment. Nasal washings and aspirates are unacceptable for Xpert Xpress SARS-CoV-2/FLU/RSV testing.  Fact Sheet for  Patients: EntrepreneurPulse.com.au  Fact Sheet for Healthcare Providers: IncredibleEmployment.be  This test is not yet approved or cleared by the [place] FDA and has been authorized for detection and/or diagnosis of SARS-CoV-2 by FDA under an Emergency Use Authorization (EUA). This EUA will remain in effect (meaning this test can be used) for the duration of the COVID-19 declaration under Section 564(b)(1) of the Act, 21 U.S.C. section 360bbb-3(b)(1), unless the authorization is terminated or revoked.  Performed at [facility], [address] [place], [place] [address]          Radiology Studies: DG Chest 2 View  Result Date: 04/11/2021 CLINICAL DATA:  Shortness of breath. EXAM: CHEST - 2 VIEW COMPARISON:  03/18/2021 FINDINGS: Stable cardiomediastinal contours. Persistent bilateral pleural effusions and mild interstitial edema concerning for CHF. Right apical pleuroparenchymal scarring as noted on recent chest CT is again seen and appears unchanged. No superimposed airspace consolidation. IMPRESSION: Pleural effusions and mild interstitial edema compatible with CHF. Electronically Signed   By: Kerby Moors M.D.   On: 04/11/2021 13:27   CT HEAD WO CONTRAST (5MM)  Result Date: 04/11/2021 CLINICAL DATA:  Altered mental status EXAM: CT HEAD WITHOUT CONTRAST TECHNIQUE: Contiguous axial images were obtained from the base of the skull through the vertex without intravenous contrast. COMPARISON:  CT head 02/19/2021 FINDINGS: Brain: There is no evidence of acute intracranial hemorrhage, extra-axial fluid collection, or infarct. There is moderate global parenchymal volume loss with enlargement of the ventricular system, similar to the prior study. 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 Extensive hypodensity throughout the remainder of the subcortical and periventricular white matter likely  reflects advanced chronic white matter microangiopathy. There is no mass lesion. There is no midline shift. Vascular: There is calcification of the bilateral cavernous ICAs and vertebral arteries. Skull: Normal. Negative for fracture or focal lesion. Sinuses/Orbits: The imaged paranasal sinuses are clear. A right lens implant is noted. The globes and orbits are otherwise unremarkable. Other: There is a small right mastoid effusion not seen on the prior study. IMPRESSION: 1. No acute intracranial pathology. 2. 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 3. New small left mastoid effusion. Electronically Signed   By: Valetta Mole M.D.   On: 04/11/2021 16:06        Scheduled Meds:  finasteride  5 mg Oral Daily   mouth rinse  15 mL Mouth Rinse BID   tamsulosin  0.4 mg Oral Daily   Continuous Infusions:   LOS: 2 days    Time spent: 15 minutes    Sidney Ace, MD [facility] Pager [phone]  If 7PM-7AM, please contact night-coverage 04/13/2021, 10:23 AM

## 2021-04-14 ENCOUNTER — Encounter: Payer: Self-pay | Admitting: Internal Medicine

## 2021-04-14 DIAGNOSIS — Z515 Encounter for palliative care: Secondary | ICD-10-CM

## 2021-04-14 DIAGNOSIS — Z7189 Other specified counseling: Secondary | ICD-10-CM

## 2021-04-14 DIAGNOSIS — R4182 Altered mental status, unspecified: Secondary | ICD-10-CM

## 2021-04-14 MED ORDER — MORPHINE SULFATE (CONCENTRATE) 10 MG/0.5ML PO SOLN: 5.0000 mg | ORAL | 0 refills | Status: AC | PRN

## 2021-04-14 MED ORDER — LORAZEPAM 2 MG/ML PO CONC: 1.0000 mg | ORAL | 0 refills | Status: DC | PRN

## 2021-04-14 MED ORDER — MORPHINE SULFATE (CONCENTRATE) 10 MG/0.5ML PO SOLN: 5.0000 mg | ORAL | 0 refills | Status: DC | PRN

## 2021-04-14 MED ORDER — LORAZEPAM 2 MG/ML PO CONC: 1.0000 mg | ORAL | 0 refills | Status: AC | PRN

## 2021-04-14 NOTE — Consult Note (Signed)
Consultation Note Date: 04/14/2021   Patient Name: Max Ellis  DOB: Nov 18, 1933  MRN: [id number]  Age / Sex: 87 y.o., male  PCP: Maryland Pink, MD Referring Physician: Sidney Ace, MD  Reason for Consultation: Establishing goals of care and Psychosocial/spiritual support  HPI/Patient Profile: 87 y.o. male  with past medical history of diastolic heart failure, COPD, longtime and active smoker, CKD 4, BPH, CAD on DAPT, recent admit for COVID, HTN admitted on 04/11/2021 with acute metabolic encephalopathy, malnutrition, failure to thrive.   Clinical Assessment and Goals of Care: I have reviewed medical records including EPIC notes, labs and imaging, received report from RN, assessed the patient.  Max Ellis, Max Ellis, is lying quietly in bed.  He greets me making and somewhat keeping eye contact.  He is alert, able to make his needs known.  His daughter, Max Ellis is at bedside.    I introduced Palliative Medicine as specialized medical care for people living with serious illness. It focuses on providing relief from the symptoms and stress of a serious illness. The goal is to improve quality of life for both the patient and the family.  We briefly discuss diagnosis prognosis, GOC, EOL wishes, disposition and options.  Patient and family have elected hospice care.  We talked about provider choice.  I encourage family to lean on hospice for any needs.  They are agreeable to discharge home today.  Equipment needs coordinated by transition of care.  Discussed the importance of continued conversation with family and the medical providers regarding overall plan of care and treatment options, ensuring decisions are within the context of the patient's values and GOCs.  Questions and concerns were addressed.  The family was encouraged to call with questions or concerns.  PMT will continue to support  holistically.  Conference with bedside nursing staff, transition of care, local hospice representative related to patient condition, needs, disposition.   HCPOA   NEXT OF KIN -daughter, Max Ellis    SUMMARY OF RECOMMENDATIONS   Home with the benefits of [facility] hospice. Considering do not rehospitalize.  Code Status/Advance Care Planning: DNR  Symptom Management:  Per hospitalist, no additional needs at this time.  Palliative Prophylaxis:  Frequent Pain Assessment, Oral Care, and Turn Reposition  Additional Recommendations (Limitations, Scope, Preferences): Home with hospice care through [facility]  Psycho-social/Spiritual:  Desire for further Chaplaincy support:no Additional Recommendations: Caregiving  Support/Resources and Education on Hospice  Prognosis:  < 3 months, would be anticipated based on chronic illness burden, poor functional status, family's and patient's desire to focus on hospice type care  Discharge Planning:  Home with [facility] hospice       Primary Diagnoses: Present on Admission:  Adult failure to thrive   I have reviewed the medical record, interviewed the patient and family, and examined the patient. The following aspects are pertinent.  Past Medical History:  Diagnosis Date   COPD (chronic obstructive pulmonary disease) ([place])    Hypertension    Peripheral vascular disease (HCC)    Renal disorder  Stage 4 chronic kidney disease ([place])    Social History   Socioeconomic History   Marital status: Widowed    Spouse name: Not on file   Number of children: Not on file   Years of education: Not on file   Highest education level: Not on file  Occupational History   Not on file  Tobacco Use   Smoking status: Every Day    Types: Cigarettes   Smokeless tobacco: Former    Types: Chew  Substance and Sexual Activity   Alcohol use: No   Drug use: No   Sexual activity: Not on file  Other Topics Concern   Not on file  Social History Narrative   Not  on file   Social Determinants of Health   Financial Resource Strain: Not on file  Food Insecurity: Not on file  Transportation Needs: Not on file  Physical Activity: Not on file  Stress: Not on file  Social Connections: Not on file   History reviewed. No pertinent family history. Scheduled Meds:  finasteride  5 mg Oral Daily   mouth rinse  15 mL Mouth Rinse BID   tamsulosin  0.4 mg Oral Daily   Continuous Infusions: PRN Meds:.acetaminophen **OR** acetaminophen, antiseptic oral rinse, diphenhydrAMINE, glycopyrrolate **OR** glycopyrrolate **OR** glycopyrrolate, haloperidol **OR** haloperidol **OR** haloperidol lactate, Ipratropium-Albuterol, LORazepam **OR** LORazepam **OR** LORazepam, LORazepam, magic mouthwash, morphine injection, morphine CONCENTRATE **OR** morphine CONCENTRATE, ondansetron **OR** ondansetron (ZOFRAN) IV, polyvinyl alcohol Medications Prior to Admission:  Prior to Admission medications   Medication Sig Start Date End Date Taking? Authorizing Provider  albuterol (VENTOLIN HFA) 108 (90 Base) MCG/ACT inhaler Inhale 2 puffs into the lungs every 6 (six) hours as needed. 03/25/21  Yes [provider]  amLODipine (NORVASC) 10 MG tablet Take 1 tablet (10 mg total) by mouth daily. Start taking half systolic BP greater than [id number] mmHg 03/21/21  Yes Val Riles, MD  atorvastatin (LIPITOR) 80 MG tablet Take 1 tablet (80 mg total) by mouth daily. 02/20/21  Yes Patrecia Pour, MD  calcitRIOL (ROCALTROL) 0.25 MCG capsule Take 0.25 mcg by mouth daily. 09/18/19  Yes [provider]  citalopram (CELEXA) 20 MG tablet Take 20 mg by mouth daily.   Yes [provider]  clopidogrel (PLAVIX) 75 MG tablet Take 1 tablet (75 mg total) by mouth daily. 02/20/21  Yes Patrecia Pour, MD  famotidine (PEPCID) 20 MG tablet Take 20 mg by mouth daily. 02/26/20 04/11/21 Yes [provider]  finasteride (PROSCAR) 5 MG tablet Take 5 mg by mouth daily. 12/30/16  Yes [provider]  furosemide (LASIX) 40 MG tablet Take 40 mg by mouth daily. 03/27/21  Yes [provider]  metoprolol tartrate (LOPRESSOR) 25 MG tablet Take 0.5 tablets (12.5 mg total) by mouth 2 (two) times daily. Skip dose if systolic BP less than [id number] mmHg and or HR less than 65 Patient taking differently: Take 25 mg by mouth 2 (two) times daily. 03/21/21  Yes Val Riles, MD  omeprazole (PRILOSEC) 20 MG capsule Take 20 mg by mouth at bedtime.   Yes [provider]  tamsulosin (FLOMAX) 0.4 MG CAPS capsule Take 0.8 mg by mouth daily.   Yes [provider]  traZODone (DESYREL) 150 MG tablet Take 150 mg by mouth at bedtime.   Yes [provider]  aspirin (ASPIRIN 81) 81 MG EC tablet Take 1 tablet (81 mg total) by mouth daily. Patient not taking: No sig reported 02/19/21   Patrecia Pour, MD  cholecalciferol (VITAMIN D3) 25 MCG (1000 UT) tablet Take 1,000 Units by mouth daily.    [provider]  Cyanocobalamin (B-12) 500 MCG TABS Take 1 tablet by mouth daily. Patient not taking: No sig reported 03/21/21 06/19/21  Val Riles, MD  Ipratropium-Albuterol (COMBIVENT) 20-100 MCG/ACT AERS respimat Inhale 1 puff into the lungs every 6 (six) hours as needed for wheezing. Patient not taking: No sig reported 03/03/21   Terrilee Croak, MD  iron polysaccharides (NIFEREX) 150 MG capsule Take 1 capsule (150 mg total) by mouth daily. Patient not taking: No sig reported 03/21/21 06/19/21  Val Riles, MD  latanoprost (XALATAN) 0.005 % ophthalmic solution Place 1 drop into both eyes at bedtime. Patient not taking: No sig reported 02/15/20   [provider]  LORazepam (ATIVAN) 2 MG/ML concentrated solution Place 0.5 mLs (1 mg total) under the tongue every 4 (four) hours as needed for up to 5 days for anxiety. 04/14/21 04/19/21  Sidney Ace, MD  Morphine Sulfate (MORPHINE CONCENTRATE) 10 MG/0.5ML SOLN concentrated solution Take 0.25 mLs (5 mg total) by mouth  every 2 (two) hours as needed for moderate pain (or dyspnea). 04/14/21 06/08/21  Sidney Ace, MD  sodium bicarbonate 650 MG tablet Take 1 tablet (650 mg total) by mouth 2 (two) times daily. Patient not taking: No sig reported 03/21/21 04/20/21  Val Riles, MD  vitamin C (ASCORBIC ACID) 500 MG tablet Take 0.5 tablets (250 mg total) by mouth daily. Patient not taking: No sig reported 03/21/21 06/19/21  Val Riles, MD   No Known Allergies Review of Systems  Unable to perform ROS: Age   Physical Exam Vitals and nursing note reviewed.  Constitutional:      General: He is not in acute distress.    Appearance: He is ill-appearing.  HENT:     Head: Normocephalic and atraumatic.  Cardiovascular:     Rate and Rhythm: Normal rate.  Pulmonary:     Effort: Pulmonary effort is normal. No tachypnea.  Musculoskeletal:     Right lower leg: No edema.     Left lower leg: No edema.  Skin:    General: Skin is warm and dry.  Neurological:     Mental Status: He is alert.  Psychiatric:        Mood and Affect: Mood normal.        Behavior: Behavior normal.    Vital Signs: BP (!) 146/51 (BP Location: Left Arm)   Pulse 74   Temp 98 F (36.7 C) (Oral)   Resp 14   Ht '6\' 2"'$  (1.88 m)   Wt 62.4 kg   SpO2 97%   BMI 17.66 kg/m  Pain Scale: PAINAD   Pain Score: Asleep   SpO2: SpO2: 97 % O2 Device:SpO2: 97 % O2 Flow Rate: .O2 Flow Rate (L/min): 2 L/min  IO: Intake/output summary:  Intake/Output Summary (Last 24 hours) at 04/14/2021 1241 Last data filed at 04/14/2021 [id number] Gross per 24 hour  Intake 1028 ml  Output 1220 ml  Net -192 ml    LBM: Last BM Date:  (PTA) Baseline Weight: Weight: 62.4 kg Most recent weight: Weight: 62.4 kg     Palliative Assessment/Data:   Flowsheet Rows    Flowsheet Row Most Recent Value  Intake Tab   Referral Department Hospitalist  Unit at Time of Referral Med/Surg Unit  Palliative Care Primary Diagnosis Cardiac  Date Notified 04/11/21   Palliative Care Type New Palliative care  Reason for referral Clarify Goals of Care  Date of Admission 04/11/21  Date first seen by Palliative Care 04/14/21  # of days Palliative referral response time 3 Day(s)  # of days IP prior to Palliative referral 0  Clinical Assessment   Palliative Performance Scale Score 30%  Pain Max last 24 hours Not able to report  Pain Min Last 24 hours Not able to report  Dyspnea Max Last 24 Hours Not able to report  Dyspnea Min Last 24 hours Not able to report  Psychosocial & Spiritual Assessment   Palliative Care Outcomes        Time In: 0810 Time Out: 0900 Time Total: 50 minutes  Greater than 50%  of this time was spent counseling and coordinating care related to the above assessment and plan.  Signed by: Drue Novel, NP   Please contact Palliative Medicine Team phone at [phone] for questions and concerns.  For individual provider: See Shea Evans

## 2021-04-14 NOTE — Discharge Summary (Signed)
Physician Discharge Summary  Max Ellis [id number] DOB: 1933/08/11 DOA: 04/11/2021  PCP: Maryland Pink, MD  Admit date: 04/11/2021 Discharge date: 04/14/2021  Admitted From: Home Disposition: Home with hospice  Recommendations for Outpatient Follow-up:  Follow recommendations of outpatient hospice provider  Home Health: No Equipment/Devices: None  Discharge Condition: Hospice CODE STATUS: DNR comfort care Diet recommendation: Dysphagia 2, comfort feeds  Brief/Interim Summary:  87 y.o. male with medical history significant of diastolic congestive heart failure, COPD, longtime and active smoker, recent admission for COVID infection, CKD stage IV, BPH, CAD on DAPT who presents to the ED for chief complaint of progressive weakness, poor p.o. intake, altered mentation.   Patient had a recent admission at [facility] last month.  At that time treated for COVID infection and discharged to skilled nursing facility.  After discharge from skilled nursing facility patient was doing well for few days at home however the family noted that he became increasingly weak, more confused, garbled speech, decreased p.o. intake.   On my evaluation patient is in bed.  He is a frail-appearing.  Shallow respirations.  Encephalopathic with garbled speech pattern.  Appears ill.  Unable to provide history.  History obtained by speaking with the family, reading the medical record, speaking with the ED physician.   Per the family the patient has declined over the last several months.  His wife of 50+ years passed away from liver cirrhosis approximately 4 months ago.   I discussed patient status and plan of care with patient's daughter over the phone.  After our conversation the family elected to proceed with full comfort measures.  All medications have been focused on patient comfort.  Evaluation requested by hospice liaison.  After another conversation patient was accepted to home with hospice services.  Discussed  with TOC.  Will discharge home.  Hospitalist to assume care.  As needed comfort medications sent to outpatient pharmacy.  Discharge Diagnoses:  Active Problems:   Acute decompensated heart failure ([place])   Adult failure to thrive   Hospice care patient   Palliative care patient  Acute metabolic encephalopathy Poor p.o. intake, suspect severe protein calorie malnutrition Inability to ambulate Adult failure to thrive Patient has had a significant decline over the past several months.  Recent recurrent hospital admissions.  Recent skilled nursing facility stay, COVID infection.  Now with adult failure to thrive associated with inability to ambulate, metabolic encephalopathy, poor p.o. intake.  Patient with several advanced comorbidities. Plan: Discharge home with hospice services.  DNR status.  As needed morphine and Ativan prescribed at time of discharge.  Care to be assumed by outpatient hospice providers.   Urinary retention Patient was retaining over 500 cc on 9/10 Foley catheter attempted bedside unsuccessful In-N-Out unsuccessful Daily team contacted overnight Coud catheter in place now Draining clear urine Plan: Can discharge with Foley catheter in place.  Defer discontinuation to hospice providers   Acute decompensated diastolic congestive heart failure  COPD Chronic kidney disease stage IV Coronary artery disease Essential hypertension BPH Chronic metabolic acidosis  Discharge Instructions  Discharge Instructions     Diet - low sodium heart healthy   Complete by: As directed    Increase activity slowly   Complete by: As directed       Allergies as of 04/14/2021   No Known Allergies      Medication List     STOP taking these medications    albuterol 108 (90 Base) MCG/ACT inhaler Commonly known as: VENTOLIN HFA  amLODipine 10 MG tablet Commonly known as: NORVASC   aspirin 81 MG EC tablet Commonly known as: Aspirin 81   atorvastatin 80 MG  tablet Commonly known as: LIPITOR   B-12 500 MCG Tabs   calcitRIOL 0.25 MCG capsule Commonly known as: ROCALTROL   cholecalciferol 25 MCG (1000 UNIT) tablet Commonly known as: VITAMIN D3   citalopram 20 MG tablet Commonly known as: CELEXA   clopidogrel 75 MG tablet Commonly known as: PLAVIX   famotidine 20 MG tablet Commonly known as: PEPCID   finasteride 5 MG tablet Commonly known as: PROSCAR   furosemide 40 MG tablet Commonly known as: LASIX   Ipratropium-Albuterol 20-100 MCG/ACT Aers respimat Commonly known as: COMBIVENT   iron polysaccharides 150 MG capsule Commonly known as: NIFEREX   latanoprost 0.005 % ophthalmic solution Commonly known as: XALATAN   metoprolol tartrate 25 MG tablet Commonly known as: LOPRESSOR   omeprazole 20 MG capsule Commonly known as: PRILOSEC   sodium bicarbonate 650 MG tablet   tamsulosin 0.4 MG Caps capsule Commonly known as: FLOMAX   traZODone 150 MG tablet Commonly known as: DESYREL   vitamin C 500 MG tablet Commonly known as: ASCORBIC ACID       TAKE these medications    LORazepam 2 MG/ML concentrated solution Commonly known as: ATIVAN Place 0.5 mLs (1 mg total) under the tongue every 4 (four) hours as needed for up to 5 days for anxiety.   morphine CONCENTRATE 10 MG/0.5ML Soln concentrated solution Take 0.25 mLs (5 mg total) by mouth every 2 (two) hours as needed for moderate pain (or dyspnea).        No Known Allergies  Consultations: None   Procedures/Studies: DG Chest 2 View  Result Date: 04/11/2021 CLINICAL DATA:  Shortness of breath. EXAM: CHEST - 2 VIEW COMPARISON:  03/18/2021 FINDINGS: Stable cardiomediastinal contours. Persistent bilateral pleural effusions and mild interstitial edema concerning for CHF. Right apical pleuroparenchymal scarring as noted on recent chest CT is again seen and appears unchanged. No superimposed airspace consolidation. IMPRESSION: Pleural effusions and mild interstitial  edema compatible with CHF. Electronically Signed   By: Kerby Moors M.D.   On: 04/11/2021 13:27   DG Chest 2 View  Result Date: 03/18/2021 CLINICAL DATA:  Weakness and shortness of breath. EXAM: CHEST - 2 VIEW COMPARISON:  02/19/2021 FINDINGS: The cardio pericardial silhouette is enlarged. Bibasilar atelectasis/infiltrate noted with small bilateral pleural effusions. Interstitial markings are diffusely coarsened with chronic features. Bones are diffusely demineralized. IMPRESSION: Bibasilar atelectasis/infiltrate with small bilateral pleural effusions. Electronically Signed   By: Misty Stanley M.D.   On: 03/18/2021 16:02   CT HEAD WO CONTRAST (5MM)  Result Date: 04/11/2021 CLINICAL DATA:  Altered mental status EXAM: CT HEAD WITHOUT CONTRAST TECHNIQUE: Contiguous axial images were obtained from the base of the skull through the vertex without intravenous contrast. COMPARISON:  CT head 02/19/2021 FINDINGS: Brain: There is no evidence of acute intracranial hemorrhage, extra-axial fluid collection, or infarct. There is moderate global parenchymal volume loss with enlargement of the ventricular system, similar to the prior study. 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 Extensive hypodensity throughout the remainder of the subcortical and periventricular white matter likely reflects advanced chronic white matter microangiopathy. There is no mass lesion. There is no midline shift. Vascular: There is calcification of the bilateral cavernous ICAs and vertebral arteries. Skull: Normal. Negative for fracture or focal lesion. Sinuses/Orbits: The imaged paranasal sinuses are clear. A right lens implant  is noted. The globes and orbits are otherwise unremarkable. Other: There is a small right mastoid effusion not seen on the prior study. IMPRESSION: 1. No acute intracranial pathology. 2. 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 3. New small left mastoid effusion. Electronically Signed   By: Valetta Mole M.D.   On: 04/11/2021 16:06   (Echo, Carotid, EGD, Colonoscopy, ERCP)    Subjective: Seen and examined the day of discharge.  No visible distress.  Can discharge home with hospice services.  Discharge Exam: Vitals:   04/13/21 0458 04/14/21 0531  BP: (!) 166/52 (!) 146/51  Pulse: (!) 58 74  Resp: 10 14  Temp: 97.6 F (36.4 C) 98 F (36.7 C)  SpO2: 100% 97%   Vitals:   04/12/21 0828 04/12/21 1636 04/13/21 0458 04/14/21 0531  BP: (!) 148/54 (!) 160/55 (!) 166/52 (!) 146/51  Pulse: 63 67 (!) 58 74  Resp: '18 16 10 14  '$ Temp: (!) 97.1 F (36.2 C) (!) 97.5 F (36.4 C) 97.6 F (36.4 C) 98 F (36.7 C)  TempSrc:    Oral  SpO2: 96% 100% 100% 97%  Weight:      Height:        General: Awake.  No apparent distress.  Frail Cardiovascular: RRR, S1/S2 +, no rubs, no gallops Respiratory: CTA bilaterally, no wheezing, no rhonchi Abdominal: Soft, NT, ND, bowel sounds + Extremities: no edema, no cyanosis    The results of significant diagnostics from this hospitalization (including imaging, microbiology, ancillary and laboratory) are listed below for reference.     Microbiology: Recent Results (from the past 240 hour(s))  Resp Panel by RT-PCR (Flu A&B, Covid) Nasopharyngeal Swab     Status: Abnormal   Collection Time: 04/11/21  1:03 PM   Specimen: Nasopharyngeal Swab; Nasopharyngeal(NP) swabs in vial transport medium  Result Value Ref Range Status   SARS Coronavirus 2 by RT PCR POSITIVE (A) NEGATIVE Final    Comment: RESULT CALLED TO, READ BACK BY AND VERIFIED WITH: LAURA STEINFIELD 1423 [id number] (NOTE) SARS-CoV-2 target nucleic acids are DETECTED.  The SARS-CoV-2 RNA is generally detectable in upper respiratory specimens during the acute phase of infection. Positive results are indicative of the presence of the identified virus, but do  not rule out bacterial infection or co-infection with other pathogens not detected by the test. Clinical correlation with patient history and other diagnostic information is necessary to determine patient infection status. The expected result is Negative.  Fact Sheet for Patients: EntrepreneurPulse.com.au  Fact Sheet for Healthcare Providers: IncredibleEmployment.be  This test is not yet approved or cleared by the [place] FDA and  has been authorized for detection and/or diagnosis of SARS-CoV-2 by FDA under an Emergency Use Authorization (EUA).  This EUA will remain in effect (meaning this test can be u sed) for the duration of  the COVID-19 declaration under Section 564(b)(1) of the Act, 21 U.S.C. section 360bbb-3(b)(1), unless the authorization is terminated or revoked sooner.     Influenza A by PCR NEGATIVE NEGATIVE Final   Influenza B by PCR NEGATIVE NEGATIVE Final    Comment: (NOTE) The Xpert Xpress SARS-CoV-2/FLU/RSV plus assay is intended as an aid in the diagnosis of influenza from Nasopharyngeal swab specimens and should not be used as a sole basis for treatment. Nasal washings and aspirates are unacceptable for Xpert Xpress SARS-CoV-2/FLU/RSV testing.  Fact Sheet for Patients: EntrepreneurPulse.com.au  Fact Sheet for Healthcare Providers:  IncredibleEmployment.be  This test is not yet approved or cleared by the [place] and has been authorized for detection and/or diagnosis of SARS-CoV-2 by FDA under an Emergency Use Authorization (EUA). This EUA will remain in effect (meaning this test can be used) for the duration of the COVID-19 declaration under Section 564(b)(1) of the Act, 21 U.S.C. section 360bbb-3(b)(1), unless the authorization is terminated or revoked.  Performed at [facility], [address] [place], [place] [address]      Labs: BNP (last 3  results) Recent Labs    04/11/21 1303  BNP 123XX123*   Basic Metabolic Panel: Recent Labs  Lab 04/11/21 1303  NA 144  K 3.8  CL 116*  CO2 19*  GLUCOSE 101*  BUN 25*  CREATININE 2.51*  CALCIUM 8.7*   Liver Function Tests: Recent Labs  Lab 04/11/21 1303  AST 42*  ALT 23  ALKPHOS 85  BILITOT 1.2  PROT 5.9*  ALBUMIN 2.8*   No results for input(s): LIPASE, AMYLASE in the last 168 hours. No results for input(s): AMMONIA in the last 168 hours. CBC: Recent Labs  Lab 04/11/21 1303  WBC 10.4  NEUTROABS 8.7*  HGB 11.7*  HCT 35.6*  MCV 93.7  PLT 150   Cardiac Enzymes: No results for input(s): CKTOTAL, CKMB, CKMBINDEX, TROPONINI in the last 168 hours. BNP: Invalid input(s): POCBNP CBG: No results for input(s): GLUCAP in the last 168 hours. D-Dimer No results for input(s): DDIMER in the last 72 hours. Hgb A1c No results for input(s): HGBA1C in the last 72 hours. Lipid Profile No results for input(s): CHOL, HDL, LDLCALC, TRIG, CHOLHDL, LDLDIRECT in the last 72 hours. Thyroid function studies No results for input(s): TSH, T4TOTAL, T3FREE, THYROIDAB in the last 72 hours.  Invalid input(s): FREET3 Anemia work up No results for input(s): VITAMINB12, FOLATE, FERRITIN, TIBC, IRON, RETICCTPCT in the last 72 hours. Urinalysis    Component Value Date/Time   COLORURINE YELLOW 04/11/2021 1710   APPEARANCEUR CLEAR (A) 04/11/2021 1710   APPEARANCEUR Clear 01/02/2021 1358   LABSPEC 1.025 04/11/2021 1710   PHURINE 5.5 04/11/2021 1710   GLUCOSEU NEGATIVE 04/11/2021 1710   HGBUR NEGATIVE 04/11/2021 1710   BILIRUBINUR NEGATIVE 04/11/2021 1710   BILIRUBINUR Negative 01/02/2021 1358   KETONESUR NEGATIVE 04/11/2021 1710   PROTEINUR 100 (A) 04/11/2021 1710   NITRITE NEGATIVE 04/11/2021 1710   LEUKOCYTESUR NEGATIVE 04/11/2021 1710   Sepsis Labs Invalid input(s): PROCALCITONIN,  WBC,  LACTICIDVEN Microbiology Recent Results (from the past 240 hour(s))  Resp Panel by RT-PCR  (Flu A&B, Covid) Nasopharyngeal Swab     Status: Abnormal   Collection Time: 04/11/21  1:03 PM   Specimen: Nasopharyngeal Swab; Nasopharyngeal(NP) swabs in vial transport medium  Result Value Ref Range Status   SARS Coronavirus 2 by RT PCR POSITIVE (A) NEGATIVE Final    Comment: RESULT CALLED TO, READ BACK BY AND VERIFIED WITH: LAURA STEINFIELD 1423 [id number] (NOTE) SARS-CoV-2 target nucleic acids are DETECTED.  The SARS-CoV-2 RNA is generally detectable in upper respiratory specimens during the acute phase of infection. Positive results are indicative of the presence of the identified virus, but do not rule out bacterial infection or co-infection with other pathogens not detected by the test. Clinical correlation with patient history and other diagnostic information is necessary to determine patient infection status. The expected result is Negative.  Fact Sheet for Patients: EntrepreneurPulse.com.au  Fact Sheet for Healthcare Providers: IncredibleEmployment.be  This test is not yet approved or cleared by the  [place] Architectural technologist and  has been authorized for detection and/or diagnosis of SARS-CoV-2 by FDA under an Print production planner (EUA).  This EUA will remain in effect (meaning this test can be u sed) for the duration of  the COVID-19 declaration under Section 564(b)(1) of the Act, 21 U.S.C. section 360bbb-3(b)(1), unless the authorization is terminated or revoked sooner.     Influenza A by PCR NEGATIVE NEGATIVE Final   Influenza B by PCR NEGATIVE NEGATIVE Final    Comment: (NOTE) The Xpert Xpress SARS-CoV-2/FLU/RSV plus assay is intended as an aid in the diagnosis of influenza from Nasopharyngeal swab specimens and should not be used as a sole basis for treatment. Nasal washings and aspirates are unacceptable for Xpert Xpress SARS-CoV-2/FLU/RSV testing.  Fact Sheet for Patients: EntrepreneurPulse.com.au  Fact  Sheet for Healthcare Providers: IncredibleEmployment.be  This test is not yet approved or cleared by the [place] FDA and has been authorized for detection and/or diagnosis of SARS-CoV-2 by FDA under an Emergency Use Authorization (EUA). This EUA will remain in effect (meaning this test can be used) for the duration of the COVID-19 declaration under Section 564(b)(1) of the Act, 21 U.S.C. section 360bbb-3(b)(1), unless the authorization is terminated or revoked.  Performed at [facility], [address], [place], [place] [address]      Time coordinating discharge: Over 30 minutes  SIGNED:   Sidney Ace, MD  [facility] 04/14/2021, 10:53 AM Pager   If 7PM-7AM, please contact night-coverage

## 2021-04-14 NOTE — TOC Transition Note (Signed)
Transition of Care [facility]) - CM/SW Discharge Note   Patient Details  Name: Max Ellis MRN: [id number] Date of Birth: 1933-12-12  Transition of Care [facility]) CM/SW Contact:  Shelbie Hutching, RN Phone Number: 04/14/2021, 11:13 AM   Clinical Narrative:    Patient has been accepted by [facility].  Patient is medically cleared for discharge home today with hospice.  Sonia Baller with Hospice is aware of DC today, family at the bedside and in agreement to DC with hospice today.  Family requests that transport be after 3pm so the nephew can be there when patient arrives.  RNCM has arranged EMS transport with [facility] for 3pm.     Final next level of care: Home w Hospice Care Barriers to Discharge: Barriers Resolved   Patient Goals and CMS Choice   CMS Medicare.gov Compare Post Acute Care list provided to:: Patient Represenative (must comment) Choice offered to / list presented to : Adult Children  Discharge Placement                Patient to be transferred to facility by: Patient to be transported home Via [facility]   Patient and family notified of of transfer: 04/14/21  Discharge Plan and Services In-house Referral: Hospice / Palliative Care Discharge Planning Services: CM Consult Post Acute Care Choice: Hospice          DME Arranged: Gilford Rile, Bedside commode, Wheelchair manual         HH Arranged: NA HH Agency: [facility] Date HH Agency Contacted: 04/14/21 Time [place]: 1112 Representative spoke with at [place]: [place]) Interventions     Readmission Risk Interventions Readmission Risk Prevention Plan 02/20/2021  Transportation Screening Complete  Palliative Care Screening Complete  Medication Review Press photographer) Complete  Some recent data might be hidden

## 2021-04-14 NOTE — Care Management Important Message (Signed)
Important Message  Patient Details  Name: Max Ellis MRN: [id number] Date of Birth: September 11, 1933   Medicare Important Message Given:  Other (see comment)  On comfort care measures.  Medicare IM withheld at this time out of respect for patient and family.    Dannette Barbara 04/14/2021, 8:58 AM

## 2021-04-14 NOTE — Plan of Care (Signed)
  Problem: Pain Managment: Goal: General experience of comfort will improve Outcome: Progressing   Problem: Safety: Goal: Ability to remain free from injury will improve Outcome: Progressing   

## 2021-04-14 NOTE — Progress Notes (Addendum)
[facility]) Hospital Liaison Note  Received request from Transitions of Care Manager Raina Mina, RN for hospice services at home after discharge. Chart and patient information reviewed by [facility] physician. Hospice eligibility confirmed.  Spoke with daughter Max Ellis to initiate education related to hospice philosophy, services and team approach to care. Patient/family verbalized understanding of information provided. Per discussion, the plan for discharge is via EMS on 9.12.22.  DME needs discussed. Patient has the following equipment in the home: walker and BSC. Patient/family requests the following equipment for delivery: hospital bed and wheelchair. Address has been verified and is correct in the chart. Max Ellis [phone] is the family contact to arrange time of equipment delivery.   Please send signed and completed DNR home with patient/family. Please provide prescriptions at discharge as needed to ensure ongoing symptom management.   ACC information and contact numbers given to family. Above information shared with Doran Clay, RN [facility] Manager.   Please do not hesitate to call with any hospice related questions or concerns.   Thank you for the opportunity to participate in this patient's care.   Max "Loren Racer, RN, BSN [facility] Liaison [phone]

## 2021-04-14 NOTE — Progress Notes (Signed)
PROGRESS NOTE    Max Ellis  [id number] DOB: [birth year] DOA: 04/11/2021 PCP: Maryland Pink, MD    Brief Narrative: 87 y.o. male with medical history significant of diastolic congestive heart failure, COPD, longtime and active smoker, recent admission for COVID infection, CKD stage IV, BPH, CAD on DAPT who presents to the ED for chief complaint of progressive weakness, poor p.o. intake, altered mentation.   Patient had a recent admission at [facility] last month.  At that time treated for COVID infection and discharged to skilled nursing facility.  After discharge from skilled nursing facility patient was doing well for few days at home however the family noted that he became increasingly weak, more confused, garbled speech, decreased p.o. intake.   On my evaluation patient is in bed.  He is a frail-appearing.  Shallow respirations.  Encephalopathic with garbled speech pattern.  Appears ill.  Unable to provide history.  History obtained by speaking with the family, reading the medical record, speaking with the ED physician.   Per the family the patient has declined over the last several months.  His wife of 50+ years passed away from liver cirrhosis approximately 4 months ago.  I discussed patient status and plan of care with patient's daughter over the phone.  After our conversation the family elected to proceed with full comfort measures.  All medications have been focused on patient comfort.   Assessment & Plan:   Active Problems:   Acute decompensated heart failure (HCC)   Adult failure to thrive  Acute metabolic encephalopathy Poor p.o. intake, suspect severe protein calorie malnutrition Inability to ambulate Adult failure to thrive Patient has had a significant decline over the past several months.  Recent recurrent hospital admissions.  Recent skilled nursing facility stay, COVID infection.  Now with adult failure to thrive associated with inability to ambulate, metabolic  encephalopathy, poor p.o. intake.  Patient with several advanced comorbidities. Plan: Patient has been transitioned to full comfort measures.  DNR status.  As needed morphine, Ativan, Robinul, Haldol.  Discontinue labs.  Discontinue telemetry monitoring.  Energy level and mentation has improved.  Urinary retention Patient was retaining over 500 cc on 9/10 Foley catheter attempted bedside unsuccessful In-N-Out unsuccessful Daily team contacted overnight Coud catheter in place now Draining clear urine Plan: Continue Foley catheter   Acute decompensated diastolic congestive heart failure  COPD Chronic kidney disease stage IV Coronary artery disease Essential hypertension BPH Chronic metabolic acidosis   DVT prophylaxis: None, comfort care Code Status: DNR Family Communication: daughter [name] [phone] on 9/11, patient's niece at bedside 9/12 Disposition Plan: Status is: Inpatient  Remains inpatient appropriate because:Altered mental status, Unsafe d/c plan, IV treatments appropriate due to intensity of illness or inability to take PO, and Inpatient level of care appropriate due to severity of illness  Dispo: The patient is from: Home              Anticipated d/c is to:  Home hospice versus inpatient hospice facility              Patient currently is not medically stable to d/c.   Difficult to place patient No   Patient remains on comfort measures.  Pending consultation with hospice liaison.  If patient is accepted to hospice at home I prepared to discharge patient was today.    Level of care: Med-Surg  Consultants:  None  Procedures:  None  Antimicrobials:  None   Subjective: Seen and examined.  Resting comfortably in bed.  No  visible distress  Objective: Vitals:   04/12/21 0828 04/12/21 1636 04/13/21 0458 04/14/21 0531  BP: (!) 148/54 (!) 160/55 (!) 166/52 (!) 146/51  Pulse: 63 67 (!) 58 74  Resp: '18 16 10 14  '$ Temp: (!) 97.1 F (36.2 C) (!) 97.5 F  (36.4 C) 97.6 F (36.4 C) 98 F (36.7 C)  TempSrc:    Oral  SpO2: 96% 100% 100% 97%  Weight:      Height:        Intake/Output Summary (Last 24 hours) at 04/14/2021 1001 Last data filed at 04/14/2021 [id number] Gross per 24 hour  Intake 1028 ml  Output 1220 ml  Net -192 ml   Filed Weights   04/12/21 0018  Weight: 62.4 kg    Examination:  Limited exam due to comfort measure status  General exam: Sleeping in bed.  No visible distress.  Frail Respiratory system: Bilateral crackles.  Normal work of breathing.  Room air Cardiovascular system: S1-S2, RRR, no murmurs Gastrointestinal system: Thin, nontender, nondistended, normal bowel sounds    Data Reviewed: I have personally reviewed following labs and imaging studies  CBC: Recent Labs  Lab 04/11/21 1303  WBC 10.4  NEUTROABS 8.7*  HGB 11.7*  HCT 35.6*  MCV 93.7  PLT [id number]   Basic Metabolic Panel: Recent Labs  Lab 04/11/21 1303  NA 144  K 3.8  CL 116*  CO2 19*  GLUCOSE 101*  BUN 25*  CREATININE 2.51*  CALCIUM 8.7*   GFR: Estimated Creatinine Clearance: 19 mL/min (A) (by C-G formula based on SCr of 2.51 mg/dL (H)). Liver Function Tests: Recent Labs  Lab 04/11/21 1303  AST 42*  ALT 23  ALKPHOS 85  BILITOT 1.2  PROT 5.9*  ALBUMIN 2.8*   No results for input(s): LIPASE, AMYLASE in the last 168 hours. No results for input(s): AMMONIA in the last 168 hours. Coagulation Profile: No results for input(s): INR, PROTIME in the last 168 hours. Cardiac Enzymes: No results for input(s): CKTOTAL, CKMB, CKMBINDEX, TROPONINI in the last 168 hours. BNP (last 3 results) No results for input(s): PROBNP in the last 8760 hours. HbA1C: No results for input(s): HGBA1C in the last 72 hours. CBG: No results for input(s): GLUCAP in the last 168 hours. Lipid Profile: No results for input(s): CHOL, HDL, LDLCALC, TRIG, CHOLHDL, LDLDIRECT in the last 72 hours. Thyroid Function Tests: No results for input(s): TSH, T4TOTAL,  FREET4, T3FREE, THYROIDAB in the last 72 hours. Anemia Panel: No results for input(s): VITAMINB12, FOLATE, FERRITIN, TIBC, IRON, RETICCTPCT in the last 72 hours. Sepsis Labs: Recent Labs  Lab 04/11/21 1303  LATICACIDVEN 1.6    Recent Results (from the past 240 hour(s))  Resp Panel by RT-PCR (Flu A&B, Covid) Nasopharyngeal Swab     Status: Abnormal   Collection Time: 04/11/21  1:03 PM   Specimen: Nasopharyngeal Swab; Nasopharyngeal(NP) swabs in vial transport medium  Result Value Ref Range Status   SARS Coronavirus 2 by RT PCR POSITIVE (A) NEGATIVE Final    Comment: RESULT CALLED TO, READ BACK BY AND VERIFIED WITH: LAURA STEINFIELD 1423 [id number] (NOTE) SARS-CoV-2 target nucleic acids are DETECTED.  The SARS-CoV-2 RNA is generally detectable in upper respiratory specimens during the acute phase of infection. Positive results are indicative of the presence of the identified virus, but do not rule out bacterial infection or co-infection with other pathogens not detected by the test. Clinical correlation with patient history and other diagnostic information is necessary to determine patient infection status. The expected  result is Negative.  Fact Sheet for Patients: EntrepreneurPulse.com.au  Fact Sheet for Healthcare Providers: IncredibleEmployment.be  This test is not yet approved or cleared by the [place] FDA and  has been authorized for detection and/or diagnosis of SARS-CoV-2 by FDA under an Emergency Use Authorization (EUA).  This EUA will remain in effect (meaning this test can be u sed) for the duration of  the COVID-19 declaration under Section 564(b)(1) of the Act, 21 U.S.C. section 360bbb-3(b)(1), unless the authorization is terminated or revoked sooner.     Influenza A by PCR NEGATIVE NEGATIVE Final   Influenza B by PCR NEGATIVE NEGATIVE Final    Comment: (NOTE) The Xpert Xpress SARS-CoV-2/FLU/RSV plus assay is intended as  an aid in the diagnosis of influenza from Nasopharyngeal swab specimens and should not be used as a sole basis for treatment. Nasal washings and aspirates are unacceptable for Xpert Xpress SARS-CoV-2/FLU/RSV testing.  Fact Sheet for Patients: EntrepreneurPulse.com.au  Fact Sheet for Healthcare Providers: IncredibleEmployment.be  This test is not yet approved or cleared by the [place] FDA and has been authorized for detection and/or diagnosis of SARS-CoV-2 by FDA under an Emergency Use Authorization (EUA). This EUA will remain in effect (meaning this test can be used) for the duration of the COVID-19 declaration under Section 564(b)(1) of the Act, 21 U.S.C. section 360bbb-3(b)(1), unless the authorization is terminated or revoked.  Performed at [facility], [address], [place], [place] [address]          Radiology Studies: No results found.      Scheduled Meds:  finasteride  5 mg Oral Daily   mouth rinse  15 mL Mouth Rinse BID   tamsulosin  0.4 mg Oral Daily   Continuous Infusions:   LOS: 3 days    Time spent: 15 minutes    Sidney Ace, MD [facility] Pager [phone]  If 7PM-7AM, please contact night-coverage 04/14/2021, 10:01 AM

## 2021-04-16 ENCOUNTER — Telehealth: Payer: Self-pay | Admitting: Oncology

## 2021-04-16 NOTE — Telephone Encounter (Signed)
Called patient to reschedule his 9/22 Lung nodule clinic appointment and was informed by his daughter that he is under comfort care with Hospice.  Daughter requested that all appointments be canceled.  Janina Mayo RN and [place] RT notified.

## 2021-04-17 ENCOUNTER — Ambulatory Visit: Admission: RE | Admit: 2021-04-17 | Payer: Medicare Other | Source: Ambulatory Visit

## 2021-04-24 ENCOUNTER — Ambulatory Visit: Payer: Medicare Other | Admitting: Nurse Practitioner

## 2021-05-03 DEATH — deceased

## 2021-10-22 IMAGING — CT CT CHEST-ABD-PELV W/O CM
2 of 5 series · 10 of 36 positions shown, 15 images · non-contrast
Comparison: 04/15/2020

CLINICAL DATA: Chest pain, dyspnea, unspecified abdominal pain

EXAM:
CT CHEST, ABDOMEN AND PELVIS WITHOUT CONTRAST
TECHNIQUE: Multidetector CT imaging of the chest, abdomen and pelvis was
performed following the standard protocol without IV contrast.

[Series 4: cap wo st · axial · 0.83mm/px · z∈[-1018,-528]mm · 7 of 132 slices shown, 12 images]
[im 17/132  mediastinal]
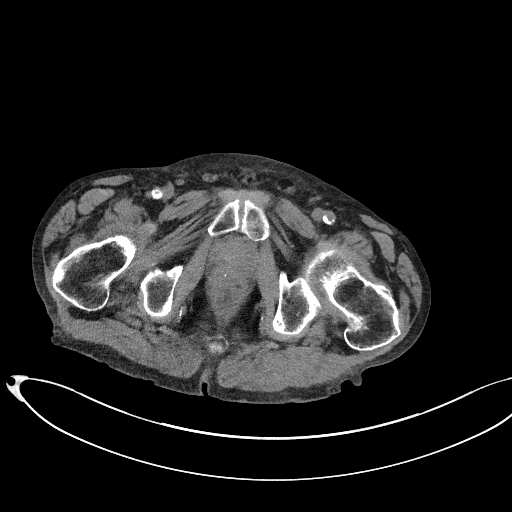
[im 17/132  bone]
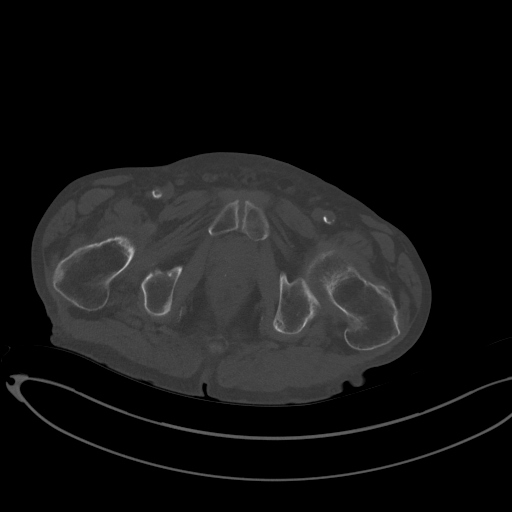
[im 33/132  mediastinal]
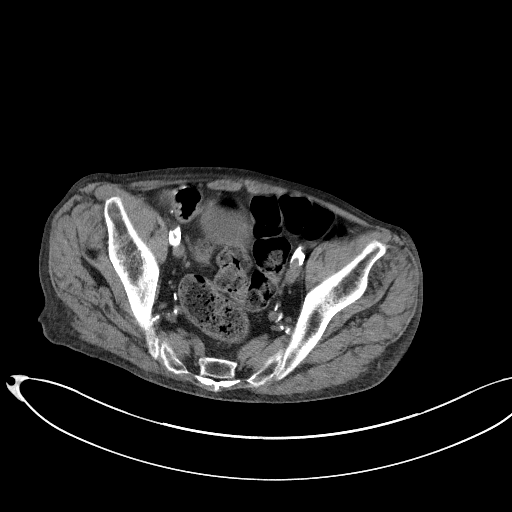
[im 50/132  mediastinal]
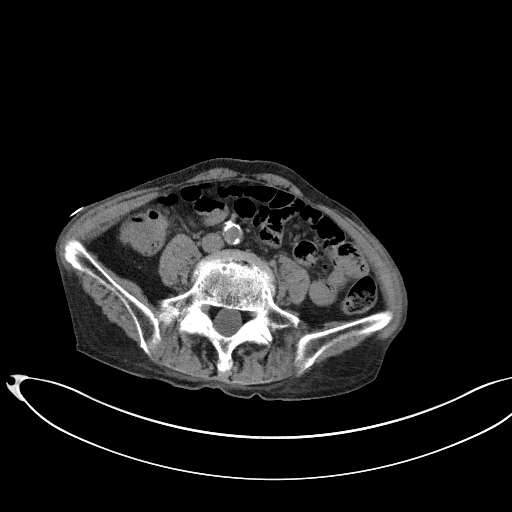
[im 66/132  mediastinal]
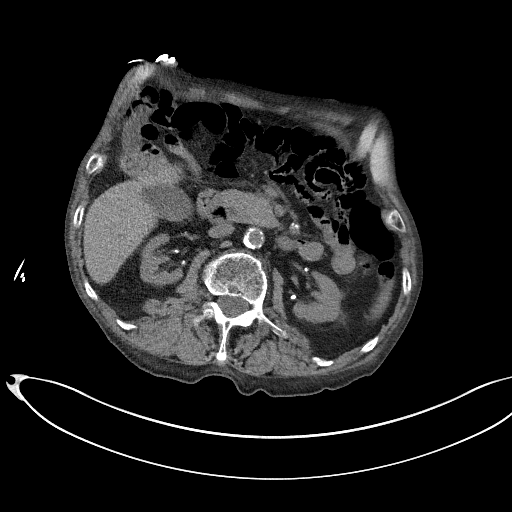
[im 66/132  lung]
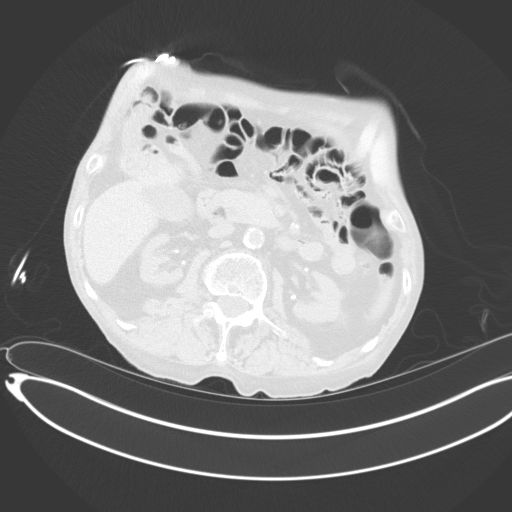
[im 82/132  mediastinal]
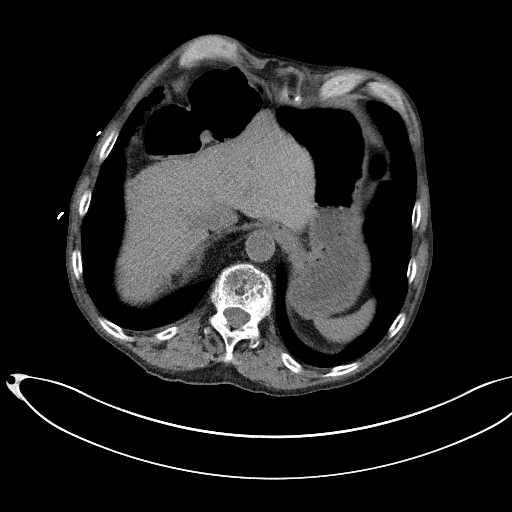
[im 82/132  lung]
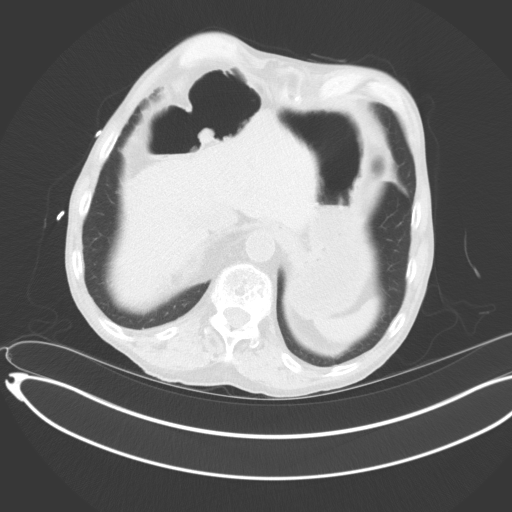
[im 99/132  mediastinal]
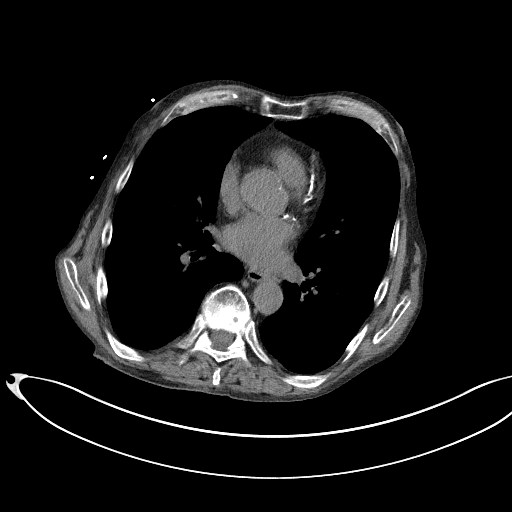
[im 99/132  lung]
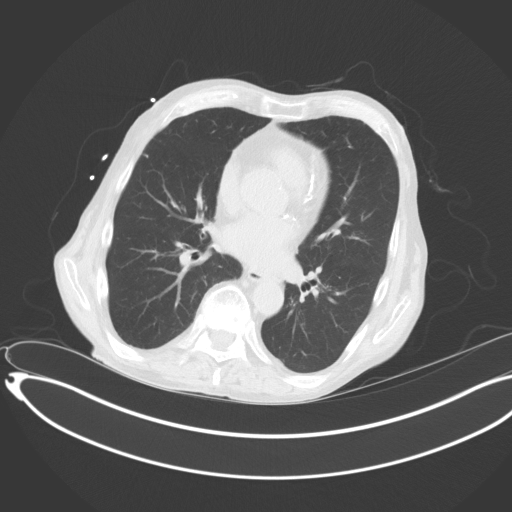
[im 115/132  mediastinal]
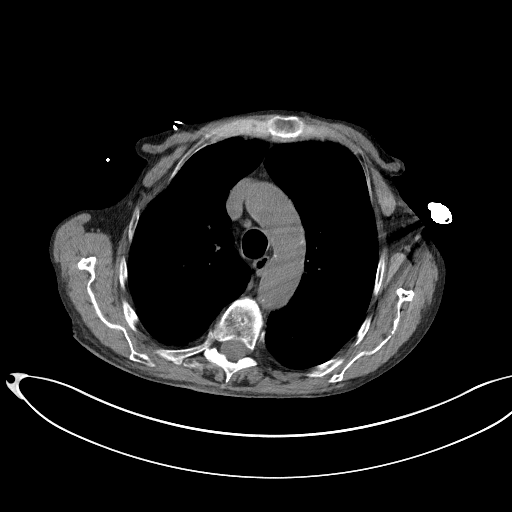
[im 115/132  lung]
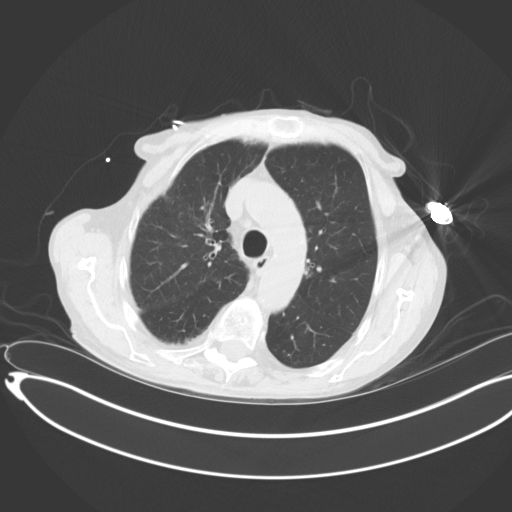

[Series 6: coronal · coronal · 0.73mm/px · 3 of 158 slices shown]
[im 32/158  mediastinal]
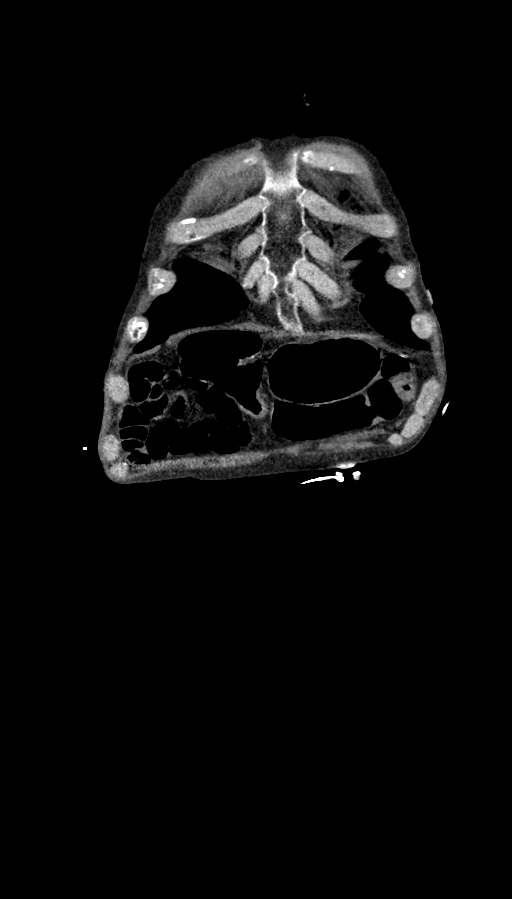
[im 63/158  mediastinal]
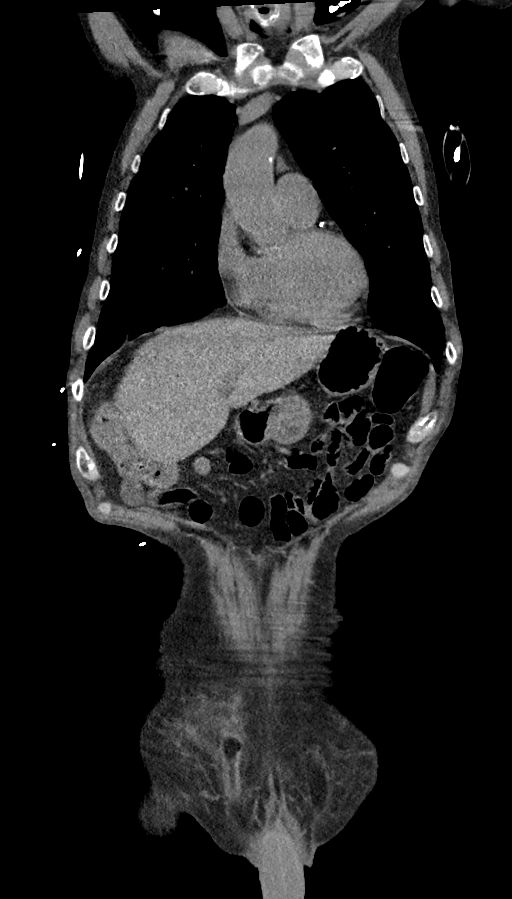
[im 95/158  mediastinal]
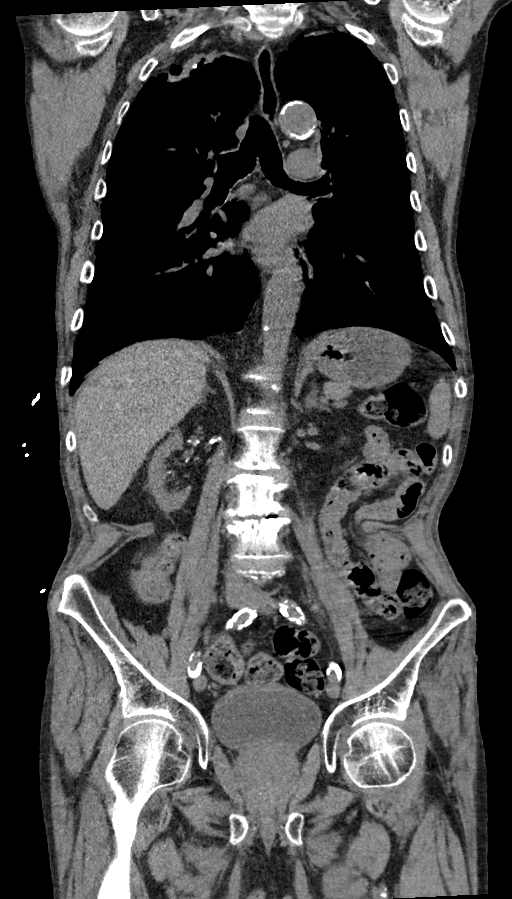

[10 of 36 positions shown; findings below may reference images not displayed]

FINDINGS: CT CHEST FINDINGS

Cardiovascular: Extensive multi-vessel coronary artery
calcification. Global cardiac size within normal limits. No
pericardial effusion. Central pulmonary arteries are of normal
caliber. Moderate atherosclerotic calcification within the thoracic
aorta. No aortic aneurysm.

Mediastinum/Nodes: No pathologic thoracic adenopathy. Visualized
thyroid is unremarkable. The esophagus appears patulous, but is
otherwise unremarkable.

Lungs/Pleura: Asymmetric right apical scarring, likely post
infectious or post inflammatory in nature, appears stable. 5-6 mm
subpleural pulmonary nodule within the right upper lobe, axial image
# 78/5, is stable. Additional scattered 2-3 mm pulmonary nodules
throughout the lungs bilaterally are also stable. No new focal
pulmonary nodules or infiltrates. No pneumothorax or pleural
effusion. Central airways are widely patent.

Musculoskeletal: Multiple mild anterior wedge deformities of the
midthoracic spine with associated mild thoracic dextroscoliosis is
stable since prior examination. No acute bone abnormality. No lytic
or blastic bone lesion.

CT ABDOMEN PELVIS FINDINGS

Hepatobiliary: No focal liver abnormality is seen. No gallstones,
gallbladder wall thickening, or biliary dilatation.

Pancreas: Unremarkable

Spleen: Unremarkable

Adrenals/Urinary Tract: The adrenal glands are unremarkable. The
kidneys are normal in position. The kidneys are mildly atrophic
bilaterally, stable since prior examination. Vascular calcifications
are noted within the renal hila bilaterally. No hydronephrosis. No
urinary calculi. No perinephric fluid collections are seen. The
bladder is unremarkable.

Stomach/Bowel: Stomach is within normal limits. Appendix appears
normal. No evidence of bowel wall thickening, distention, or
inflammatory changes. No free intraperitoneal gas or fluid.

Vascular/Lymphatic: Extensive aortoiliac atherosclerotic
calcification. Particularly prominent atherosclerotic calcification
noted within the lower extremity arterial inflow and visualized
outflow. No aortic aneurysm. No pathologic adenopathy within the
abdomen and pelvis.

Reproductive: Prostate is unremarkable.

Other: No abdominal wall hernia.  Rectum unremarkable.

Musculoskeletal: Degenerative changes are seen within the lumbar
spine. No acute bone abnormality. No lytic or blastic bone lesion
identified.
IMPRESSION: No acute intrathoracic or intra-abdominal pathology identified. No
definite radiographic explanation for the patient's reported
symptoms.

Extensive multi-vessel coronary artery calcification.

Stable asymmetric right apical pulmonary tumefactive scarring,
likely the result of remote infection or inflammation.

Stable scattered pulmonary nodules within the lungs bilaterally,
with a dominant nodule noted within the right upper lobe measuring
5-6 mm. These are stable since remote prior examination of
01/11/2019 and are safely considered benign.

Peripheral vascular disease. If there is clinical evidence of lower
extremity arterial insufficiency, CT arteriography may be more
helpful for further evaluation.

Aortic Atherosclerosis ([id number]-K54.4).
